# Patient Record
Sex: Female | Born: 1950 | Race: Black or African American | Hispanic: No | Marital: Single | Smoking: Never smoker
Health system: Southern US, Community
[De-identification: ages and names within clinical notes are randomized; demographics above are authoritative.]

## PROBLEM LIST (undated history)

## (undated) DIAGNOSIS — E119 Type 2 diabetes mellitus without complications: Secondary | ICD-10-CM

## (undated) DIAGNOSIS — I10 Essential (primary) hypertension: Secondary | ICD-10-CM

## (undated) DIAGNOSIS — I639 Cerebral infarction, unspecified: Secondary | ICD-10-CM

## (undated) DIAGNOSIS — J45909 Unspecified asthma, uncomplicated: Secondary | ICD-10-CM

## (undated) DIAGNOSIS — E785 Hyperlipidemia, unspecified: Secondary | ICD-10-CM

## (undated) HISTORY — PX: POLYPECTOMY: SHX149

## (undated) HISTORY — PX: LEG SURGERY: SHX1003

## (undated) HISTORY — DX: Hyperlipidemia, unspecified: E78.5

## (undated) HISTORY — PX: COLONOSCOPY: SHX174

## (undated) HISTORY — DX: Unspecified asthma, uncomplicated: J45.909

## (undated) HISTORY — DX: Type 2 diabetes mellitus without complications: E11.9

---

## 2011-11-13 DIAGNOSIS — I639 Cerebral infarction, unspecified: Secondary | ICD-10-CM

## 2011-11-13 HISTORY — DX: Cerebral infarction, unspecified: I63.9

## 2012-05-07 ENCOUNTER — Emergency Department (HOSPITAL_COMMUNITY): Payer: Self-pay

## 2012-05-07 ENCOUNTER — Encounter (HOSPITAL_COMMUNITY): Payer: Self-pay | Admitting: *Deleted

## 2012-05-07 DIAGNOSIS — Z8673 Personal history of transient ischemic attack (TIA), and cerebral infarction without residual deficits: Secondary | ICD-10-CM | POA: Insufficient documentation

## 2012-05-07 DIAGNOSIS — R1310 Dysphagia, unspecified: Secondary | ICD-10-CM | POA: Insufficient documentation

## 2012-05-07 DIAGNOSIS — I10 Essential (primary) hypertension: Secondary | ICD-10-CM | POA: Insufficient documentation

## 2012-05-07 DIAGNOSIS — R51 Headache: Secondary | ICD-10-CM | POA: Insufficient documentation

## 2012-05-07 DIAGNOSIS — R0602 Shortness of breath: Secondary | ICD-10-CM | POA: Insufficient documentation

## 2012-05-07 DIAGNOSIS — R059 Cough, unspecified: Secondary | ICD-10-CM | POA: Insufficient documentation

## 2012-05-07 DIAGNOSIS — R05 Cough: Secondary | ICD-10-CM | POA: Insufficient documentation

## 2012-05-07 NOTE — ED Notes (Signed)
Lung sounds diminished, no wheezing heard, no hx of asthma.

## 2012-05-07 NOTE — ED Notes (Signed)
Pt c/o SOB and son states pt had a "stroke on Sunday" and was discharged yesterday.  Pt c/o difficulty swallowing, coughing with drinking/eating which pt states she had in hospital as well.  From stroke, some slurring of speech and left sided facial droop, this is not new-occurring since Sunday.  Alert and oriented x 3.

## 2012-05-08 ENCOUNTER — Emergency Department (HOSPITAL_COMMUNITY)
Admission: EM | Admit: 2012-05-08 | Discharge: 2012-05-08 | Disposition: A | Discharge status: Home / Self Care | Payer: Self-pay | Attending: Emergency Medicine | Admitting: Emergency Medicine

## 2012-05-08 ENCOUNTER — Emergency Department (HOSPITAL_COMMUNITY): Payer: Self-pay

## 2012-05-08 DIAGNOSIS — Z8673 Personal history of transient ischemic attack (TIA), and cerebral infarction without residual deficits: Secondary | ICD-10-CM

## 2012-05-08 DIAGNOSIS — T17998A Other foreign object in respiratory tract, part unspecified causing other injury, initial encounter: Secondary | ICD-10-CM

## 2012-05-08 HISTORY — DX: Cerebral infarction, unspecified: I63.9

## 2012-05-08 HISTORY — DX: Essential (primary) hypertension: I10

## 2012-05-08 LAB — BASIC METABOLIC PANEL
BUN: 22 mg/dL (ref 6–23)
Calcium: 9.7 mg/dL (ref 8.4–10.5)
Creatinine, Ser: 0.86 mg/dL (ref 0.50–1.10)
GFR calc non Af Amer: 71 mL/min — ABNORMAL LOW (ref >90)
Glucose, Bld: 95 mg/dL (ref 70–99)

## 2012-05-08 LAB — CBC
HCT: 40.7 % (ref 36.0–46.0)
Hemoglobin: 14 g/dL (ref 12.0–15.0)
MCH: 28 pg (ref 26.0–34.0)
MCHC: 34.4 g/dL (ref 30.0–36.0)

## 2012-05-08 MED ORDER — ALBUTEROL SULFATE HFA 108 (90 BASE) MCG/ACT IN AERS
2.0000 | INHALATION_SPRAY | RESPIRATORY_TRACT | Status: DC | PRN
  Administered 2012-05-08: 2 via RESPIRATORY_TRACT
  Filled 2012-05-08: qty 6.7

## 2012-05-08 NOTE — Discharge Instructions (Signed)
Aspiration Precautions Start thickened liquid diet as discussed. Followup and primary care and neurology office as discussed. Continue medications as prescribed. Return here or be evaluated sooner for any fevers trouble breathing or any worsening condition.   Aspiration is the inhaling of a liquid or object into the lungs. Things that can be inhaled into the lungs include:  Food.   Any type of liquid, such as drinks or saliva.   Stomach contents, such as vomit or stomach acid.  When these things go into the lungs, damage can occur. Serious complications can then result, such as:  A lung infection (pneumonia).   A collection of pus in the lungs (lung abscess).  CAUSES  A decreased level of awareness (consciousness) due to:   Traumatic brain injury or head injury.   Stroke.   Neurological disease.   Seizures.   Decreased or absent gag reflex (inability to cough).   Medical conditions that affect swallowing.   Conditions that affect the food pipe (esophagus) such as a narrowing of the esophagus (esophageal stricture).   Gastroesophageal reflux (GERD). This is also known as acid reflux.   Any type of surgery where you are put under general anesthesia or have sedation.   Drinking large amounts of alcohol.   Taking medication that causes drowsiness, confusion, or weakness.   Aging.   Dental problems.   Having a feeding tube.  SYMPTOMS When aspiration occurs, different signs and symptoms can occur, such as:  Coughing (if a person has a cough or gag reflex) after swallowing food or liquids.   Difficulty breathing. This can include things like:   Breathing rapidly.   Breathing very slowly.   Hearing "gurgling" lung sounds when a person breaths.   Coughing up phlegm (sputum) that is:   Yellow, tan, or green in color.   Has pieces of food in it.   Bad smelling.   A change in voice (hoarseness).   A change in skin color. The skin may turn a "bluish" type color  because of a lack of oxygen (cyanosis).   Fever.  DIAGNOSIS  A chest X-ray may be performed. This takes a picture of your lungs. It can show changes in the lungs if aspiration has occurred.   A bronchoscopy may be performed. This is a surgical procedure in which a thin, flexible tube with a camera at the end is inserted into the nose or mouth. The tube is advanced to the lungs so your caregiver can view the lungs and obtain a culture, tissue sample, or remove an aspirated object.   A swallowing evaluation study may be performed to evaluate:   A person's risk of aspiration.   How difficult it is for a person to swallow.   What types of foods are safe for a person to eat.  PREVENTION If you are a caregiver to someone who may aspirate, follow the directions below. If you are caring for someone who can eat and drink through their mouth:  Have them sit in an upright position when eating food or drinking fluids, such as:   Sitting up in a chair.   If sitting in a chair is not possible, position the person in bed so they are upright.   Remind the person to eat slowly and chew well.   Do not distract the person. This is especially important for people with thinking or memory (cognitive) problems.   Check the person's mouth for leftover food after eating.   Keep the person sitting  upright for 30 to 45 minutes after eating.   Do not serve food or drink for at least 2 hours before bedtime.  If you are caring for someone with a feeding tube and he or she cannot eat or drink through their mouth:  Keep the person in an upright position as much as possible.   Do not  lay the person flat if they are getting continuous feedings. Turn the feeding pump off if you need to lay the person flat for any reason.   Check feeding tube residuals as directed by your caregiver. If a large amount of tube feedings are pulled back (aspirated) from the feeding tube, call your caregiver right away.  General  guidelines to prevent aspiration include:  Feed small amounts of food. Do not force feed.   Use as little water as possible when brushing the person's teeth or cleaning his or her mouth.   Provide oral care before and after meals.   Never put food or fluids in the mouth of a person who is not fully alert.   Crush pills and put them in soft food such as pudding or ice cream. Some pills should not be crushed. Check with your caregiver before crushing any medication.  SEEK IMMEDIATE MEDICAL CARE IF:   The person has trouble breathing or starts to breathe rapidly.   The person is breathing very slowly or stops breathing.   The person coughs a lot after eating or drinking.   The person has a chronic cough.   The person coughs up thick, yellow, or tan sputum.   The person has a fever or persistent symptoms for more than 72 hours.   The person has a fever and their symptoms suddenly get worse.  Document Released: 12/01/2010 Document Revised: 10/18/2011 Document Reviewed: 12/01/2010 [facility] Patient Information 2012 [place], [place].Aspiration Precautions Aspiration is the inhaling of a liquid or object into the lungs. Things that can be inhaled into the lungs include:  Food.   Any type of liquid, such as drinks or saliva.   Stomach contents, such as vomit or stomach acid.  When these things go into the lungs, damage can occur. Serious complications can then result, such as:  A lung infection (pneumonia).   A collection of pus in the lungs (lung abscess).  CAUSES  A decreased level of awareness (consciousness) due to:   Traumatic brain injury or head injury.   Stroke.   Neurological disease.   Seizures.   Decreased or absent gag reflex (inability to cough).   Medical conditions that affect swallowing.   Conditions that affect the food pipe (esophagus) such as a narrowing of the esophagus (esophageal stricture).   Gastroesophageal reflux (GERD). This is also known as  acid reflux.   Any type of surgery where you are put under general anesthesia or have sedation.   Drinking large amounts of alcohol.   Taking medication that causes drowsiness, confusion, or weakness.   Aging.   Dental problems.   Having a feeding tube.  SYMPTOMS When aspiration occurs, different signs and symptoms can occur, such as:  Coughing (if a person has a cough or gag reflex) after swallowing food or liquids.   Difficulty breathing. This can include things like:   Breathing rapidly.   Breathing very slowly.   Hearing "gurgling" lung sounds when a person breaths.   Coughing up phlegm (sputum) that is:   Yellow, tan, or green in color.   Has pieces of food in it.  Bad smelling.   A change in voice (hoarseness).   A change in skin color. The skin may turn a "bluish" type color because of a lack of oxygen (cyanosis).   Fever.  DIAGNOSIS  A chest X-ray may be performed. This takes a picture of your lungs. It can show changes in the lungs if aspiration has occurred.   A bronchoscopy may be performed. This is a surgical procedure in which a thin, flexible tube with a camera at the end is inserted into the nose or mouth. The tube is advanced to the lungs so your caregiver can view the lungs and obtain a culture, tissue sample, or remove an aspirated object.   A swallowing evaluation study may be performed to evaluate:   A person's risk of aspiration.   How difficult it is for a person to swallow.   What types of foods are safe for a person to eat.  PREVENTION If you are a caregiver to someone who may aspirate, follow the directions below. If you are caring for someone who can eat and drink through their mouth:  Have them sit in an upright position when eating food or drinking fluids, such as:   Sitting up in a chair.   If sitting in a chair is not possible, position the person in bed so they are upright.   Remind the person to eat slowly and chew well.     Do not distract the person. This is especially important for people with thinking or memory (cognitive) problems.   Check the person's mouth for leftover food after eating.   Keep the person sitting upright for 30 to 45 minutes after eating.   Do not serve food or drink for at least 2 hours before bedtime.  If you are caring for someone with a feeding tube and he or she cannot eat or drink through their mouth:  Keep the person in an upright position as much as possible.   Do not  lay the person flat if they are getting continuous feedings. Turn the feeding pump off if you need to lay the person flat for any reason.   Check feeding tube residuals as directed by your caregiver. If a large amount of tube feedings are pulled back (aspirated) from the feeding tube, call your caregiver right away.  General guidelines to prevent aspiration include:  Feed small amounts of food. Do not force feed.   Use as little water as possible when brushing the person's teeth or cleaning his or her mouth.   Provide oral care before and after meals.   Never put food or fluids in the mouth of a person who is not fully alert.   Crush pills and put them in soft food such as pudding or ice cream. Some pills should not be crushed. Check with your caregiver before crushing any medication.  SEEK IMMEDIATE MEDICAL CARE IF:   The person has trouble breathing or starts to breathe rapidly.   The person is breathing very slowly or stops breathing.   The person coughs a lot after eating or drinking.   The person has a chronic cough.   The person coughs up thick, yellow, or tan sputum.   The person has a fever or persistent symptoms for more than 72 hours.   The person has a fever and their symptoms suddenly get worse.  Document Released: 12/01/2010 Document Revised: 10/18/2011 Document Reviewed: 12/01/2010 [facility] Patient Information 2012 [place], [place].
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
## 2012-05-08 NOTE — ED Notes (Signed)
D/c instructions reviewed w/ pt and family - pt and family deny any further questions or concerns at present. Pt ambulating independently w/ steady gait on d/c in no acute distress, A&Ox4.  

## 2012-05-11 ENCOUNTER — Emergency Department (HOSPITAL_BASED_OUTPATIENT_CLINIC_OR_DEPARTMENT_OTHER)
Admission: EM | Admit: 2012-05-11 | Discharge: 2012-05-11 | Disposition: A | Discharge status: Home / Self Care | Payer: Self-pay | Attending: Emergency Medicine | Admitting: Emergency Medicine

## 2012-05-11 ENCOUNTER — Encounter (HOSPITAL_BASED_OUTPATIENT_CLINIC_OR_DEPARTMENT_OTHER): Payer: Self-pay | Admitting: *Deleted

## 2012-05-11 ENCOUNTER — Emergency Department (HOSPITAL_BASED_OUTPATIENT_CLINIC_OR_DEPARTMENT_OTHER): Payer: Self-pay

## 2012-05-11 DIAGNOSIS — R062 Wheezing: Secondary | ICD-10-CM | POA: Insufficient documentation

## 2012-05-11 DIAGNOSIS — I10 Essential (primary) hypertension: Secondary | ICD-10-CM | POA: Insufficient documentation

## 2012-05-11 DIAGNOSIS — Z8673 Personal history of transient ischemic attack (TIA), and cerebral infarction without residual deficits: Secondary | ICD-10-CM | POA: Insufficient documentation

## 2012-05-11 LAB — PRO B NATRIURETIC PEPTIDE: Pro B Natriuretic peptide (BNP): 100.5 pg/mL (ref 0–125)

## 2012-05-11 LAB — COMPREHENSIVE METABOLIC PANEL
Albumin: 4.2 g/dL (ref 3.5–5.2)
Alkaline Phosphatase: 79 U/L (ref 39–117)
BUN: 11 mg/dL (ref 6–23)
Calcium: 9.2 mg/dL (ref 8.4–10.5)
Potassium: 4 mEq/L (ref 3.5–5.1)
Sodium: 138 mEq/L (ref 135–145)
Total Protein: 8 g/dL (ref 6.0–8.3)

## 2012-05-11 LAB — CBC WITH DIFFERENTIAL/PLATELET
Basophils Relative: 0 % (ref 0–1)
Eosinophils Absolute: 1.9 10*3/uL — ABNORMAL HIGH (ref 0.0–0.7)
MCH: 27.4 pg (ref 26.0–34.0)
MCHC: 34 g/dL (ref 30.0–36.0)
Neutrophils Relative %: 51 % (ref 43–77)
Platelets: 217 10*3/uL (ref 150–400)

## 2012-05-11 MED ORDER — IPRATROPIUM BROMIDE 0.02 % IN SOLN
0.5000 mg | Freq: Once | RESPIRATORY_TRACT | Status: AC
  Administered 2012-05-11: 0.5 mg via RESPIRATORY_TRACT

## 2012-05-11 MED ORDER — ACETAMINOPHEN 325 MG PO TABS
650.0000 mg | ORAL_TABLET | Freq: Once | ORAL | Status: AC
  Administered 2012-05-11: 650 mg via ORAL

## 2012-05-11 MED ORDER — ALBUTEROL SULFATE (5 MG/ML) 0.5% IN NEBU
5.0000 mg | INHALATION_SOLUTION | Freq: Once | RESPIRATORY_TRACT | Status: AC
  Administered 2012-05-11: 5 mg via RESPIRATORY_TRACT

## 2012-05-11 MED ORDER — ALBUTEROL SULFATE HFA 108 (90 BASE) MCG/ACT IN AERS
INHALATION_SPRAY | RESPIRATORY_TRACT | Status: AC
  Administered 2012-05-11: 18:00:00
  Filled 2012-05-11: qty 6.7

## 2012-05-11 MED ORDER — IPRATROPIUM BROMIDE 0.02 % IN SOLN
RESPIRATORY_TRACT | Status: AC
  Filled 2012-05-11: qty 2.5

## 2012-05-11 MED ORDER — ALBUTEROL SULFATE (5 MG/ML) 0.5% IN NEBU
INHALATION_SOLUTION | RESPIRATORY_TRACT | Status: AC
  Administered 2012-05-11: 5 mg via RESPIRATORY_TRACT
  Filled 2012-05-11: qty 1

## 2012-05-11 MED ORDER — METHYLPREDNISOLONE SODIUM SUCC 125 MG IJ SOLR
125.0000 mg | Freq: Once | INTRAMUSCULAR | Status: AC
  Administered 2012-05-11: 125 mg via INTRAVENOUS
  Filled 2012-05-11: qty 2

## 2012-05-11 MED ORDER — ACETAMINOPHEN 325 MG PO TABS
ORAL_TABLET | ORAL | Status: AC
  Administered 2012-05-11: 650 mg via ORAL
  Filled 2012-05-11: qty 2

## 2012-05-11 MED ORDER — PREDNISONE 10 MG PO TABS: 20.0000 mg | ORAL_TABLET | Freq: Every day | ORAL | Status: DC

## 2012-05-11 NOTE — ED Notes (Signed)
Pt states she was dx'd with a CVA last Sunday. D/C'd Tuesday and having trouble breathing since. Presents in moderate resp distress. Audible wheezing. Taken to ED10. [name], RRT in and Dr. Rosalia Hammers advised and in to evaluate.

## 2012-05-11 NOTE — Discharge Instructions (Signed)
Bronchospasm  A bronchospasm is when the tubes that carry air in and out of your lungs (bronchioles) become smaller. It is hard to breathe when this happens. A bronchospasm can be caused by:   Asthma.   Allergies.   Lung infection.  HOME CARE    Do not  smoke. Avoid places that have secondhand smoke.   Dust your house often. Have your air ducts cleaned once or twice a year.   Find out what allergies may cause your bronchospasms.   Use your inhaler properly if you have one. Know when to use it.   Eat healthy foods and drink plenty of water.   Only take medicine as told by your doctor.  GET HELP RIGHT AWAY IF:   You feel you cannot breathe or catch your breath.   You cannot stop coughing.   Your treatment is not helping you breathe better.  MAKE SURE YOU:    Understand these instructions.   Will watch your condition.   Will get help right away if you are not doing well or get worse.  Document Released: 08/26/2009 Document Revised: 10/18/2011 Document Reviewed: 08/26/2009  [employer] Patient Information 2012 [employer].
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
## 2012-05-14 ENCOUNTER — Encounter: Payer: Self-pay | Admitting: Family

## 2012-05-14 ENCOUNTER — Ambulatory Visit (INDEPENDENT_AMBULATORY_CARE_PROVIDER_SITE_OTHER): Payer: Self-pay | Admitting: Family

## 2012-05-14 VITALS — BP 156/100 | HR 86 | Temp 97.9°F | Resp 16 | Ht 62.5 in | Wt 205.1 lb

## 2012-05-14 DIAGNOSIS — I10 Essential (primary) hypertension: Secondary | ICD-10-CM

## 2012-05-14 DIAGNOSIS — Z8673 Personal history of transient ischemic attack (TIA), and cerebral infarction without residual deficits: Secondary | ICD-10-CM

## 2012-05-14 DIAGNOSIS — R05 Cough: Secondary | ICD-10-CM | POA: Insufficient documentation

## 2012-05-14 MED ORDER — OLMESARTAN MEDOXOMIL 20 MG PO TABS: 20.0000 mg | ORAL_TABLET | Freq: Every day | ORAL | Status: DC

## 2012-05-14 MED ORDER — FAMOTIDINE 20 MG PO TABS: 20.0000 mg | ORAL_TABLET | Freq: Two times a day (BID) | ORAL | Status: DC

## 2012-05-14 NOTE — Assessment & Plan Note (Signed)
Pt has persistent dysarthria and mild left sided facial weakness. She reports extensive work up (will request hospitalization records), which she tells me included a formal swallow evaluation.  She tells me that she passed the swallow test prior to discharge.  Since returning home, she reports getting "choked up on solids."  Will refer to speech therapy for re-evaluation.  In the meantime, I have advised her and the daughter to thicken liquids with thick-it to a nectar consistence and to eat soft foods.  Continue plavix.

## 2012-05-14 NOTE — Patient Instructions (Addendum)
You will be contacted about your referral to neurology and speech therapy. Follow up in 2-3 weeks.
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
## 2012-05-14 NOTE — Assessment & Plan Note (Signed)
Uncontrolled.  Will d/c ace due to cough.  Start Benicar 20mg - samples provided.  Will plan to check bmet next visit.  Obtain lab work from recent hospitalization. She is planning to apply for disability and medicare which hopefully can offset some of her medical costs as she is currently uninsured.

## 2012-05-14 NOTE — Assessment & Plan Note (Addendum)
Likely multifactorial.  I think she is having trouble clearing some of the upper secretions from her airway since the stroke.  Also, I suspect associated ACE cough.  Will add empiric pepcid to see if this helps with cough.  Continue prednisone and prn albuterol.

## 2012-05-16 ENCOUNTER — Other Ambulatory Visit (HOSPITAL_COMMUNITY): Payer: Self-pay | Admitting: Family

## 2012-05-16 ENCOUNTER — Telehealth: Payer: Self-pay | Admitting: Family

## 2012-05-16 DIAGNOSIS — R1310 Dysphagia, unspecified: Secondary | ICD-10-CM

## 2012-05-16 DIAGNOSIS — R05 Cough: Secondary | ICD-10-CM

## 2012-05-16 NOTE — Telephone Encounter (Signed)
Received medical records from [facility])  P: [phone] ext. [phone] F: [phone]

## 2012-05-22 ENCOUNTER — Ambulatory Visit (HOSPITAL_COMMUNITY)
Admission: RE | Admit: 2012-05-22 | Discharge: 2012-05-22 | Disposition: A | Discharge status: Home / Self Care | Payer: Self-pay | Source: Ambulatory Visit | Attending: Family | Admitting: Family

## 2012-05-22 ENCOUNTER — Telehealth: Payer: Self-pay | Admitting: Family

## 2012-05-22 DIAGNOSIS — I10 Essential (primary) hypertension: Secondary | ICD-10-CM | POA: Insufficient documentation

## 2012-05-22 DIAGNOSIS — R05 Cough: Secondary | ICD-10-CM

## 2012-05-22 DIAGNOSIS — R059 Cough, unspecified: Secondary | ICD-10-CM | POA: Insufficient documentation

## 2012-05-22 DIAGNOSIS — Z8673 Personal history of transient ischemic attack (TIA), and cerebral infarction without residual deficits: Secondary | ICD-10-CM

## 2012-05-22 DIAGNOSIS — I69991 Dysphagia following unspecified cerebrovascular disease: Secondary | ICD-10-CM | POA: Insufficient documentation

## 2012-05-22 DIAGNOSIS — R1311 Dysphagia, oral phase: Secondary | ICD-10-CM | POA: Insufficient documentation

## 2012-05-22 DIAGNOSIS — R1310 Dysphagia, unspecified: Secondary | ICD-10-CM
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
## 2012-05-22 NOTE — Telephone Encounter (Signed)
Pls call pt and let her know that her swallow test shows some mild swallowing difficulty.  The speech therapist has recommended the following, which they may have already reviewed with the pt:  Diet Recommendation Dysphagia 2 (Fine chop);Thin liquid  Liquid Administration via: Cup;No straw  Medication Administration: Whole meds with puree  Supervision: Patient able to self feed  Compensations: Slow rate;Small sips/bites  Postural Changes and/or Swallow Maneuvers: Seated upright 90 degrees

## 2012-05-26 NOTE — Telephone Encounter (Signed)
Left message to have pt return my call. 

## 2012-05-28 ENCOUNTER — Encounter: Payer: Self-pay | Admitting: Family

## 2012-05-28 ENCOUNTER — Ambulatory Visit (INDEPENDENT_AMBULATORY_CARE_PROVIDER_SITE_OTHER): Payer: Self-pay | Admitting: Family

## 2012-05-28 VITALS — BP 132/84 | HR 84 | Temp 98.1°F | Resp 16 | Ht 62.5 in | Wt 203.0 lb

## 2012-05-28 DIAGNOSIS — I10 Essential (primary) hypertension: Secondary | ICD-10-CM

## 2012-05-28 DIAGNOSIS — R1310 Dysphagia, unspecified: Secondary | ICD-10-CM

## 2012-05-28 DIAGNOSIS — Z8673 Personal history of transient ischemic attack (TIA), and cerebral infarction without residual deficits: Secondary | ICD-10-CM

## 2012-05-28 LAB — BASIC METABOLIC PANEL
BUN: 22 mg/dL (ref 6–23)
Potassium: 4.3 mEq/L (ref 3.5–5.3)
Sodium: 138 mEq/L (ref 135–145)

## 2012-05-28 NOTE — Patient Instructions (Addendum)
Please complete your blood work prior to leaving.  You will be contact about your referral to speech therapy and neurology. Please let us know if you have not heard back within 1 week about your referral. Please schedule a follow up appointment in 3 months.    Dysphagia diet-  Diet Recommendation Dysphagia 2 (Fine chop);Thin liquid  Liquid Administration via: Cup;No straw  Medication Administration: Whole meds with puree  Supervision: Patient able to self feed  Compensations: Slow rate;Small sips/bites  Postural Changes and/or Swallow Maneuvers: Seated upright 90 degrees
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
## 2012-05-28 NOTE — Assessment & Plan Note (Signed)
Clinically stable. Awaiting records.  I would like to see what her lipids look like to determine if she needs a statin. Continue plavix. Neuro consult pending.

## 2012-05-28 NOTE — Assessment & Plan Note (Signed)
Improved on Beniar.  #28 samples provided today.  Obtain BMET. Will initiate patient assistance.

## 2012-05-28 NOTE — Assessment & Plan Note (Signed)
She is tolerating dysphagia diet.  She is to start outpatient Speech Therapy.

## 2012-05-29 ENCOUNTER — Telehealth: Payer: Self-pay | Admitting: Family

## 2012-05-29 NOTE — Telephone Encounter (Signed)
Message copied by Sandford Craze on Thu May 29, 2012  3:12 PM ------      Message from: Darral Dash E      Created: Thu May 29, 2012  9:25 AM          Appt  :  [facility] . Dr Marjory Lies   July   23 @ 10            ----- Message -----         From: Sandford Craze, NP         Sent: 05/28/2012  11:26 AM           To: Keith Rake Aheron            Also, could you pls check on status of neurology referral.  thanks

## 2012-05-29 NOTE — Telephone Encounter (Signed)
Pt was notified at her f/u on 05/28/12.

## 2012-06-02 ENCOUNTER — Telehealth: Payer: Self-pay | Admitting: Family

## 2012-06-02 NOTE — Telephone Encounter (Signed)
Message copied by Sandford Craze on Mon Jun 02, 2012  1:01 PM ------      Message from: Eulah Pont      Created: Fri May 30, 2012  8:28 AM         Left message on home phone for call back      ----- Message -----         From: Sandford Craze, NP         Sent: 05/29/2012   3:10 PM           To: Keith Rake Aheron            Could you pls make sure that pt is aware of this apt? thanks      ----- Message -----         From: Eulah Pont         Sent: 05/29/2012   9:25 AM           To: Sandford Craze, NP               Appt  :  [facility] . Dr Marjory Lies   July   23 @ 10            ----- Message -----         From: Sandford Craze, NP         Sent: 05/28/2012  11:26 AM           To: Keith Rake Aheron            Also, could you pls check on status of neurology referral.  thanks

## 2012-06-18 ENCOUNTER — Telehealth: Payer: Self-pay | Admitting: *Deleted

## 2012-06-18 NOTE — Telephone Encounter (Signed)
Pt's daughter, Raquel dropped off FMLA paperwork to be completed on behalf of pt's recent stroke. Forms forwarded to Provider for completion/signature.

## 2012-06-19 ENCOUNTER — Ambulatory Visit: Payer: Self-pay | Attending: Family

## 2012-06-19 DIAGNOSIS — I69922 Dysarthria following unspecified cerebrovascular disease: Secondary | ICD-10-CM | POA: Insufficient documentation

## 2012-06-19 DIAGNOSIS — R1311 Dysphagia, oral phase: Secondary | ICD-10-CM | POA: Insufficient documentation

## 2012-06-19 DIAGNOSIS — I69991 Dysphagia following unspecified cerebrovascular disease: Secondary | ICD-10-CM | POA: Insufficient documentation

## 2012-06-19 DIAGNOSIS — IMO0001 Reserved for inherently not codable concepts without codable children: Secondary | ICD-10-CM | POA: Insufficient documentation

## 2012-06-20 ENCOUNTER — Telehealth: Payer: Self-pay | Admitting: *Deleted

## 2012-06-20 NOTE — Telephone Encounter (Signed)
Message copied by Kathi Simpers on Fri Jun 20, 2012  9:34 AM ------      Message from: O'SULLIVAN, MELISSA      Created: Wed May 28, 2012 11:23 AM       Nicki [place],            Could you pls initiate patient assist for benicar?            Thanks

## 2012-06-20 NOTE — Telephone Encounter (Signed)
Pt Assistance Form printed for Benicar and forwarded to Provider for signature. If patient qualifies, application is good for 1 yr. 90 day supply of medication will be shipped to our office. Refills will have to be requested by [place] via telephone @ [phone]. 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

## 2012-06-23 ENCOUNTER — Ambulatory Visit: Payer: Self-pay | Admitting: *Deleted

## 2012-06-23 NOTE — Telephone Encounter (Signed)
Completed application given to pt to complete her portion and attach financial documentation. Pt will mail forms.

## 2012-06-23 NOTE — Telephone Encounter (Signed)
FMLA forms completed and given to pt. Copy sent for scanning.

## 2012-06-23 NOTE — Telephone Encounter (Signed)
Signed.

## 2012-06-26 ENCOUNTER — Ambulatory Visit: Payer: Self-pay

## 2012-07-01 ENCOUNTER — Ambulatory Visit: Payer: Self-pay | Admitting: *Deleted

## 2012-07-03 ENCOUNTER — Ambulatory Visit: Payer: Self-pay

## 2012-07-07 ENCOUNTER — Other Ambulatory Visit: Payer: Self-pay | Admitting: *Deleted

## 2012-07-07 MED ORDER — OLMESARTAN MEDOXOMIL 20 MG PO TABS: 20.0000 mg | ORAL_TABLET | Freq: Every day | ORAL | Status: DC

## 2012-07-07 NOTE — Telephone Encounter (Signed)
Pt's daughter called stating pt is out of benicar. She reports that the pt assistance forms have been completed and mailed. We are awaiting approval/denial status. Advised her we could provide samples until decision has been reached by pt assist. Advised Raquel we are out of 20mg  strength but will leave 40mg  tabs at front desk and pt will need to cut tablets in half. She voices understanding. 2 boxes left at the front desk.

## 2012-07-09 ENCOUNTER — Ambulatory Visit: Payer: Self-pay

## 2012-07-11 ENCOUNTER — Telehealth: Payer: Self-pay | Admitting: *Deleted

## 2012-07-11 ENCOUNTER — Ambulatory Visit: Payer: Self-pay

## 2012-07-11 NOTE — Telephone Encounter (Signed)
Received 90 day supply of Benicar. Notified Raquel that med will be available for pick up at the front desk on Tuesday and instructed her to call me 2-3 weeks before pt runs out so we can request a refill.   Mervin Kung, CMA 06/20/2012 10:10 AM Signed  Pt Assistance Form printed for Benicar and forwarded to Provider for signature. If patient qualifies, application is good for 1 yr. 90 day supply of medication will be shipped to our office. Refills will have to be requested by [place] via telephone @ [phone]. 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

## 2012-07-16 ENCOUNTER — Ambulatory Visit: Payer: Self-pay | Attending: Family | Admitting: Speech Pathology

## 2012-07-16 DIAGNOSIS — R1311 Dysphagia, oral phase: Secondary | ICD-10-CM | POA: Insufficient documentation

## 2012-07-16 DIAGNOSIS — I69991 Dysphagia following unspecified cerebrovascular disease: Secondary | ICD-10-CM | POA: Insufficient documentation

## 2012-07-16 DIAGNOSIS — I69922 Dysarthria following unspecified cerebrovascular disease: Secondary | ICD-10-CM | POA: Insufficient documentation

## 2012-07-16 DIAGNOSIS — IMO0001 Reserved for inherently not codable concepts without codable children: Secondary | ICD-10-CM | POA: Insufficient documentation

## 2012-07-21 ENCOUNTER — Ambulatory Visit: Payer: Self-pay

## 2012-07-23 ENCOUNTER — Ambulatory Visit: Payer: Self-pay | Admitting: Speech Pathology

## 2012-07-28 ENCOUNTER — Ambulatory Visit: Payer: Self-pay

## 2012-07-30 ENCOUNTER — Encounter: Payer: Self-pay | Admitting: Speech Pathology

## 2012-08-05 ENCOUNTER — Ambulatory Visit: Payer: Self-pay

## 2012-08-19 ENCOUNTER — Ambulatory Visit: Payer: Self-pay | Attending: Family | Admitting: Speech Pathology

## 2012-08-19 DIAGNOSIS — I69922 Dysarthria following unspecified cerebrovascular disease: Secondary | ICD-10-CM | POA: Insufficient documentation

## 2012-08-19 DIAGNOSIS — I69991 Dysphagia following unspecified cerebrovascular disease: Secondary | ICD-10-CM | POA: Insufficient documentation

## 2012-08-19 DIAGNOSIS — IMO0001 Reserved for inherently not codable concepts without codable children: Secondary | ICD-10-CM | POA: Insufficient documentation

## 2012-08-19 DIAGNOSIS — R1311 Dysphagia, oral phase: Secondary | ICD-10-CM | POA: Insufficient documentation

## 2012-08-25 ENCOUNTER — Encounter: Payer: Self-pay | Admitting: Family

## 2012-08-25 ENCOUNTER — Telehealth: Payer: Self-pay | Admitting: *Deleted

## 2012-08-25 ENCOUNTER — Ambulatory Visit (INDEPENDENT_AMBULATORY_CARE_PROVIDER_SITE_OTHER): Payer: Self-pay | Admitting: Family

## 2012-08-25 VITALS — BP 130/98 | HR 76 | Temp 98.2°F | Resp 20 | Wt 200.0 lb

## 2012-08-25 DIAGNOSIS — R1310 Dysphagia, unspecified: Secondary | ICD-10-CM

## 2012-08-25 DIAGNOSIS — E785 Hyperlipidemia, unspecified: Secondary | ICD-10-CM

## 2012-08-25 DIAGNOSIS — Z8673 Personal history of transient ischemic attack (TIA), and cerebral infarction without residual deficits: Secondary | ICD-10-CM

## 2012-08-25 DIAGNOSIS — I10 Essential (primary) hypertension: Secondary | ICD-10-CM

## 2012-08-25 MED ORDER — METOPROLOL TARTRATE 25 MG PO TABS: 25.0000 mg | ORAL_TABLET | Freq: Two times a day (BID) | ORAL | Status: DC

## 2012-08-25 MED ORDER — SIMVASTATIN 40 MG PO TABS: 40.0000 mg | ORAL_TABLET | Freq: Every day | ORAL | Status: DC

## 2012-08-25 NOTE — Assessment & Plan Note (Signed)
Pt appears clinically stable.  Continue Plavix.  Per admission data from [facility] 6/13:  2-D echo 6/13 LVH, LVEF 64% non-fasting sugar was 103 total cholesterol 246, LDL 176 CT head- small vessel ischemic changes Carotids- no significant plaquing.

## 2012-08-25 NOTE — Assessment & Plan Note (Signed)
BP Readings from Last 3 Encounters:  08/25/12 130/98  05/28/12 132/84  05/14/12 156/100   DBP remains elevated.  Will add metoprolol.  Continue losartan. They will check BP at home call us with readings.  Plan follow up in 6 weeks.

## 2012-08-25 NOTE — Assessment & Plan Note (Signed)
Add simvastatin.  Pt is instructed to call if she develops muscle pain.

## 2012-08-25 NOTE — Telephone Encounter (Signed)
Pt's daughter, Raquel called wanting to verify the Rx given to pt at today's visit. Returned call. Raquel wanted to know if pt should continue taking Benicar.  Spoke with Melissa and she said yes along with the metoprolol.

## 2012-08-25 NOTE — Patient Instructions (Addendum)
Please follow up in 6 weeks- come fasting to this appointment. Check BP once daily for 1 week and contact us with your readings.
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
## 2012-08-25 NOTE — Assessment & Plan Note (Signed)
She is to continue/complete speech therapy.  After this is complete, I think it is reasonable for her to return to work.

## 2012-08-27 ENCOUNTER — Ambulatory Visit: Payer: Self-pay | Admitting: Speech Pathology

## 2012-09-01 ENCOUNTER — Ambulatory Visit: Payer: Self-pay | Admitting: Speech Pathology

## 2012-09-03 ENCOUNTER — Telehealth: Payer: Self-pay | Admitting: Family

## 2012-09-03 NOTE — Telephone Encounter (Signed)
Notified Raquel and she voices understanding.

## 2012-09-03 NOTE — Telephone Encounter (Signed)
Patients daughter Raquel called with BP and pulse readings  08-26-12 131/92  Pulse  70 08-27-12 141/83  Pulse  71 08-28-12 136/84  Pulse 63 08-29-12 116/99  Pulse 74 08-30-12 144/83  Pulse 60 08-31-12 144/83  Pulse   60 09-01-12 146/83  Pulse 63 09-02-12 136/86  Pulse 76

## 2012-09-03 NOTE — Telephone Encounter (Signed)
I doubt that cough is related to metoprolol.  I recommend that she continue for now.  If fever, productive cough, fatigue or if no improvement in 1 week, please call us back.

## 2012-09-03 NOTE — Telephone Encounter (Signed)
OK, pls continue current meds.  No changes.

## 2012-09-03 NOTE — Telephone Encounter (Signed)
Notified pt's daughter, Raquel. She states that pt is coughing more since starting the metoprolol.  Please advise.

## 2012-09-11 ENCOUNTER — Ambulatory Visit: Payer: Self-pay | Admitting: Speech Pathology

## 2012-09-15 ENCOUNTER — Ambulatory Visit: Payer: Self-pay | Attending: Family | Admitting: Speech Pathology

## 2012-09-15 DIAGNOSIS — R1311 Dysphagia, oral phase: Secondary | ICD-10-CM | POA: Insufficient documentation

## 2012-09-15 DIAGNOSIS — I69991 Dysphagia following unspecified cerebrovascular disease: Secondary | ICD-10-CM | POA: Insufficient documentation

## 2012-09-15 DIAGNOSIS — IMO0001 Reserved for inherently not codable concepts without codable children: Secondary | ICD-10-CM | POA: Insufficient documentation

## 2012-09-15 DIAGNOSIS — I69922 Dysarthria following unspecified cerebrovascular disease: Secondary | ICD-10-CM | POA: Insufficient documentation

## 2012-09-22 ENCOUNTER — Ambulatory Visit: Payer: Self-pay | Admitting: Speech Pathology

## 2012-09-30 ENCOUNTER — Ambulatory Visit: Payer: Self-pay | Admitting: Speech Pathology

## 2012-10-06 ENCOUNTER — Ambulatory Visit (INDEPENDENT_AMBULATORY_CARE_PROVIDER_SITE_OTHER): Payer: Self-pay | Admitting: Family

## 2012-10-06 ENCOUNTER — Encounter: Payer: Self-pay | Admitting: Family

## 2012-10-06 VITALS — BP 140/86 | HR 82 | Temp 98.6°F | Resp 16 | Ht 62.5 in | Wt 200.1 lb

## 2012-10-06 DIAGNOSIS — R1310 Dysphagia, unspecified: Secondary | ICD-10-CM

## 2012-10-06 DIAGNOSIS — E785 Hyperlipidemia, unspecified: Secondary | ICD-10-CM

## 2012-10-06 DIAGNOSIS — I10 Essential (primary) hypertension: Secondary | ICD-10-CM

## 2012-10-06 DIAGNOSIS — Z8673 Personal history of transient ischemic attack (TIA), and cerebral infarction without residual deficits: Secondary | ICD-10-CM

## 2012-10-06 LAB — HEPATIC FUNCTION PANEL
AST: 17 U/L (ref 0–37)
Bilirubin, Direct: 0.1 mg/dL (ref 0.0–0.3)
Total Bilirubin: 0.4 mg/dL (ref 0.3–1.2)

## 2012-10-06 LAB — LIPID PANEL
Cholesterol: 176 mg/dL (ref 0–200)
Total CHOL/HDL Ratio: 3.4 Ratio
VLDL: 20 mg/dL (ref 0–40)

## 2012-10-06 MED ORDER — METOPROLOL SUCCINATE ER 25 MG PO TB24: 25.0000 mg | ORAL_TABLET | Freq: Every day | ORAL | Status: DC

## 2012-10-06 NOTE — Assessment & Plan Note (Signed)
BP Readings from Last 3 Encounters:  10/06/12 140/86  08/25/12 130/98  05/28/12 132/84   BP control is reasonable today.  I have changed her toprol to an xl version since she is only taking once a day and I have asked her to contact me with her bp readings in 1 week.

## 2012-10-06 NOTE — Assessment & Plan Note (Signed)
On statin, check flp today.
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
## 2012-10-06 NOTE — Patient Instructions (Addendum)
Please complete your blood work prior to leaving. Check blood pressure once a day for a week and call us with BP readings.  Follow up in 3 months.

## 2012-10-06 NOTE — Assessment & Plan Note (Signed)
Pt has done well and at this point I think it is reasonable for her to return to work.  A work release note was provided today.

## 2012-10-06 NOTE — Assessment & Plan Note (Signed)
Completed speech therapy. No complaints at this time.  Monitor.

## 2012-10-07 ENCOUNTER — Encounter: Payer: Self-pay | Admitting: Family

## 2012-10-07 ENCOUNTER — Telehealth: Payer: Self-pay | Admitting: Family

## 2012-10-07 DIAGNOSIS — E785 Hyperlipidemia, unspecified: Secondary | ICD-10-CM

## 2012-10-07 MED ORDER — SIMVASTATIN 80 MG PO TABS: 80.0000 mg | ORAL_TABLET | Freq: Every day | ORAL | Status: DC

## 2012-10-07 NOTE — Telephone Encounter (Signed)
Pls call pt and let her know that her LDL (bad cholesterol) is slightly above goal. I would like her to increase simvastatin from 40 mg to 80 mg and plan to repeat FLP/LFT (hyperlipidemia) in 3 months.

## 2012-10-08 NOTE — Telephone Encounter (Signed)
Informed pt's daughter, understood & agreed; future lab orders placed/SLS

## 2012-11-03 ENCOUNTER — Other Ambulatory Visit: Payer: Self-pay | Admitting: *Deleted

## 2012-11-03 MED ORDER — OLMESARTAN MEDOXOMIL 20 MG PO TABS: 20.0000 mg | ORAL_TABLET | Freq: Every day | ORAL | Status: DC

## 2012-11-03 NOTE — Telephone Encounter (Signed)
Received message from Raquel requesting refill of pt's benicar for a 90 day supply. Spoke with Raquel to determine which pharmacy to send refill to and she states that pt is receiving samples from pt assistance and has 1 week supply left. Advised Raquel that we have 2 weeks worth of benicar 20mg  samples that I will place at the front desk for pick up. Requested refill through automated refill line.  Medication will be shipped in 7-10 business days.

## 2012-11-14 NOTE — Telephone Encounter (Signed)
Received 90 tablets from pt assistance. Medication placed at front desk for pick up. Left detailed message on pt's cell# and to call if any questions.

## 2012-12-02 ENCOUNTER — Telehealth: Payer: Self-pay | Admitting: *Deleted

## 2012-12-02 NOTE — Telephone Encounter (Signed)
Received request from Disability Determination Services for records from 08/2012 to present. Request forwarded to [employer].

## 2012-12-09 ENCOUNTER — Telehealth: Payer: Self-pay | Admitting: *Deleted

## 2012-12-09 NOTE — Telephone Encounter (Signed)
Agree 

## 2012-12-09 NOTE — Telephone Encounter (Signed)
Received call from pt's daughter, Raquel requesting refill of cough pills as pt has run out and her cough has worsened. She states that pt has had cough for a year and thinks one of her BP medications is making it worse. Advised her that pt needs to be evaluated in the office. She voices understanding and was transferred to front office for appt.

## 2012-12-10 ENCOUNTER — Ambulatory Visit: Payer: Self-pay | Admitting: Family

## 2012-12-11 ENCOUNTER — Ambulatory Visit (INDEPENDENT_AMBULATORY_CARE_PROVIDER_SITE_OTHER): Payer: Self-pay | Admitting: Family

## 2012-12-11 ENCOUNTER — Ambulatory Visit (HOSPITAL_BASED_OUTPATIENT_CLINIC_OR_DEPARTMENT_OTHER)
Admission: RE | Admit: 2012-12-11 | Discharge: 2012-12-11 | Disposition: A | Discharge status: Home / Self Care | Payer: Self-pay | Source: Ambulatory Visit | Attending: Family | Admitting: Family

## 2012-12-11 ENCOUNTER — Encounter: Payer: Self-pay | Admitting: Family

## 2012-12-11 VITALS — BP 152/90 | HR 80 | Temp 98.6°F | Resp 20 | Wt 200.4 lb

## 2012-12-11 DIAGNOSIS — R05 Cough: Secondary | ICD-10-CM

## 2012-12-11 DIAGNOSIS — J189 Pneumonia, unspecified organism: Secondary | ICD-10-CM | POA: Insufficient documentation

## 2012-12-11 DIAGNOSIS — R059 Cough, unspecified: Secondary | ICD-10-CM

## 2012-12-11 DIAGNOSIS — J45901 Unspecified asthma with (acute) exacerbation: Secondary | ICD-10-CM

## 2012-12-11 DIAGNOSIS — R062 Wheezing: Secondary | ICD-10-CM | POA: Insufficient documentation

## 2012-12-11 DIAGNOSIS — J45909 Unspecified asthma, uncomplicated: Secondary | ICD-10-CM | POA: Insufficient documentation

## 2012-12-11 MED ORDER — ALBUTEROL SULFATE HFA 108 (90 BASE) MCG/ACT IN AERS: 2.0000 | INHALATION_SPRAY | Freq: Four times a day (QID) | RESPIRATORY_TRACT | Status: DC | PRN

## 2012-12-11 MED ORDER — PREDNISONE 10 MG PO TABS: ORAL_TABLET | ORAL | Status: DC
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
## 2012-12-11 NOTE — Patient Instructions (Addendum)
Please complete your chest x ray on the first floor.  Follow up in 1-2 weeks, sooner if symptoms worsen or if symptoms do not improve.

## 2012-12-11 NOTE — Assessment & Plan Note (Signed)
Symbicort sample provided for pt today.  Will also obtain baseline CXR.  Prednisone taper.  Albuterol MDI.  Follow up in 1-2 weeks.  If symptoms do not improve, may need to d/c arb or add PPI.

## 2012-12-23 ENCOUNTER — Encounter: Payer: Self-pay | Admitting: Family

## 2012-12-23 ENCOUNTER — Ambulatory Visit (INDEPENDENT_AMBULATORY_CARE_PROVIDER_SITE_OTHER): Payer: Self-pay | Admitting: Family

## 2012-12-23 VITALS — BP 124/80 | HR 87 | Temp 98.4°F | Resp 16 | Ht 62.5 in | Wt 202.0 lb

## 2012-12-23 DIAGNOSIS — R61 Generalized hyperhidrosis: Secondary | ICD-10-CM

## 2012-12-23 DIAGNOSIS — J45901 Unspecified asthma with (acute) exacerbation: Secondary | ICD-10-CM

## 2012-12-23 DIAGNOSIS — IMO0001 Reserved for inherently not codable concepts without codable children: Secondary | ICD-10-CM

## 2012-12-23 DIAGNOSIS — T679XXA Effect of heat and light, unspecified, initial encounter: Secondary | ICD-10-CM

## 2012-12-23 DIAGNOSIS — E785 Hyperlipidemia, unspecified: Secondary | ICD-10-CM

## 2012-12-23 DIAGNOSIS — R6889 Other general symptoms and signs: Secondary | ICD-10-CM

## 2012-12-23 DIAGNOSIS — I10 Essential (primary) hypertension: Secondary | ICD-10-CM

## 2012-12-23 MED ORDER — METOPROLOL SUCCINATE ER 25 MG PO TB24: 25.0000 mg | ORAL_TABLET | Freq: Every day | ORAL | Status: DC

## 2012-12-23 MED ORDER — BUDESONIDE-FORMOTEROL FUMARATE 160-4.5 MCG/ACT IN AERO: 2.0000 | INHALATION_SPRAY | Freq: Two times a day (BID) | RESPIRATORY_TRACT | Status: DC
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
## 2012-12-23 NOTE — Patient Instructions (Addendum)
Please complete your blood work prior to leaving.  Follow up in 3 months sooner if problems/concerns.

## 2012-12-24 ENCOUNTER — Encounter: Payer: Self-pay | Admitting: Family

## 2012-12-24 LAB — BASIC METABOLIC PANEL
BUN: 18 mg/dL (ref 6–23)
Calcium: 9.8 mg/dL (ref 8.4–10.5)
Creat: 0.91 mg/dL (ref 0.50–1.10)
Glucose, Bld: 83 mg/dL (ref 70–99)
Sodium: 141 mEq/L (ref 135–145)

## 2012-12-25 ENCOUNTER — Telehealth: Payer: Self-pay | Admitting: Family

## 2012-12-25 DIAGNOSIS — R6889 Other general symptoms and signs: Secondary | ICD-10-CM | POA: Insufficient documentation

## 2012-12-25 NOTE — Assessment & Plan Note (Signed)
Check tsh 

## 2012-12-25 NOTE — Assessment & Plan Note (Signed)
BP Readings from Last 3 Encounters:  12/23/12 124/80  12/11/12 152/90  10/06/12 140/86   bp stable on current meds. Cont same.

## 2012-12-25 NOTE — Assessment & Plan Note (Signed)
ldl 104, simvastatin increased in the end of November. Due fo follow up flp.tolerating statin.

## 2012-12-25 NOTE — Assessment & Plan Note (Signed)
Resolved cont prn albuterol.

## 2012-12-25 NOTE — Telephone Encounter (Signed)
pls let pt know that i reviewed her labs and she is due for flp.

## 2012-12-26 ENCOUNTER — Ambulatory Visit: Payer: Self-pay | Admitting: Family

## 2012-12-29 NOTE — Telephone Encounter (Signed)
Spoke with pt's daughter. She states she will try to arrange from someone to bring pt to the lab this week for fasting bloodwork.

## 2013-01-05 ENCOUNTER — Ambulatory Visit: Payer: Self-pay | Admitting: Family

## 2013-01-27 ENCOUNTER — Telehealth: Payer: Self-pay | Admitting: *Deleted

## 2013-01-27 MED ORDER — OLMESARTAN MEDOXOMIL 20 MG PO TABS: 20.0000 mg | ORAL_TABLET | Freq: Every day | ORAL | Status: DC

## 2013-01-27 NOTE — Telephone Encounter (Signed)
Pt left message requesting 90 day supply of benicar. Verified with pt's daughter to send Rx to [facility]. Rx sent, #90 x 1 refill.

## 2013-02-11 ENCOUNTER — Telehealth: Payer: Self-pay | Admitting: *Deleted

## 2013-02-11 NOTE — Telephone Encounter (Signed)
Received call from pt's daughter stating pt needs refill of Benicar through the pt assistance program. Pt approved with [employer], will call in refill.

## 2013-02-12 NOTE — Telephone Encounter (Signed)
Refill called to automated refill line at [facility] (Phone [phone]).  Med will be shipped to our office in 7-10 business days. Awaiting medication.

## 2013-02-19 NOTE — Telephone Encounter (Signed)
90 day supply received from pt assistance program. Left detailed message on pt's cell# that samples are ready for pick up at the front desk.

## 2013-03-24 ENCOUNTER — Ambulatory Visit: Payer: Self-pay | Admitting: Family

## 2013-04-29 ENCOUNTER — Ambulatory Visit (INDEPENDENT_AMBULATORY_CARE_PROVIDER_SITE_OTHER): Payer: Self-pay | Admitting: Family

## 2013-04-29 ENCOUNTER — Encounter: Payer: Self-pay | Admitting: Family

## 2013-04-29 VITALS — BP 148/86 | HR 73 | Temp 98.4°F | Wt 205.0 lb

## 2013-04-29 DIAGNOSIS — I10 Essential (primary) hypertension: Secondary | ICD-10-CM

## 2013-04-29 DIAGNOSIS — Z8673 Personal history of transient ischemic attack (TIA), and cerebral infarction without residual deficits: Secondary | ICD-10-CM

## 2013-04-29 DIAGNOSIS — E785 Hyperlipidemia, unspecified: Secondary | ICD-10-CM

## 2013-04-29 DIAGNOSIS — J45909 Unspecified asthma, uncomplicated: Secondary | ICD-10-CM

## 2013-04-29 DIAGNOSIS — J4530 Mild persistent asthma, uncomplicated: Secondary | ICD-10-CM

## 2013-04-29 MED ORDER — METOPROLOL SUCCINATE ER 25 MG PO TB24: 25.0000 mg | ORAL_TABLET | Freq: Every day | ORAL | Status: DC

## 2013-04-29 MED ORDER — ALBUTEROL SULFATE HFA 108 (90 BASE) MCG/ACT IN AERS: 2.0000 | INHALATION_SPRAY | Freq: Four times a day (QID) | RESPIRATORY_TRACT | Status: DC | PRN

## 2013-04-29 MED ORDER — SIMVASTATIN 80 MG PO TABS: 80.0000 mg | ORAL_TABLET | Freq: Every day | ORAL | Status: DC

## 2013-04-29 MED ORDER — BUDESONIDE-FORMOTEROL FUMARATE 160-4.5 MCG/ACT IN AERO: 2.0000 | INHALATION_SPRAY | Freq: Two times a day (BID) | RESPIRATORY_TRACT | Status: DC

## 2013-04-29 MED ORDER — ASPIRIN EC 325 MG PO TBEC: 325.0000 mg | DELAYED_RELEASE_TABLET | Freq: Every day | ORAL | Status: DC

## 2013-04-29 MED ORDER — CLOPIDOGREL BISULFATE 75 MG PO TABS: 75.0000 mg | ORAL_TABLET | Freq: Every day | ORAL | Status: DC
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
## 2013-04-29 NOTE — Assessment & Plan Note (Signed)
-   Resume simvastatin 

## 2013-04-29 NOTE — Assessment & Plan Note (Signed)
She recently obtained insurance. Resume plavix.

## 2013-04-29 NOTE — Patient Instructions (Addendum)
Please follow up in 3 months.  

## 2013-04-29 NOTE — Assessment & Plan Note (Signed)
Uncontrolled.  Resume toprol xl, continue benicar.

## 2013-04-29 NOTE — Assessment & Plan Note (Signed)
Uncontrolled. Resume symbicort and albuterol.

## 2013-06-26 ENCOUNTER — Telehealth: Payer: Self-pay | Admitting: *Deleted

## 2013-06-26 NOTE — Telephone Encounter (Signed)
Received call from pt's daughter requesting refill of Benicar through pt assistanct. Refill requested from automated refill line at [facility] (Phone [phone]). Med will be shipped to our office in 7-10 business days. Awaiting medication.

## 2013-07-02 MED ORDER — OLMESARTAN MEDOXOMIL 20 MG PO TABS: 20.0000 mg | ORAL_TABLET | Freq: Every day | ORAL | Status: DC

## 2013-07-02 NOTE — Telephone Encounter (Signed)
Benicar samples received from [facility]. Samples placed at front desk and pt's daughter notified.

## 2013-08-03 ENCOUNTER — Ambulatory Visit (INDEPENDENT_AMBULATORY_CARE_PROVIDER_SITE_OTHER): Payer: Self-pay | Admitting: Family

## 2013-08-03 VITALS — BP 140/80 | HR 75 | Temp 97.5°F | Wt 212.0 lb

## 2013-08-03 DIAGNOSIS — E785 Hyperlipidemia, unspecified: Secondary | ICD-10-CM

## 2013-08-03 DIAGNOSIS — I10 Essential (primary) hypertension: Secondary | ICD-10-CM

## 2013-08-03 DIAGNOSIS — Z8673 Personal history of transient ischemic attack (TIA), and cerebral infarction without residual deficits: Secondary | ICD-10-CM

## 2013-08-03 MED ORDER — OLMESARTAN MEDOXOMIL 20 MG PO TABS: 20.0000 mg | ORAL_TABLET | Freq: Every day | ORAL | Status: DC

## 2013-08-03 NOTE — Assessment & Plan Note (Signed)
Tolerating statin, fasting today.  Obtain flp/lft.
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
## 2013-08-03 NOTE — Assessment & Plan Note (Signed)
BP Readings from Last 3 Encounters:  08/03/13 140/80  04/29/13 148/86  12/23/12 124/80   BP OK.  Continue benicar, toprol xl.

## 2013-08-03 NOTE — Assessment & Plan Note (Signed)
Stable.  Continue to monitor lipids and BP. Continue plavix.

## 2013-08-11 ENCOUNTER — Encounter: Payer: Self-pay | Admitting: Family

## 2013-10-23 ENCOUNTER — Ambulatory Visit (INDEPENDENT_AMBULATORY_CARE_PROVIDER_SITE_OTHER): Payer: BC Managed Care – PPO | Admitting: Family

## 2013-10-23 ENCOUNTER — Ambulatory Visit (INDEPENDENT_AMBULATORY_CARE_PROVIDER_SITE_OTHER): Payer: BC Managed Care – PPO | Admitting: Family Medicine

## 2013-10-23 ENCOUNTER — Encounter: Payer: Self-pay | Admitting: Family

## 2013-10-23 VITALS — BP 144/90 | HR 84 | Temp 97.6°F | Resp 18 | Ht 62.5 in | Wt 223.1 lb

## 2013-10-23 VITALS — BP 144/90 | HR 84 | Temp 97.6°F | Resp 18 | Ht 62.5 in | Wt 223.0 lb

## 2013-10-23 DIAGNOSIS — E785 Hyperlipidemia, unspecified: Secondary | ICD-10-CM

## 2013-10-23 DIAGNOSIS — Z23 Encounter for immunization: Secondary | ICD-10-CM

## 2013-10-23 DIAGNOSIS — I10 Essential (primary) hypertension: Secondary | ICD-10-CM

## 2013-10-23 DIAGNOSIS — R635 Abnormal weight gain: Secondary | ICD-10-CM

## 2013-10-23 DIAGNOSIS — Z8673 Personal history of transient ischemic attack (TIA), and cerebral infarction without residual deficits: Secondary | ICD-10-CM

## 2013-10-23 DIAGNOSIS — J45909 Unspecified asthma, uncomplicated: Secondary | ICD-10-CM

## 2013-10-23 NOTE — Patient Instructions (Signed)
Please return fasting for lab work on Monday. Follow up in 3 weeks.

## 2013-10-23 NOTE — Progress Notes (Signed)
Pre visit review using our clinic review tool, if applicable. No additional management support is needed unless otherwise documented below in the visit note. 
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
## 2013-10-26 ENCOUNTER — Encounter: Payer: Self-pay | Admitting: Family

## 2013-10-26 ENCOUNTER — Telehealth: Payer: Self-pay | Admitting: Family

## 2013-10-26 ENCOUNTER — Other Ambulatory Visit: Payer: Self-pay | Admitting: *Deleted

## 2013-10-26 ENCOUNTER — Ambulatory Visit: Payer: Self-pay | Admitting: Family

## 2013-10-26 LAB — HEPATIC FUNCTION PANEL
Alkaline Phosphatase: 68 U/L (ref 39–117)
Bilirubin, Direct: 0.1 mg/dL (ref 0.0–0.3)
Indirect Bilirubin: 0.4 mg/dL (ref 0.0–0.9)
Total Bilirubin: 0.5 mg/dL (ref 0.3–1.2)

## 2013-10-26 LAB — BASIC METABOLIC PANEL
CO2: 25 mEq/L (ref 19–32)
Calcium: 9.4 mg/dL (ref 8.4–10.5)
Sodium: 139 mEq/L (ref 135–145)

## 2013-10-26 LAB — LIPID PANEL
HDL: 53 mg/dL (ref >39)
LDL Cholesterol: 101 mg/dL — ABNORMAL HIGH (ref 0–99)
Total CHOL/HDL Ratio: 3.2 Ratio

## 2013-10-26 MED ORDER — CLOPIDOGREL BISULFATE 75 MG PO TABS: 75.0000 mg | ORAL_TABLET | Freq: Every day | ORAL | Status: DC

## 2013-10-26 NOTE — Progress Notes (Signed)
Not seen by this provider

## 2013-10-26 NOTE — Progress Notes (Signed)
Rx printed and was faxed to pharmacy. 

## 2013-10-26 NOTE — Telephone Encounter (Signed)
Patient is requesting a refill of plavix to be sent to [facility] pharmacy

## 2013-10-26 NOTE — Telephone Encounter (Signed)
Plavix refill sent to pharmacy. [id number]

## 2013-10-27 DIAGNOSIS — R635 Abnormal weight gain: Secondary | ICD-10-CM | POA: Insufficient documentation

## 2013-10-27 NOTE — Telephone Encounter (Signed)
See lab letter re: lab results.  Please also call pt and let her know that I would like for her to increase the toprol xl from 25mg  once daily to 50 mg (2 tabs) once daily.  Does she have a BP cuff at home?  If so please check BP and HR once daily after increasing and phone [place] in 1 week with readings. If unable to check BP at home then please book 1 month follow up visit.

## 2013-10-27 NOTE — Assessment & Plan Note (Signed)
Stable off of symbicort.  Advised pt ok to remain off for now.

## 2013-10-27 NOTE — Assessment & Plan Note (Signed)
Stable. Continue plavix, lipid/bp optimization.

## 2013-10-27 NOTE — Telephone Encounter (Signed)
Left message on cell# to return my call. 

## 2013-10-27 NOTE — Assessment & Plan Note (Signed)
Likely due to extra caloric intake from ensure. Advised pt to d/c ensure, continue exercise, decrease portion size, more fresh fruits/veggies.

## 2013-10-27 NOTE — Assessment & Plan Note (Signed)
Lab Results  Component Value Date   CHOL 170 10/26/2013   HDL 53 10/26/2013   LDLCALC 101* 10/26/2013   TRIG 80 10/26/2013   CHOLHDL 3.2 10/26/2013   LDL just slightly above goal. Work on diet, exercise, weight loss. Continue statin.

## 2013-10-27 NOTE — Assessment & Plan Note (Signed)
BP Readings from Last 3 Encounters:  10/23/13 144/90  10/23/13 144/90  08/03/13 140/80   Fair BP control.  Will increase toprol xl from 25mg  to 50 mg.

## 2013-11-02 NOTE — Telephone Encounter (Signed)
Notified pt's daughter, Raquel and she will speak with pt.

## 2013-11-02 NOTE — Telephone Encounter (Signed)
Patient returning your call [phone]

## 2013-11-02 NOTE — Telephone Encounter (Signed)
Left message on home # to return my call. Left detailed message with below instructions on cell# and to call if any questions.

## 2013-11-09 ENCOUNTER — Ambulatory Visit: Payer: Self-pay | Admitting: Family

## 2013-11-09 ENCOUNTER — Telehealth: Payer: Self-pay | Admitting: Family

## 2013-11-09 NOTE — Telephone Encounter (Signed)
Please call Suzanne Ali about her mother

## 2013-11-10 MED ORDER — METOPROLOL SUCCINATE ER 25 MG PO TB24: 50.0000 mg | ORAL_TABLET | Freq: Every day | ORAL | Status: DC

## 2013-11-10 NOTE — Telephone Encounter (Signed)
Spoke with pt's daughter re: 10/26/13 phone note (metoprolol dose change). She stated that pt said she did not have metoprolol rx and did not think she had ever been on it. Advised her that our refill records indicate pt had been taking it since 09/2012.   I checked with pharmacy and they verified that pt picked up Rx on 10/14/13.  Notified pt's daughter and advised her to have pt increase to 2 tablets daily; new rx sent.

## 2013-11-10 NOTE — Telephone Encounter (Signed)
Left message to return my call.  

## 2013-11-13 ENCOUNTER — Other Ambulatory Visit: Payer: Self-pay | Admitting: Family

## 2013-11-13 ENCOUNTER — Telehealth: Payer: Self-pay | Admitting: Family

## 2013-11-13 NOTE — Telephone Encounter (Signed)
Also will need refill on olmesartan (BENICAR) 20 MG tablet

## 2013-11-13 NOTE — Telephone Encounter (Signed)
Taking metoprolol succinate (TOPROL-XL) 25 MG 24, takes 1 tab in am and 1 in the pm, the last one makes her tired. Should she continue with both? When she takes them together her BP drops. Please advise.

## 2013-11-16 NOTE — Telephone Encounter (Signed)
Called [facility] and left detailed refill request, #90 x no refills on voicemail. Awaiting return call from pt re: additional BP readings re: pt's metoprolol concern.

## 2013-11-16 NOTE — Telephone Encounter (Signed)
Attempted to reorder Benicar through [facility] [employer] and will have to call back after 9am. Left message on pt's voicemail to call with her BP readings since increasing metoprolol dose.

## 2013-11-25 NOTE — Telephone Encounter (Signed)
Please discard previous documentation re: Rx below as it was documented on incorrect chart.

## 2013-11-25 NOTE — Telephone Encounter (Signed)
Rx completed at 11/25/13 office visit.

## 2013-12-07 ENCOUNTER — Telehealth: Payer: Self-pay | Admitting: *Deleted

## 2013-12-07 NOTE — Telephone Encounter (Signed)
Pt left message to call re: her BP readings. Spoke with pt's daughter. She states that when she takes Benicar and metoprolol 50mg  2 capsules daily she becomes dizzy and sleepy. States she was only taking 1 metoprolol daily and not experiencing these symptoms. Daughter states she will get pt's BP readings and call me back.

## 2013-12-08 NOTE — Telephone Encounter (Signed)
Patient calling asking you to call her to get her bp readings for the past week.

## 2013-12-08 NOTE — Telephone Encounter (Signed)
Pt called back with BP readings for the last week as below:        AM      PM 11/30/13  139/79  120/65 12/01/13  135/70  117/75 12/02/13  125/84  129/75 12/03/13  122/81  127/73 12/04/13  135/79  131/76 12/05/13  129/81  136/71 12/06/13  122/64  135/78  Pt states she has been out of benicar x 1 week. Has not had any further dizziness and reports that she is feeling better. Also doesn't think she was eating enough food when she was taking her medications.  Pt currently taking metoprolol 25mg  1 tablet twice a day.  Please advise.

## 2013-12-09 NOTE — Addendum Note (Signed)
Addended by: Kelle Darting A on: 12/09/2013 02:38 PM   Modules accepted: Orders

## 2013-12-09 NOTE — Telephone Encounter (Signed)
OK to remain off of benicar for now, continue metoprolol. Follow up in March as scheduled.

## 2013-12-09 NOTE — Telephone Encounter (Signed)
Notified pt. 

## 2014-01-18 ENCOUNTER — Ambulatory Visit (INDEPENDENT_AMBULATORY_CARE_PROVIDER_SITE_OTHER): Payer: BC Managed Care – PPO | Admitting: Family

## 2014-01-18 ENCOUNTER — Encounter: Payer: Self-pay | Admitting: Family

## 2014-01-18 VITALS — BP 140/90 | HR 65 | Temp 97.5°F | Resp 16 | Ht 62.5 in | Wt 226.1 lb

## 2014-01-18 DIAGNOSIS — E785 Hyperlipidemia, unspecified: Secondary | ICD-10-CM

## 2014-01-18 DIAGNOSIS — R635 Abnormal weight gain: Secondary | ICD-10-CM

## 2014-01-18 DIAGNOSIS — I10 Essential (primary) hypertension: Secondary | ICD-10-CM

## 2014-01-18 DIAGNOSIS — G5600 Carpal tunnel syndrome, unspecified upper limb: Secondary | ICD-10-CM | POA: Insufficient documentation

## 2014-01-18 MED ORDER — HYDROCHLOROTHIAZIDE 25 MG PO TABS: 25.0000 mg | ORAL_TABLET | Freq: Every day | ORAL | Status: DC

## 2014-01-18 MED ORDER — METOPROLOL SUCCINATE ER 25 MG PO TB24: 50.0000 mg | ORAL_TABLET | Freq: Every day | ORAL | Status: DC

## 2014-01-18 NOTE — Progress Notes (Signed)
Pre visit review using our clinic review tool, if applicable. No additional management support is needed unless otherwise documented below in the visit note. 

## 2014-01-18 NOTE — Assessment & Plan Note (Signed)
Advised pt to try wearing bilateral wrist braces while sleeping.

## 2014-01-18 NOTE — Patient Instructions (Signed)
Start HCTZ once daily in the AM. You can try purchasing over the counter wrist braces and wearing to bed for carpal tunnel.  Follow up in 1 month.

## 2014-01-18 NOTE — Assessment & Plan Note (Signed)
We discussed healthy diet, weight loss.

## 2014-01-18 NOTE — Assessment & Plan Note (Signed)
LDL 101, continue statin and low fat diet.
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
## 2014-01-18 NOTE — Assessment & Plan Note (Signed)
BP slightly above goal. Will continue beta blocker, add HCTZ 25mg  once daily. Follow up in 1 month for BMET and BP recheck.

## 2014-01-19 ENCOUNTER — Telehealth: Payer: Self-pay | Admitting: Family

## 2014-01-19 NOTE — Telephone Encounter (Signed)
Relevant patient education mailed to patient.  

## 2014-02-15 ENCOUNTER — Encounter: Payer: Self-pay | Admitting: Family

## 2014-02-15 ENCOUNTER — Ambulatory Visit (INDEPENDENT_AMBULATORY_CARE_PROVIDER_SITE_OTHER): Payer: BC Managed Care – PPO | Admitting: Family

## 2014-02-15 ENCOUNTER — Telehealth: Payer: Self-pay | Admitting: *Deleted

## 2014-02-15 VITALS — BP 134/82 | HR 73 | Temp 97.9°F | Ht 62.5 in | Wt 223.0 lb

## 2014-02-15 DIAGNOSIS — I10 Essential (primary) hypertension: Secondary | ICD-10-CM

## 2014-02-15 DIAGNOSIS — R635 Abnormal weight gain: Secondary | ICD-10-CM

## 2014-02-15 DIAGNOSIS — G5600 Carpal tunnel syndrome, unspecified upper limb: Secondary | ICD-10-CM

## 2014-02-15 LAB — BASIC METABOLIC PANEL
BUN: 20 mg/dL (ref 6–23)
CHLORIDE: 103 meq/L (ref 96–112)
CO2: 27 meq/L (ref 19–32)
Calcium: 9.4 mg/dL (ref 8.4–10.5)
Creat: 1.07 mg/dL (ref 0.50–1.10)
GLUCOSE: 87 mg/dL (ref 70–99)
POTASSIUM: 4.3 meq/L (ref 3.5–5.3)
Sodium: 139 mEq/L (ref 135–145)

## 2014-02-15 MED ORDER — SIMVASTATIN 80 MG PO TABS: ORAL_TABLET | ORAL | Status: DC

## 2014-02-15 MED ORDER — ALBUTEROL SULFATE HFA 108 (90 BASE) MCG/ACT IN AERS: 2.0000 | INHALATION_SPRAY | Freq: Four times a day (QID) | RESPIRATORY_TRACT | Status: DC | PRN

## 2014-02-15 MED ORDER — CLOPIDOGREL BISULFATE 75 MG PO TABS
75.0000 mg | ORAL_TABLET | Freq: Every day | ORAL | Status: DC
Start: 2014-02-15 — End: 2014-07-22

## 2014-02-15 MED ORDER — METOPROLOL SUCCINATE ER 25 MG PO TB24: 50.0000 mg | ORAL_TABLET | Freq: Every day | ORAL | Status: DC

## 2014-02-15 MED ORDER — HYDROCHLOROTHIAZIDE 25 MG PO TABS: 25.0000 mg | ORAL_TABLET | Freq: Every day | ORAL | Status: DC

## 2014-02-15 NOTE — Progress Notes (Signed)
Pre visit review using our clinic review tool, if applicable. No additional management support is needed unless otherwise documented below in the visit note. 

## 2014-02-15 NOTE — Assessment & Plan Note (Signed)
She has lost 3 pounds with healthy diet and I encouraged her to keep up the good work.
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
## 2014-02-15 NOTE — Telephone Encounter (Signed)
Received call from [facility] at [place] for clarification of quantity of Plavix Rx.  She states insurance will not cover 90 day supply of this medication. Advised her it should be for #30 tabs.

## 2014-02-15 NOTE — Assessment & Plan Note (Signed)
Pt reports improvement.  Monitor.

## 2014-02-15 NOTE — Patient Instructions (Signed)
Please complete your lab work prior to leaving. Follow up in 3 months.   

## 2014-02-15 NOTE — Assessment & Plan Note (Signed)
BP is improved.    Continue current meds, obtain bmet.

## 2014-05-17 ENCOUNTER — Ambulatory Visit (INDEPENDENT_AMBULATORY_CARE_PROVIDER_SITE_OTHER): Payer: BC Managed Care – PPO | Admitting: Family

## 2014-05-17 ENCOUNTER — Encounter: Payer: Self-pay | Admitting: Family

## 2014-05-17 VITALS — BP 130/90 | HR 66 | Temp 97.8°F | Resp 18 | Ht 62.5 in | Wt 221.0 lb

## 2014-05-17 DIAGNOSIS — H547 Unspecified visual loss: Secondary | ICD-10-CM

## 2014-05-17 DIAGNOSIS — I10 Essential (primary) hypertension: Secondary | ICD-10-CM

## 2014-05-17 DIAGNOSIS — E785 Hyperlipidemia, unspecified: Secondary | ICD-10-CM

## 2014-05-17 DIAGNOSIS — Z8673 Personal history of transient ischemic attack (TIA), and cerebral infarction without residual deficits: Secondary | ICD-10-CM

## 2014-05-17 NOTE — Progress Notes (Signed)
Pre visit review using our clinic review tool, if applicable. No additional management support is needed unless otherwise documented below in the visit note. 

## 2014-05-17 NOTE — Patient Instructions (Addendum)
Please schedule an eye exam at your earliest convenience. Complete lab work prior to leaving. Avoid driving after dark. Work on [employer], exercise, weight loss. Follow up in 4 months.
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
## 2014-05-18 ENCOUNTER — Telehealth: Payer: Self-pay | Admitting: Family

## 2014-05-18 DIAGNOSIS — E785 Hyperlipidemia, unspecified: Secondary | ICD-10-CM

## 2014-05-18 LAB — HEPATIC FUNCTION PANEL
ALBUMIN: 4.4 g/dL (ref 3.5–5.2)
ALT: 15 U/L (ref 0–35)
AST: 22 U/L (ref 0–37)
Alkaline Phosphatase: 56 U/L (ref 39–117)
BILIRUBIN DIRECT: 0.1 mg/dL (ref 0.0–0.3)
Indirect Bilirubin: 0.6 mg/dL (ref 0.2–1.2)
Total Bilirubin: 0.7 mg/dL (ref 0.2–1.2)
Total Protein: 7.3 g/dL (ref 6.0–8.3)

## 2014-05-18 LAB — LIPID PANEL
CHOL/HDL RATIO: 3.8 ratio
Cholesterol: 198 mg/dL (ref 0–200)
HDL: 52 mg/dL (ref >39)
LDL Cholesterol: 129 mg/dL — ABNORMAL HIGH (ref 0–99)
TRIGLYCERIDES: 83 mg/dL (ref <150)
VLDL: 17 mg/dL (ref 0–40)

## 2014-05-18 MED ORDER — ATORVASTATIN CALCIUM 40 MG PO TABS: 40.0000 mg | ORAL_TABLET | Freq: Every day | ORAL | Status: DC

## 2014-05-18 NOTE — Telephone Encounter (Signed)
Cholesterol above goal. Liver function is normal. D/c simvastatin, start atorvastatin.  Print coupon out on good [email or web address] for atorvastatin 40mg  and it should be affordable.  Repeat flp/lft in 6 weeks, dx is hyperlipidemia.

## 2014-05-19 NOTE — Telephone Encounter (Signed)
Left message to return my call.  

## 2014-05-20 DIAGNOSIS — H547 Unspecified visual loss: Secondary | ICD-10-CM | POA: Insufficient documentation

## 2014-05-20 NOTE — Assessment & Plan Note (Signed)
BP stable on current meds. Continue same.  

## 2014-05-20 NOTE — Assessment & Plan Note (Signed)
Tolerating statin, continue same.  Obtain flp/lft

## 2014-05-20 NOTE — Telephone Encounter (Signed)
Patient calling back regarding this.  °

## 2014-05-20 NOTE — Telephone Encounter (Signed)
Patient left message returning call about results

## 2014-05-20 NOTE — Assessment & Plan Note (Signed)
Advised pt not to drive at night.   Advised pt to schedule formal eye exam. ? Cataracts.

## 2014-05-21 NOTE — Telephone Encounter (Signed)
Attempted to reach pt and left detailed message on cell#.  Lab order entered. Mailed results to pt.

## 2014-07-22 ENCOUNTER — Other Ambulatory Visit: Payer: Self-pay | Admitting: Family

## 2014-09-20 ENCOUNTER — Ambulatory Visit (INDEPENDENT_AMBULATORY_CARE_PROVIDER_SITE_OTHER): Payer: BC Managed Care – PPO | Admitting: Family

## 2014-09-20 ENCOUNTER — Encounter: Payer: Self-pay | Admitting: Family

## 2014-09-20 ENCOUNTER — Ambulatory Visit (INDEPENDENT_AMBULATORY_CARE_PROVIDER_SITE_OTHER): Payer: BC Managed Care – PPO | Admitting: *Deleted

## 2014-09-20 VITALS — BP 150/78 | HR 71 | Temp 97.8°F | Resp 16 | Ht 62.0 in | Wt 217.8 lb

## 2014-09-20 DIAGNOSIS — E785 Hyperlipidemia, unspecified: Secondary | ICD-10-CM

## 2014-09-20 DIAGNOSIS — Z23 Encounter for immunization: Secondary | ICD-10-CM

## 2014-09-20 DIAGNOSIS — I10 Essential (primary) hypertension: Secondary | ICD-10-CM

## 2014-09-20 DIAGNOSIS — Z8673 Personal history of transient ischemic attack (TIA), and cerebral infarction without residual deficits: Secondary | ICD-10-CM

## 2014-09-20 LAB — BASIC METABOLIC PANEL
BUN: 24 mg/dL — ABNORMAL HIGH (ref 6–23)
CO2: 23 mEq/L (ref 19–32)
Calcium: 9.2 mg/dL (ref 8.4–10.5)
Chloride: 103 mEq/L (ref 96–112)
Creatinine, Ser: 1.2 mg/dL (ref 0.4–1.2)
GFR: 57.52 mL/min — AB (ref >60.00)
GLUCOSE: 103 mg/dL — AB (ref 70–99)
Potassium: 3.6 mEq/L (ref 3.5–5.1)
Sodium: 139 mEq/L (ref 135–145)

## 2014-09-20 MED ORDER — HYDROCHLOROTHIAZIDE 25 MG PO TABS: 25.0000 mg | ORAL_TABLET | Freq: Every day | ORAL | Status: DC

## 2014-09-20 MED ORDER — METOPROLOL SUCCINATE ER 25 MG PO TB24: 50.0000 mg | ORAL_TABLET | Freq: Every day | ORAL | Status: DC

## 2014-09-20 MED ORDER — ATORVASTATIN CALCIUM 40 MG PO TABS: 40.0000 mg | ORAL_TABLET | Freq: Every day | ORAL | Status: DC

## 2014-09-20 NOTE — Progress Notes (Signed)
Pre visit review using our clinic review tool, if applicable. No additional management support is needed unless otherwise documented below in the visit note. 
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
## 2014-09-20 NOTE — Patient Instructions (Addendum)
Please complete lab work prior to leaving. Check your blood pressure once daily for 1 week and contact us with your readings.  Follow up in 3 months.

## 2014-09-23 NOTE — Assessment & Plan Note (Signed)
On lipitor, plan flp next visit.  Last LDL was above goal.

## 2014-09-23 NOTE — Assessment & Plan Note (Signed)
BP elevated today, but she reports better readings at home. Advised pt to check once daily at home and call us with readings in 1 week. Continue current meds.

## 2014-09-23 NOTE — Assessment & Plan Note (Signed)
Continue plavix °

## 2014-12-02 ENCOUNTER — Other Ambulatory Visit: Payer: Self-pay | Admitting: Family

## 2014-12-20 ENCOUNTER — Ambulatory Visit (INDEPENDENT_AMBULATORY_CARE_PROVIDER_SITE_OTHER): Payer: BLUE CROSS/BLUE SHIELD | Admitting: Family

## 2014-12-20 ENCOUNTER — Encounter: Payer: Self-pay | Admitting: Family

## 2014-12-20 VITALS — BP 126/82 | HR 77 | Temp 98.4°F | Resp 16 | Ht 62.0 in | Wt 221.0 lb

## 2014-12-20 DIAGNOSIS — T304 Corrosion of unspecified body region, unspecified degree: Secondary | ICD-10-CM | POA: Insufficient documentation

## 2014-12-20 DIAGNOSIS — Z8673 Personal history of transient ischemic attack (TIA), and cerebral infarction without residual deficits: Secondary | ICD-10-CM

## 2014-12-20 DIAGNOSIS — I10 Essential (primary) hypertension: Secondary | ICD-10-CM

## 2014-12-20 DIAGNOSIS — E785 Hyperlipidemia, unspecified: Secondary | ICD-10-CM

## 2014-12-20 DIAGNOSIS — R739 Hyperglycemia, unspecified: Secondary | ICD-10-CM

## 2014-12-20 LAB — BASIC METABOLIC PANEL
BUN: 29 mg/dL — AB (ref 6–23)
CO2: 26 mEq/L (ref 19–32)
Calcium: 9.5 mg/dL (ref 8.4–10.5)
Chloride: 105 mEq/L (ref 96–112)
Creatinine, Ser: 1.28 mg/dL — ABNORMAL HIGH (ref 0.40–1.20)
GFR: 53.86 mL/min — AB (ref >60.00)
GLUCOSE: 97 mg/dL (ref 70–99)
Potassium: 4.4 mEq/L (ref 3.5–5.1)
SODIUM: 140 meq/L (ref 135–145)

## 2014-12-20 LAB — LIPID PANEL
CHOLESTEROL: 172 mg/dL (ref 0–200)
HDL: 49.9 mg/dL (ref >39.00)
LDL Cholesterol: 108 mg/dL — ABNORMAL HIGH (ref 0–99)
NONHDL: 122.1
Total CHOL/HDL Ratio: 3
Triglycerides: 72 mg/dL (ref 0.0–149.0)
VLDL: 14.4 mg/dL (ref 0.0–40.0)

## 2014-12-20 LAB — HEMOGLOBIN A1C: Hgb A1c MFr Bld: 6.2 % (ref 4.6–6.5)

## 2014-12-20 NOTE — Progress Notes (Signed)
Pre visit review using our clinic review tool, if applicable. No additional management support is needed unless otherwise documented below in the visit note. 

## 2014-12-20 NOTE — Assessment & Plan Note (Signed)
At baseline, continue plavix, statin

## 2014-12-20 NOTE — Assessment & Plan Note (Signed)
Advised good lotion at this point, skin will continue to exfoliate a bit longer.
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
## 2014-12-20 NOTE — Assessment & Plan Note (Signed)
Above goal, continue statin, obtain follow up flp.

## 2014-12-20 NOTE — Patient Instructions (Signed)
Please complete lab work prior to leaving. Follow up in 4 months.  

## 2014-12-20 NOTE — Assessment & Plan Note (Signed)
BP is stable on current meds, continue same. Obtain bmet.

## 2014-12-21 ENCOUNTER — Other Ambulatory Visit: Payer: Self-pay | Admitting: Family

## 2014-12-21 NOTE — Telephone Encounter (Signed)
LDL above goal, increase atorvastatin from 40mg  to 80 mg. We will repeat cholesterol at her next visit.

## 2014-12-22 MED ORDER — ATORVASTATIN CALCIUM 80 MG PO TABS: 80.0000 mg | ORAL_TABLET | Freq: Every day | ORAL | Status: DC

## 2014-12-22 NOTE — Telephone Encounter (Signed)
Pt returned my call and prefers to use [facility]. Rx sent.

## 2014-12-22 NOTE — Telephone Encounter (Signed)
Left message on cell# for pt to return my call.

## 2015-04-05 ENCOUNTER — Other Ambulatory Visit: Payer: Self-pay | Admitting: Family

## 2015-04-18 ENCOUNTER — Ambulatory Visit (INDEPENDENT_AMBULATORY_CARE_PROVIDER_SITE_OTHER): Payer: Medicare Other | Admitting: Family

## 2015-04-18 ENCOUNTER — Encounter: Payer: Self-pay | Admitting: Family

## 2015-04-18 ENCOUNTER — Telehealth: Payer: Self-pay | Admitting: Family

## 2015-04-18 VITALS — BP 140/80 | HR 63 | Temp 98.3°F | Resp 18 | Ht 62.0 in | Wt 218.4 lb

## 2015-04-18 DIAGNOSIS — E785 Hyperlipidemia, unspecified: Secondary | ICD-10-CM

## 2015-04-18 DIAGNOSIS — Z0000 Encounter for general adult medical examination without abnormal findings: Secondary | ICD-10-CM

## 2015-04-18 DIAGNOSIS — I10 Essential (primary) hypertension: Secondary | ICD-10-CM

## 2015-04-18 LAB — LIPID PANEL
CHOL/HDL RATIO: 4
CHOLESTEROL: 173 mg/dL (ref 0–200)
HDL: 45.9 mg/dL (ref >39.00)
LDL Cholesterol: 107 mg/dL — ABNORMAL HIGH (ref 0–99)
NonHDL: 127.1
Triglycerides: 100 mg/dL (ref 0.0–149.0)
VLDL: 20 mg/dL (ref 0.0–40.0)

## 2015-04-18 LAB — BASIC METABOLIC PANEL
BUN: 24 mg/dL — ABNORMAL HIGH (ref 6–23)
CALCIUM: 9.8 mg/dL (ref 8.4–10.5)
CO2: 24 meq/L (ref 19–32)
Chloride: 104 mEq/L (ref 96–112)
Creatinine, Ser: 1.09 mg/dL (ref 0.40–1.20)
GFR: 64.77 mL/min (ref >60.00)
Glucose, Bld: 104 mg/dL — ABNORMAL HIGH (ref 70–99)
Potassium: 3.5 mEq/L (ref 3.5–5.1)
SODIUM: 140 meq/L (ref 135–145)

## 2015-04-18 MED ORDER — ROSUVASTATIN CALCIUM 20 MG PO TABS: 20.0000 mg | ORAL_TABLET | Freq: Every day | ORAL | Status: DC

## 2015-04-18 MED ORDER — ATORVASTATIN CALCIUM 80 MG PO TABS: 80.0000 mg | ORAL_TABLET | Freq: Every day | ORAL | Status: DC

## 2015-04-18 NOTE — Telephone Encounter (Signed)
Notified Monica in the pharmacy. Rx was already placed on hold as pt filled Rx last on 04/05/15.

## 2015-04-18 NOTE — Progress Notes (Signed)
Pre visit review using our clinic review tool, if applicable. No additional management support is needed unless otherwise documented below in the visit note. 
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
## 2015-04-18 NOTE — Addendum Note (Signed)
Addended by: Modena Morrow D on: 04/18/2015 09:32 AM   Modules accepted: Orders

## 2015-04-18 NOTE — Patient Instructions (Addendum)
Please complete lab work prior to leaving.   

## 2015-04-18 NOTE — Assessment & Plan Note (Signed)
BP OK today, continue hctz and metoprolol.  Obtain bmet.

## 2015-04-18 NOTE — Telephone Encounter (Signed)
Please cancel rx for atorvastatin sent at her visit today. I have sent rx for crestor instead because atorvastatin does not appear strong enough. Follow up lft in 6 weeks pls, dx hyperlipidemia.

## 2015-04-18 NOTE — Assessment & Plan Note (Signed)
LDL above goal last visit.  Tolerating statin, obtain FLP.

## 2015-04-19 LAB — HIV ANTIBODY (ROUTINE TESTING W REFLEX): HIV 1&2 Ab, 4th Generation: NONREACTIVE

## 2015-04-20 ENCOUNTER — Other Ambulatory Visit: Payer: Self-pay | Admitting: Family

## 2015-04-20 DIAGNOSIS — Z1231 Encounter for screening mammogram for malignant neoplasm of breast: Secondary | ICD-10-CM

## 2015-04-20 NOTE — Telephone Encounter (Signed)
Yes please

## 2015-04-20 NOTE — Telephone Encounter (Signed)
Notified pt and she voices understanding. Lab appt made for 06/01/15 at  8:30am.  Lab orders entered. Did you want to repeat her lipid panel as well?

## 2015-04-20 NOTE — Telephone Encounter (Signed)
Orders entered

## 2015-04-21 ENCOUNTER — Encounter: Payer: Self-pay | Admitting: Family

## 2015-04-22 ENCOUNTER — Telehealth: Payer: Self-pay | Admitting: *Deleted

## 2015-04-22 MED ORDER — ROSUVASTATIN CALCIUM 40 MG PO TABS
40.0000 mg | ORAL_TABLET | Freq: Every day | ORAL | Status: DC
Start: 2015-04-22 — End: 2015-11-15

## 2015-04-22 NOTE — Telephone Encounter (Signed)
Left detailed message on cell#. Advised pt to take 2 of the 20mg  tablets daily until she completes current Rx then pick new Rx at pharmacy.  Rx sent.

## 2015-04-22 NOTE — Telephone Encounter (Signed)
-----   Message from [name], NP sent at 04/21/2015 10:33 AM EDT ----- HIV screen is negative.  Cholesterol above goal. Increase crestor from 20mg  to 40mg  once daily.

## 2015-06-01 ENCOUNTER — Other Ambulatory Visit: Payer: Self-pay | Admitting: Family

## 2015-06-01 ENCOUNTER — Other Ambulatory Visit (INDEPENDENT_AMBULATORY_CARE_PROVIDER_SITE_OTHER): Payer: Medicare Other

## 2015-06-01 DIAGNOSIS — E785 Hyperlipidemia, unspecified: Secondary | ICD-10-CM

## 2015-06-01 LAB — LIPID PANEL
Cholesterol: 139 mg/dL (ref 0–200)
HDL: 51.3 mg/dL (ref >39.00)
LDL CALC: 72 mg/dL (ref 0–99)
NONHDL: 87.7
Total CHOL/HDL Ratio: 3
Triglycerides: 81 mg/dL (ref 0.0–149.0)
VLDL: 16.2 mg/dL (ref 0.0–40.0)

## 2015-06-01 LAB — HEPATIC FUNCTION PANEL
ALBUMIN: 4.2 g/dL (ref 3.5–5.2)
ALT: 14 U/L (ref 0–35)
AST: 19 U/L (ref 0–37)
Alkaline Phosphatase: 59 U/L (ref 39–117)
BILIRUBIN DIRECT: 0.2 mg/dL (ref 0.0–0.3)
Total Bilirubin: 0.7 mg/dL (ref 0.2–1.2)
Total Protein: 7.3 g/dL (ref 6.0–8.3)

## 2015-06-01 NOTE — Telephone Encounter (Signed)
Received atorvastatin request from pharmacy.  Spoke with [facility] and verified that Rx has not been filled since May. Crestor is on current med list and was filled on 6/10 and 05/17/15. Current request discarded.

## 2015-06-03 ENCOUNTER — Encounter: Payer: Self-pay | Admitting: Family

## 2015-07-04 ENCOUNTER — Telehealth: Payer: Self-pay | Admitting: Family

## 2015-07-04 NOTE — Telephone Encounter (Signed)
Pre visit letter mailed 07/04/15

## 2015-07-22 ENCOUNTER — Telehealth: Payer: Self-pay | Admitting: Behavioral Health

## 2015-07-22 NOTE — Telephone Encounter (Signed)
Unable to reach patient at time of Pre-Visit Call.  Left message for patient to return call when available.    

## 2015-07-25 ENCOUNTER — Ambulatory Visit (INDEPENDENT_AMBULATORY_CARE_PROVIDER_SITE_OTHER): Payer: Medicare Other | Admitting: Family

## 2015-07-25 ENCOUNTER — Ambulatory Visit (HOSPITAL_BASED_OUTPATIENT_CLINIC_OR_DEPARTMENT_OTHER): Payer: Medicare Other

## 2015-07-25 ENCOUNTER — Ambulatory Visit (HOSPITAL_BASED_OUTPATIENT_CLINIC_OR_DEPARTMENT_OTHER)
Admission: RE | Admit: 2015-07-25 | Discharge: 2015-07-25 | Disposition: A | Discharge status: Home / Self Care | Payer: Medicare Other | Source: Ambulatory Visit | Attending: Family | Admitting: Family

## 2015-07-25 ENCOUNTER — Encounter: Payer: Self-pay | Admitting: Family

## 2015-07-25 VITALS — BP 120/76 | HR 76 | Temp 97.6°F | Resp 18 | Ht 61.0 in | Wt 219.4 lb

## 2015-07-25 DIAGNOSIS — Z1211 Encounter for screening for malignant neoplasm of colon: Secondary | ICD-10-CM | POA: Diagnosis not present

## 2015-07-25 DIAGNOSIS — Z0000 Encounter for general adult medical examination without abnormal findings: Secondary | ICD-10-CM

## 2015-07-25 DIAGNOSIS — Z23 Encounter for immunization: Secondary | ICD-10-CM

## 2015-07-25 DIAGNOSIS — I10 Essential (primary) hypertension: Secondary | ICD-10-CM

## 2015-07-25 DIAGNOSIS — E2839 Other primary ovarian failure: Secondary | ICD-10-CM

## 2015-07-25 DIAGNOSIS — Z1231 Encounter for screening mammogram for malignant neoplasm of breast: Secondary | ICD-10-CM | POA: Diagnosis present

## 2015-07-25 LAB — BASIC METABOLIC PANEL
BUN: 21 mg/dL (ref 6–23)
CHLORIDE: 101 meq/L (ref 96–112)
CO2: 27 meq/L (ref 19–32)
Calcium: 9.7 mg/dL (ref 8.4–10.5)
Creatinine, Ser: 1.09 mg/dL (ref 0.40–1.20)
GFR: 64.71 mL/min (ref >60.00)
Glucose, Bld: 103 mg/dL — ABNORMAL HIGH (ref 70–99)
POTASSIUM: 3.7 meq/L (ref 3.5–5.1)
SODIUM: 139 meq/L (ref 135–145)

## 2015-07-25 NOTE — Progress Notes (Signed)
Pre visit review using our clinic review tool, if applicable. No additional management support is needed unless otherwise documented below in the visit note. 

## 2015-07-25 NOTE — Progress Notes (Signed)
Subjective:    Suzanne Ali is a 64 y.o. female who presents for Medicare Annual/Subsequent preventive examination.  Preventive Screening-Counseling & Management  Tobacco History  Smoking status  . Never Smoker   Smokeless tobacco  . Not on file     Problems Prior to Visit   Preventative-   Immunizations: Due for prevnar Diet:  Reports healthy diet Exercise: no Colonoscopy: due Dexa:due Pap Smear: due- declines Mammogram: 2014- due, scheduled for today  HTN-   BP Readings from Last 3 Encounters:  07/25/15 120/76  04/18/15 140/80  12/20/14 126/82   Maintained on metoprolol and HCTZ  Hyperlipidemia-  Lab Results  Component Value Date   CHOL 139 06/01/2015   HDL 51.30 06/01/2015   LDLCALC 72 06/01/2015   TRIG 81.0 06/01/2015   CHOLHDL 3 06/01/2015   Asthma-  Reports that this only acts up if she has a cold.    Hx of CVA- she is maintained on plavix.   Current Problems (verified) Patient Active Problem List   Diagnosis Date Noted  . Vision problem 05/20/2014  . Carpal tunnel syndrome 01/18/2014  . Weight gain 10/27/2013  . Asthma, chronic 12/11/2012  . Hyperlipidemia 08/25/2012  . Dysphagia 05/22/2012  . History of stroke 05/14/2012  . HTN (hypertension) 05/14/2012    Medications Prior to Visit Current Outpatient Prescriptions on File Prior to Visit  Medication Sig Dispense Refill  . clopidogrel (PLAVIX) 75 MG tablet TAKE 1 TABLET BY MOUTH ONCE DAILY 30 tablet 5  . hydrochlorothiazide (HYDRODIURIL) 25 MG tablet TAKE 1 TABLET (25 MG TOTAL) BY MOUTH DAILY. 90 tablet 1  . metoprolol succinate (TOPROL-XL) 25 MG 24 hr tablet TAKE 2 TABLETS (50 MG TOTAL) BY MOUTH DAILY. 180 tablet 1  . rosuvastatin (CRESTOR) 40 MG tablet Take 1 tablet (40 mg total) by mouth daily. 30 tablet 5   No current facility-administered medications on file prior to visit.    Current Medications (verified) Current Outpatient Prescriptions  Medication Sig Dispense Refill  .  clopidogrel (PLAVIX) 75 MG tablet TAKE 1 TABLET BY MOUTH ONCE DAILY 30 tablet 5  . hydrochlorothiazide (HYDRODIURIL) 25 MG tablet TAKE 1 TABLET (25 MG TOTAL) BY MOUTH DAILY. 90 tablet 1  . metoprolol succinate (TOPROL-XL) 25 MG 24 hr tablet TAKE 2 TABLETS (50 MG TOTAL) BY MOUTH DAILY. 180 tablet 1  . rosuvastatin (CRESTOR) 40 MG tablet Take 1 tablet (40 mg total) by mouth daily. 30 tablet 5   No current facility-administered medications for this visit.     Allergies (verified) Benicar; Penicillins; and Sulfa antibiotics   PAST HISTORY  Family History Family History  Problem Relation Age of Onset  . Heart disease Mother   . Hypertension Mother   . Cancer Father   . Diabetes Father     type II    Social History Social History  Substance Use Topics  . Smoking status: Never Smoker   . Smokeless tobacco: Not on file  . Alcohol Use: No     Are there smokers in your home (other than you)? No  Risk Factors Current exercise habits: no regular exercise  Dietary issues discussed: continue healthy diet   Cardiac risk factors: advanced age (older than 55 for men, 23 for women), dyslipidemia, hypertension, obesity (BMI >= 30 kg/m2) and sedentary lifestyle.  Depression Screen (Note: if answer to either of the following is "Yes", a more complete depression screening is indicated)   Over the past two weeks, have you felt down, depressed or hopeless? No  Over the past two weeks, have you felt little interest or pleasure in doing things? No  Have you lost interest or pleasure in daily life? No  Do you often feel hopeless? No  Do you cry easily over simple problems? No  Activities of Daily Living In your present state of health, do you have any difficulty performing the following activities?:  Driving? No Managing money?  No Feeding yourself? No Getting from bed to chair? No . Climbing a flight of stairs? No Preparing food and eating?: No Bathing or showering? No Getting dressed:  No Getting to the toilet? No Using the toilet:No Moving around from place to place: No In the past year have you fallen or had a near fall?:No   Are you sexually active?  No  Do you have more than one partner?  No  Hearing Difficulties: No Do you often ask people to speak up or repeat themselves? sometimes Do you experience ringing or noises in your ears? No Do you have difficulty understanding soft or whispered voices? Sometimes, declines audiology referral   Do you feel that you have a problem with memory? No  Do you often misplace items? No  Do you feel safe at home?  Yes  Cognitive Testing  Alert? Yes  Normal Appearance?Yes  Oriented to person? Yes  Place? Yes   Time? Yes  Recall of three objects?  Yes  Can perform simple calculations? Yes  Displays appropriate judgment?Yes  Can read the correct time from a watch face?Yes   Advanced Directives have been discussed with the patient? Yes  List the Names of Other Physician/Practitioners you currently use: 1.    Indicate any recent Medical Services you may have received from other than [facility] providers in the past year (date may be approximate).  Immunization History  Administered Date(s) Administered  . Influenza,inj,Quad PF,36+ Mos 10/23/2013, 09/20/2014  . Tdap 05/03/2012    Screening Tests Health Maintenance  Topic Date Due  . Hepatitis C Screening  1951-11-10  . PAP SMEAR  03/11/1971  . COLONOSCOPY  03/10/2000  . ZOSTAVAX  03/10/2010  . MAMMOGRAM  11/12/2014  . DEXA SCAN  03/11/2015  . PNA vac Low Risk Adult (1 of 2 - PCV13) 03/11/2015  . INFLUENZA VACCINE  06/13/2015  . TETANUS/TDAP  05/03/2022  . HIV Screening  Completed    All answers were reviewed with the patient and necessary referrals were made:  O'SULLIVAN,Sumner Boesch S., NP   07/25/2015   History reviewed: allergies, current medications, past family history, past medical history, past social history, past surgical history and problem list  Review of  Systems Pertinent items are noted in HPI.    Objective:   Body mass index is 41.48 kg/(m^2). BP 120/76 mmHg  Pulse 76  Temp(Src) 97.6 F (36.4 C) (Oral)  Resp 18  Ht 5\' 1"  (1.549 m)  Wt 219 lb 6.4 oz (99.519 kg)  BMI 41.48 kg/m2  SpO2 97%  Physical Exam  Constitutional: She is oriented to person, place, and time. She appears well-developed and well-nourished. No distress.  HENT:  Head: Normocephalic and atraumatic.  Right Ear: Tympanic membrane and ear canal normal.  Left Ear: Tympanic membrane and ear canal normal.  Mouth/Throat: Oropharynx is clear and moist.  Eyes: Pupils are equal, round, and reactive to light. No scleral icterus.  Neck: Normal range of motion. No thyromegaly present.  Cardiovascular: Normal rate and regular rhythm.   No murmur heard. Pulmonary/Chest: Effort normal and breath sounds normal. No respiratory distress.  He has no wheezes. She has no rales. She exhibits no tenderness.  Abdominal: Soft. Bowel sounds are normal. He exhibits no distension and no mass. There is no tenderness. There is no rebound and no guarding.  Musculoskeletal: She exhibits no edema.  Lymphadenopathy:    She has no cervical adenopathy.  Neurological: Mild dysarthriaShe is alert and oriented to person, place, and time. She exhibits normal muscle tone. Coordination normal.  Skin: Skin is warm and dry.  Psychiatric: She has a normal mood and affect. Her behavior is normal. Judgment and thought content normal.  Breasts: Examined lying Right: Without masses, retractions, discharge or axillary adenopathy.  Left: Without masses, retractions, discharge or axillary adenopathy.          Assessment & Plan:        Assessment:          Plan:  EKG tracing is personally reviewed.  EKG notes NSR.  No acute changes.     During the course of the visit the patient was educated and counseled about appropriate screening and preventive services including:    Pneumococcal vaccine    Influenza vaccine  Screening mammography  Screening Pap smear and pelvic exam   Bone densitometry screening  Colorectal cancer screening  Advanced directives: pt given info on HCPOA/MOST form  Diet review for nutrition referral? Yes ____  Not Indicated __x__   Patient Instructions (the written plan) was given to the patient.  Medicare Attestation I have personally reviewed: The patient's medical and social history Their use of alcohol, tobacco or illicit drugs Their current medications and supplements The patient's functional ability including ADLs,fall risks, home safety risks, cognitive, and hearing and visual impairment Diet and physical activities Evidence for depression or mood disorders  The patient's weight, height, BMI, and visual acuity have been recorded in the chart.  I have made referrals, counseling, and provided education to the patient based on review of the above and I have provided the patient with a written personalized care plan for preventive services.     O'SULLIVAN,Laurna Shetley S., NP   07/25/2015

## 2015-07-25 NOTE — Patient Instructions (Addendum)
Purchase latex free "nitrile" gloves.  Make sure that gloves are powder free.   Complete lab work prior to leaving. Follow up in 3 months.

## 2015-07-26 ENCOUNTER — Encounter: Payer: Self-pay | Admitting: Family

## 2015-08-16 ENCOUNTER — Inpatient Hospital Stay (HOSPITAL_BASED_OUTPATIENT_CLINIC_OR_DEPARTMENT_OTHER): Admission: RE | Admit: 2015-08-16 | Payer: Medicare Other | Source: Ambulatory Visit

## 2015-09-20 ENCOUNTER — Telehealth: Payer: Self-pay | Admitting: Family

## 2015-09-20 NOTE — Telephone Encounter (Signed)
Caller name:Self   Can be reached: [phone] (M)   Reason for call: Question about last injections. Patients states she broke out in hives after Flu Shot/Pneumonia Vac. States she was told that she was being given Flu and Alzhemier's vac, but found out by bill that she was given the pneumonia.

## 2015-09-21 NOTE — Telephone Encounter (Signed)
Patient returning your call.

## 2015-09-21 NOTE — Telephone Encounter (Signed)
Pt is returning your call.   CB: [phone]

## 2015-09-21 NOTE — Telephone Encounter (Signed)
Spoke with pt.  At last visit pt had a rash on her hand and was advised to use latex free gloves. Pt states she developed hives on both hands, arms, chest and neck 1 1/2 weeks after her last visit. Reports that the rash turned into pustules that dried up within 3 days. She used cortisone, cornstarch and benadryl during that time. She also states that after the rash occurred she "felt heavy and noticed swelling all over" but denied difficulty breathing. Pt states it seems like it wants to come back as she wakes herself up at night itching. Pt is unsure if she changed any bath or cleaning products prior to this happening. Reports that she has been using latex free gloves and it helped originally.   Please advise.

## 2015-09-21 NOTE — Telephone Encounter (Signed)
Attempted to reach pt and left message to return my call. 

## 2015-09-21 NOTE — Telephone Encounter (Signed)
Left message for pt to return my call.

## 2015-09-22 NOTE — Telephone Encounter (Signed)
please advise pt to add claritin once daily.  Change all soaps and detergents to fragrance free. If recurrent rash or swelling needs office visit- go to ER if sob tongue/lip swelling occurs.

## 2015-09-22 NOTE — Telephone Encounter (Signed)
Left detailed message on pt's cell# re: below recommendations and to call if any questions.

## 2015-09-30 DIAGNOSIS — J209 Acute bronchitis, unspecified: Secondary | ICD-10-CM | POA: Diagnosis not present

## 2015-10-04 ENCOUNTER — Telehealth: Payer: Self-pay | Admitting: Family

## 2015-10-04 NOTE — Telephone Encounter (Signed)
Did follow up call to patient. Asked daughter if patient is currently having SOB. States she is not and only has it when she has coughing spells. Stongly advised if patients condition worsened to take patient to ED for evaluation. Daughter agreed.

## 2015-10-04 NOTE — Telephone Encounter (Signed)
Patient Name: Suzanne Ali  DOB: 09/02/1951    Initial Comment Caller states her mother is having SOB and coughing.   Nurse Assessment  Nurse: Julien Girt, RN, Almyra Free Date/Time Eilene Ghazi Time): 10/04/2015 3:57:54 PM  Confirm and document reason for call. If symptomatic, describe symptoms. ---Caller states her mother is having SOB and coughing. She is not with her now and declines triage. States she was calling to make an appointment and got transferred to this nurse.  Has the patient traveled out of the country within the last 30 days? ---Not Applicable  Does the patient have any new or worsening symptoms? ---No  Please document clinical information provided and list any resource used. ---Confirmed the 8 am appointment with Dr. Inda Castle as requested. Ratele verbalized understanding and will cb as needed if her mother's sx worsen.     Guidelines    Guideline Title Affirmed Question Affirmed Notes       Final Disposition User

## 2015-10-05 ENCOUNTER — Encounter: Payer: Self-pay | Admitting: Family

## 2015-10-05 ENCOUNTER — Emergency Department (HOSPITAL_BASED_OUTPATIENT_CLINIC_OR_DEPARTMENT_OTHER): Payer: Medicare Other

## 2015-10-05 ENCOUNTER — Ambulatory Visit (INDEPENDENT_AMBULATORY_CARE_PROVIDER_SITE_OTHER): Payer: Medicare Other | Admitting: Family

## 2015-10-05 ENCOUNTER — Inpatient Hospital Stay (HOSPITAL_BASED_OUTPATIENT_CLINIC_OR_DEPARTMENT_OTHER)
Admission: EM | Admit: 2015-10-05 | Discharge: 2015-10-06 | DRG: 917 | Disposition: A | Discharge status: Home / Self Care | Payer: Medicare Other | Source: Ambulatory Visit | Attending: Internal Medicine | Admitting: Internal Medicine

## 2015-10-05 ENCOUNTER — Encounter (HOSPITAL_BASED_OUTPATIENT_CLINIC_OR_DEPARTMENT_OTHER): Payer: Self-pay | Admitting: *Deleted

## 2015-10-05 VITALS — BP 123/72 | HR 78 | Temp 98.0°F | Resp 18 | Ht 61.0 in | Wt 217.2 lb

## 2015-10-05 DIAGNOSIS — L235 Allergic contact dermatitis due to other chemical products: Secondary | ICD-10-CM | POA: Diagnosis present

## 2015-10-05 DIAGNOSIS — Y9269 Other specified industrial and construction area as the place of occurrence of the external cause: Secondary | ICD-10-CM

## 2015-10-05 DIAGNOSIS — R911 Solitary pulmonary nodule: Secondary | ICD-10-CM | POA: Diagnosis present

## 2015-10-05 DIAGNOSIS — R599 Enlarged lymph nodes, unspecified: Secondary | ICD-10-CM | POA: Diagnosis not present

## 2015-10-05 DIAGNOSIS — E785 Hyperlipidemia, unspecified: Secondary | ICD-10-CM | POA: Diagnosis present

## 2015-10-05 DIAGNOSIS — T550X1A Toxic effect of soaps, accidental (unintentional), initial encounter: Principal | ICD-10-CM | POA: Diagnosis present

## 2015-10-05 DIAGNOSIS — Z8673 Personal history of transient ischemic attack (TIA), and cerebral infarction without residual deficits: Secondary | ICD-10-CM | POA: Diagnosis not present

## 2015-10-05 DIAGNOSIS — J9600 Acute respiratory failure, unspecified whether with hypoxia or hypercapnia: Secondary | ICD-10-CM | POA: Diagnosis present

## 2015-10-05 DIAGNOSIS — L259 Unspecified contact dermatitis, unspecified cause: Secondary | ICD-10-CM | POA: Diagnosis not present

## 2015-10-05 DIAGNOSIS — I10 Essential (primary) hypertension: Secondary | ICD-10-CM | POA: Diagnosis present

## 2015-10-05 DIAGNOSIS — J45901 Unspecified asthma with (acute) exacerbation: Secondary | ICD-10-CM | POA: Diagnosis not present

## 2015-10-05 DIAGNOSIS — R05 Cough: Secondary | ICD-10-CM | POA: Diagnosis not present

## 2015-10-05 DIAGNOSIS — N179 Acute kidney failure, unspecified: Secondary | ICD-10-CM | POA: Diagnosis present

## 2015-10-05 DIAGNOSIS — R55 Syncope and collapse: Secondary | ICD-10-CM

## 2015-10-05 DIAGNOSIS — Z6837 Body mass index (BMI) 37.0-37.9, adult: Secondary | ICD-10-CM | POA: Diagnosis not present

## 2015-10-05 DIAGNOSIS — R590 Localized enlarged lymph nodes: Secondary | ICD-10-CM | POA: Diagnosis present

## 2015-10-05 DIAGNOSIS — E669 Obesity, unspecified: Secondary | ICD-10-CM | POA: Diagnosis present

## 2015-10-05 DIAGNOSIS — J9601 Acute respiratory failure with hypoxia: Secondary | ICD-10-CM | POA: Diagnosis not present

## 2015-10-05 DIAGNOSIS — R0602 Shortness of breath: Secondary | ICD-10-CM | POA: Diagnosis not present

## 2015-10-05 DIAGNOSIS — J683 Other acute and subacute respiratory conditions due to chemicals, gases, fumes and vapors: Secondary | ICD-10-CM | POA: Diagnosis present

## 2015-10-05 DIAGNOSIS — J45909 Unspecified asthma, uncomplicated: Secondary | ICD-10-CM | POA: Diagnosis present

## 2015-10-05 LAB — CBC
HCT: 37.1 % (ref 36.0–46.0)
Hemoglobin: 12 g/dL (ref 12.0–15.0)
MCH: 26.4 pg (ref 26.0–34.0)
MCHC: 32.3 g/dL (ref 30.0–36.0)
MCV: 81.5 fL (ref 78.0–100.0)
PLATELETS: 230 10*3/uL (ref 150–400)
RBC: 4.55 MIL/uL (ref 3.87–5.11)
RDW: 16.7 % — ABNORMAL HIGH (ref 11.5–15.5)
WBC: 12.9 10*3/uL — AB (ref 4.0–10.5)

## 2015-10-05 LAB — URINE MICROSCOPIC-ADD ON

## 2015-10-05 LAB — MRSA PCR SCREENING: MRSA by PCR: NEGATIVE

## 2015-10-05 LAB — CBC WITH DIFFERENTIAL/PLATELET
Basophils Absolute: 0 10*3/uL (ref 0.0–0.1)
Basophils Relative: 0 %
EOS ABS: 14.6 10*3/uL — AB (ref 0.0–0.7)
EOS PCT: 60 %
HCT: 37.3 % (ref 36.0–46.0)
Hemoglobin: 12.3 g/dL (ref 12.0–15.0)
Lymphocytes Relative: 14 %
Lymphs Abs: 3.4 10*3/uL (ref 0.7–4.0)
MCH: 26.1 pg (ref 26.0–34.0)
MCHC: 33 g/dL (ref 30.0–36.0)
MCV: 79.2 fL (ref 78.0–100.0)
MONO ABS: 0.7 10*3/uL (ref 0.1–1.0)
Monocytes Relative: 3 %
NEUTROS PCT: 23 %
Neutro Abs: 5.6 10*3/uL (ref 1.7–7.7)
PLATELETS: 240 10*3/uL (ref 150–400)
RBC: 4.71 MIL/uL (ref 3.87–5.11)
RDW: 17 % — AB (ref 11.5–15.5)
WBC: 24.3 10*3/uL — AB (ref 4.0–10.5)

## 2015-10-05 LAB — COMPREHENSIVE METABOLIC PANEL
ALBUMIN: 3.5 g/dL (ref 3.5–5.0)
ALT: 27 U/L (ref 14–54)
ANION GAP: 9 (ref 5–15)
AST: 29 U/L (ref 15–41)
Alkaline Phosphatase: 62 U/L (ref 38–126)
BUN: 24 mg/dL — ABNORMAL HIGH (ref 6–20)
CHLORIDE: 102 mmol/L (ref 101–111)
CO2: 26 mmol/L (ref 22–32)
CREATININE: 1.34 mg/dL — AB (ref 0.44–1.00)
Calcium: 9.1 mg/dL (ref 8.9–10.3)
GFR, EST AFRICAN AMERICAN: 47 mL/min — AB (ref >60)
GFR, EST NON AFRICAN AMERICAN: 41 mL/min — AB (ref >60)
Glucose, Bld: 101 mg/dL — ABNORMAL HIGH (ref 65–99)
Potassium: 3.5 mmol/L (ref 3.5–5.1)
Sodium: 137 mmol/L (ref 135–145)
Total Bilirubin: 0.5 mg/dL (ref 0.3–1.2)
Total Protein: 8 g/dL (ref 6.5–8.1)

## 2015-10-05 LAB — URINALYSIS, ROUTINE W REFLEX MICROSCOPIC
BILIRUBIN URINE: NEGATIVE
GLUCOSE, UA: NEGATIVE mg/dL
Hgb urine dipstick: NEGATIVE
KETONES UR: NEGATIVE mg/dL
Nitrite: NEGATIVE
PH: 6 (ref 5.0–8.0)
Protein, ur: NEGATIVE mg/dL
Specific Gravity, Urine: 1.022 (ref 1.005–1.030)

## 2015-10-05 LAB — I-STAT VENOUS BLOOD GAS, ED
ACID-BASE EXCESS: 3 mmol/L — AB (ref 0.0–2.0)
BICARBONATE: 27.4 meq/L — AB (ref 20.0–24.0)
O2 Saturation: 90 %
TCO2: 29 mmol/L (ref 0–100)
pCO2, Ven: 39 mmHg — ABNORMAL LOW (ref 45.0–50.0)
pH, Ven: 7.454 — ABNORMAL HIGH (ref 7.250–7.300)
pO2, Ven: 55 mmHg — ABNORMAL HIGH (ref 30.0–45.0)

## 2015-10-05 LAB — PROTIME-INR
INR: 1.13 (ref 0.00–1.49)
PROTHROMBIN TIME: 14.7 s (ref 11.6–15.2)

## 2015-10-05 LAB — TROPONIN I

## 2015-10-05 LAB — I-STAT CG4 LACTIC ACID, ED: Lactic Acid, Venous: 1.02 mmol/L (ref 0.5–2.0)

## 2015-10-05 MED ORDER — IPRATROPIUM BROMIDE 0.02 % IN SOLN: 0.5000 mg | Freq: Four times a day (QID) | RESPIRATORY_TRACT | Status: DC

## 2015-10-05 MED ORDER — ALBUTEROL SULFATE (2.5 MG/3ML) 0.083% IN NEBU: 2.5000 mg | INHALATION_SOLUTION | Freq: Four times a day (QID) | RESPIRATORY_TRACT | Status: DC

## 2015-10-05 MED ORDER — METHYLPREDNISOLONE SODIUM SUCC 125 MG IJ SOLR
80.0000 mg | Freq: Four times a day (QID) | INTRAMUSCULAR | Status: DC
  Administered 2015-10-05: 80 mg via INTRAVENOUS
  Filled 2015-10-05: qty 2

## 2015-10-05 MED ORDER — IPRATROPIUM-ALBUTEROL 0.5-2.5 (3) MG/3ML IN SOLN
3.0000 mL | Freq: Once | RESPIRATORY_TRACT | Status: AC
  Administered 2015-10-05: 3 mL via RESPIRATORY_TRACT

## 2015-10-05 MED ORDER — BENZONATATE 100 MG PO CAPS
100.0000 mg | ORAL_CAPSULE | Freq: Two times a day (BID) | ORAL | Status: DC | PRN
  Administered 2015-10-06 (×2): 100 mg via ORAL
  Filled 2015-10-05 (×2): qty 1

## 2015-10-05 MED ORDER — METHYLPREDNISOLONE SODIUM SUCC 125 MG IJ SOLR: 125.0000 mg | Freq: Once | INTRAMUSCULAR | Status: DC

## 2015-10-05 MED ORDER — ALBUTEROL SULFATE (2.5 MG/3ML) 0.083% IN NEBU
2.5000 mg | INHALATION_SOLUTION | Freq: Once | RESPIRATORY_TRACT | Status: AC
  Administered 2015-10-05: 2.5 mg via RESPIRATORY_TRACT

## 2015-10-05 MED ORDER — ONDANSETRON HCL 4 MG PO TABS: 4.0000 mg | ORAL_TABLET | Freq: Four times a day (QID) | ORAL | Status: DC | PRN

## 2015-10-05 MED ORDER — ACETAMINOPHEN 325 MG PO TABS: 650.0000 mg | ORAL_TABLET | Freq: Four times a day (QID) | ORAL | Status: DC | PRN

## 2015-10-05 MED ORDER — HYDROCOD POLST-CPM POLST ER 10-8 MG/5ML PO SUER
5.0000 mL | Freq: Once | ORAL | Status: AC
  Administered 2015-10-05: 5 mL via ORAL
  Filled 2015-10-05: qty 5

## 2015-10-05 MED ORDER — IPRATROPIUM-ALBUTEROL 0.5-2.5 (3) MG/3ML IN SOLN
3.0000 mL | Freq: Four times a day (QID) | RESPIRATORY_TRACT | Status: DC
  Administered 2015-10-05 – 2015-10-06 (×3): 3 mL via RESPIRATORY_TRACT
  Filled 2015-10-05 (×3): qty 3

## 2015-10-05 MED ORDER — GUAIFENESIN ER 600 MG PO TB12
1200.0000 mg | ORAL_TABLET | Freq: Two times a day (BID) | ORAL | Status: DC
  Administered 2015-10-05 – 2015-10-06 (×2): 1200 mg via ORAL
  Filled 2015-10-05 (×3): qty 2

## 2015-10-05 MED ORDER — IPRATROPIUM-ALBUTEROL 0.5-2.5 (3) MG/3ML IN SOLN
RESPIRATORY_TRACT | Status: AC
  Filled 2015-10-05: qty 3

## 2015-10-05 MED ORDER — ONDANSETRON HCL 4 MG/2ML IJ SOLN
4.0000 mg | Freq: Four times a day (QID) | INTRAMUSCULAR | Status: DC | PRN
Start: 2015-10-05 — End: 2015-10-06

## 2015-10-05 MED ORDER — ROSUVASTATIN CALCIUM 10 MG PO TABS
40.0000 mg | ORAL_TABLET | Freq: Every day | ORAL | Status: DC
  Administered 2015-10-05: 40 mg via ORAL
  Filled 2015-10-05: qty 4

## 2015-10-05 MED ORDER — ALBUTEROL SULFATE (2.5 MG/3ML) 0.083% IN NEBU
INHALATION_SOLUTION | RESPIRATORY_TRACT | Status: AC
  Filled 2015-10-05: qty 3

## 2015-10-05 MED ORDER — ALBUTEROL SULFATE (2.5 MG/3ML) 0.083% IN NEBU
2.5000 mg | INHALATION_SOLUTION | RESPIRATORY_TRACT | Status: DC | PRN
  Administered 2015-10-05: 2.5 mg via RESPIRATORY_TRACT
  Filled 2015-10-05: qty 3

## 2015-10-05 MED ORDER — IPRATROPIUM-ALBUTEROL 0.5-2.5 (3) MG/3ML IN SOLN
3.0000 mL | Freq: Once | RESPIRATORY_TRACT | Status: AC
  Administered 2015-10-05: 3 mL via RESPIRATORY_TRACT
  Filled 2015-10-05: qty 3

## 2015-10-05 MED ORDER — IPRATROPIUM-ALBUTEROL 0.5-2.5 (3) MG/3ML IN SOLN
RESPIRATORY_TRACT | Status: AC
  Administered 2015-10-05: 3 mL
  Filled 2015-10-05: qty 3

## 2015-10-05 MED ORDER — ACETAMINOPHEN 650 MG RE SUPP: 650.0000 mg | Freq: Four times a day (QID) | RECTAL | Status: DC | PRN

## 2015-10-05 MED ORDER — SELENIUM SULFIDE 2.5 % EX LOTN: TOPICAL_LOTION | CUTANEOUS | Status: DC

## 2015-10-05 MED ORDER — ALBUTEROL SULFATE (2.5 MG/3ML) 0.083% IN NEBU
INHALATION_SOLUTION | RESPIRATORY_TRACT | Status: AC
  Administered 2015-10-05: 2.5 mg
  Filled 2015-10-05: qty 3

## 2015-10-05 MED ORDER — IOHEXOL 350 MG/ML SOLN
100.0000 mL | Freq: Once | INTRAVENOUS | Status: AC | PRN
  Administered 2015-10-05: 100 mL via INTRAVENOUS

## 2015-10-05 MED ORDER — METHYLPREDNISOLONE SODIUM SUCC 125 MG IJ SOLR
125.0000 mg | Freq: Once | INTRAMUSCULAR | Status: AC
Start: 2015-10-05 — End: 2015-10-05
  Administered 2015-10-05: 125 mg via INTRAVENOUS
  Filled 2015-10-05: qty 2

## 2015-10-05 MED ORDER — ENOXAPARIN SODIUM 30 MG/0.3ML ~~LOC~~ SOLN: 30.0000 mg | SUBCUTANEOUS | Status: DC

## 2015-10-05 MED ORDER — ALUM & MAG HYDROXIDE-SIMETH 200-200-20 MG/5ML PO SUSP: 30.0000 mL | Freq: Four times a day (QID) | ORAL | Status: DC | PRN

## 2015-10-05 MED ORDER — METHYLPREDNISOLONE SODIUM SUCC 40 MG IJ SOLR
40.0000 mg | Freq: Two times a day (BID) | INTRAMUSCULAR | Status: DC
  Administered 2015-10-06: 40 mg via INTRAVENOUS
  Filled 2015-10-05: qty 1

## 2015-10-05 MED ORDER — ENOXAPARIN SODIUM 40 MG/0.4ML ~~LOC~~ SOLN
40.0000 mg | SUBCUTANEOUS | Status: DC
  Administered 2015-10-05: 40 mg via SUBCUTANEOUS
  Filled 2015-10-05: qty 0.4

## 2015-10-05 MED ORDER — TRIAMCINOLONE 0.1 % CREAM:EUCERIN CREAM 1:1
TOPICAL_CREAM | Freq: Three times a day (TID) | CUTANEOUS | Status: DC
  Administered 2015-10-05: 1 via TOPICAL
  Administered 2015-10-06: 10:00:00 via TOPICAL
  Filled 2015-10-05: qty 1

## 2015-10-05 MED ORDER — HYDROCODONE-ACETAMINOPHEN 5-325 MG PO TABS
1.0000 | ORAL_TABLET | ORAL | Status: DC | PRN
  Administered 2015-10-06 (×2): 2 via ORAL
  Filled 2015-10-05 (×2): qty 2

## 2015-10-05 MED ORDER — METHYLPREDNISOLONE SODIUM SUCC 125 MG IJ SOLR: 60.0000 mg | Freq: Three times a day (TID) | INTRAMUSCULAR | Status: DC

## 2015-10-05 NOTE — ED Notes (Signed)
Pt reports 2 weeks of non productive cough, "I lose my breath when I have a coughing spell..." pt daughter reports pt had syncopal episode approx 20 min pta. Pt is awake, alert, denies any pain, states "I just feel so wheezy!" audible wheezes noted, rt is at bedside for assessment and ekg is in progress.

## 2015-10-05 NOTE — Progress Notes (Signed)
Pre visit review using our clinic review tool, if applicable. No additional management support is needed unless otherwise documented below in the visit note. 
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
## 2015-10-05 NOTE — Patient Instructions (Signed)
Please proceed to the ER for further evaluation.  They are expecting you. Start selenium lotion around the base of your neck for possible fungal rash. Purchase long latex free gloves to use while washing dishes to avoid contact with the chemical which is bothering your skin. Follow up in 1-2 weeks.
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
## 2015-10-05 NOTE — ED Notes (Signed)
Patient transported to X-ray and ct 

## 2015-10-05 NOTE — ED Notes (Signed)
[facility] at bedside for transport. 

## 2015-10-05 NOTE — Progress Notes (Signed)
Progress note  Suzanne Ali is a 64 year old female who was referred from her primary care's office to [facility] for further workup of wheezing. Patient denied having a history of COPD or asthma. She complained of a two-week history of increasing shortness of breath associate with wheezing, receiving beta agonist, steroid injection and Z-Pak. Despite this intervention there has not been improvement to her symptoms. She also had a syncopal event earlier today. She has been accepted to the step down unit.

## 2015-10-05 NOTE — Consult Note (Signed)
Name: Suzanne Ali MRN: [id number] DOB: 06/09/1951    ADMISSION DATE:  10/05/2015 CONSULTATION DATE:  10/05/15  REFERRING MD :  Tyrell Antonio  CHIEF COMPLAINT:  SOB, cough  BRIEF PATIENT DESCRIPTION: 64 y.o. female  Brought to [facility] as direct admit from PCP office.  She has been having cough and SOB for the past few days.  CTA revealed bilateral hilar and mediastinal lymph nodes and pt has had rash to hands/arms.  PCCM called with concern for sarcoidosis.  SIGNIFICANT EVENTS  11/23 - admit  STUDIES:  CTA chest 11/23 >>> negative for PE but did reveal mediastinal and bilateral hilar lymph nodes.  There was also a 63mm LLL pulmonary nodule.   HISTORY OF PRESENT ILLNESS:  Suzanne Ali is a 64 y.o. female with a PMH of HTN and CVA.  She was at her PCP office 11/23 when she slumped over and fell over.  She immediately regained consciousness; however, PCP was concerned; therefore, sent her to hospital as direct admit.  She has had cough and SOB for the past 3 - 4 days.  She had no fevers/chills/sweats, chest pain, N/V/D, abd pain, myalgias, visual change, dry eyes/mouth.  She has also had a rash on her hands/arms as well as chest.  She does report exposure to known skin irritant in the form of floor washing detergent.  Apparently, somebody at her job mistakenly connected the floor detergent into the dish detergent outlet, and when she went to wash the dishes, she was exposed to an aggressive floor detergent.  She has a clear line of demarcation where her sleeve ends (favoring a contact dermatitis).  She does report that this has happened to her in the past, but it was not as aggressive.  CT of the chest was negative for PE but did reveal mediastinal and bilateral hilar lymph nodes.  There was also a 87mm LLL pulmonary nodule.  She has never been a smoker and no significant second hand smoke exposure.  PAST MEDICAL HISTORY :   has a past medical history of Hypertension and Stroke ([place]).  has past surgical  history that includes Leg Surgery. Prior to Admission medications   Medication Sig Start Date End Date Taking? Authorizing Provider  acetaminophen (TYLENOL) 325 MG tablet Take 650 mg by mouth every 6 (six) hours as needed for mild pain, moderate pain or headache.   Yes Historical Provider, MD  albuterol (PROVENTIL HFA;VENTOLIN HFA) 108 (90 BASE) MCG/ACT inhaler Inhale 2 puffs into the lungs every 6 (six) hours as needed for wheezing or shortness of breath.   Yes Historical Provider, MD  benzonatate (TESSALON) 200 MG capsule Take 200 mg by mouth 3 (three) times daily as needed for cough.   Yes Historical Provider, MD  clopidogrel (PLAVIX) 75 MG tablet TAKE 1 TABLET BY MOUTH ONCE DAILY Patient taking differently: TAKE 75 MG BY MOUTH ONCE DAILY 04/06/15  Yes [name], NP  hydrochlorothiazide (HYDRODIURIL) 25 MG tablet TAKE 1 TABLET (25 MG TOTAL) BY MOUTH DAILY. 04/06/15  Yes [name], NP  metoprolol succinate (TOPROL-XL) 25 MG 24 hr tablet TAKE 2 TABLETS (50 MG TOTAL) BY MOUTH DAILY. Patient taking differently: TAKE 25 MG BY MOUTH DAILY 04/06/15  Yes [name], NP  rosuvastatin (CRESTOR) 40 MG tablet Take 1 tablet (40 mg total) by mouth daily. Patient taking differently: Take 40 mg by mouth at bedtime.  04/22/15  Yes [name], NP  selenium sulfide (SELSUN) 2.5 % shampoo Apply lotion around base of neck. Leave on skin  for 10 minutes, then rinse thoroughly; apply every day for 7 days 10/05/15  Yes [name], NP  triamcinolone cream (KENALOG) 0.5 % Apply 1 application topically 2 (two) times daily.   Yes Historical Provider, MD   ALLERGIES:  BENICAR (hair loss, dry skin), PENICILLIN (hives), SULFA (hives).  FAMILY HISTORY:  family history includes Cancer in her father; Diabetes in her father; Heart disease in her mother; Hypertension in her mother. SOCIAL HISTORY:  reports that she has never smoked. She does not have any smokeless tobacco history on file.  She reports that she does not drink alcohol or use illicit drugs.  REVIEW OF SYSTEMS:   All negative; except for those that are bolded, which indicate positives.  Constitutional: weight loss, weight gain, night sweats, fevers, chills, fatigue, weakness.  HEENT: headaches, sore throat, sneezing, nasal congestion, post nasal drip, difficulty swallowing, tooth/dental problems, visual complaints, visual changes, ear aches. Neuro: difficulty with speech, weakness, numbness, ataxia. CV:  chest pain, orthopnea, PND, swelling in lower extremities, dizziness, palpitations, syncope.  Resp: dry cough, hemoptysis, dyspnea, wheezing. GI  heartburn, indigestion, abdominal pain, nausea, vomiting, diarrhea, constipation, change in bowel habits, loss of appetite, hematemesis, melena, hematochezia.  GU: dysuria, change in color of urine, urgency or frequency, flank pain, hematuria. MSK: joint pain or swelling, decreased range of motion. Psych: change in mood or affect, depression, anxiety, suicidal ideations, homicidal ideations. Skin: rash to bilateral arms / hands, itching, bruising.    SUBJECTIVE:  She denies chest pain, SOB, fevers/chills/sweats.  States her cough is still bothering her.  VITAL SIGNS: Temp:  [98 F (36.7 C)-98.7 F (37.1 C)] 98.7 F (37.1 C) (11/23 1641) Pulse Rate:  [78-102] 97 (11/23 1822) Resp:  [18-27] 18 (11/23 1822) BP: (123-142)/(66-88) 131/77 mmHg (11/23 1822) SpO2:  [96 %-100 %] 96 % (11/23 1822) Weight:  [97.8 kg (215 lb 9.8 oz)-98.521 kg (217 lb 3.2 oz)] 97.8 kg (215 lb 9.8 oz) (11/23 1342)  PHYSICAL EXAMINATION: General: Adult AA female, resting in bed, in NAD. Neuro: A&O x 3, non-focal.  HEENT: [place]/AT. PERRL, sclerae anicteric. Cardiovascular: RRR, no M/R/G.  Lungs: Respirations even and unlabored.  CTA bilaterally, No W/R/R. Abdomen: Obese, BS x 4, soft, NT/ND.  Musculoskeletal: No gross deformities, no edema.  Skin: Intact, warm.  Bilateral hands and arms with  urticarial type rash.  There is a clear line of demarcation at [facility] fossa bilaterally.     Recent Labs Lab 10/05/15 0900  NA 137  K 3.5  CL 102  CO2 26  BUN 24*  CREATININE 1.34*  GLUCOSE 101*    Recent Labs Lab 10/05/15 0900 10/05/15 1645  HGB 12.3 12.0  HCT 37.3 37.1  WBC 24.3* 12.9*  PLT 240 230   Dg Chest 2 View  10/05/2015  CLINICAL DATA:  Shortness of breath, cough, wheezing EXAM: CHEST  2 VIEW COMPARISON:  12/11/2012 FINDINGS: Heart and mediastinal contours are within normal limits. No focal opacities or effusions. No acute bony abnormality. IMPRESSION: No active cardiopulmonary disease. Electronically Signed   By: Rolm Baptise M.D.   On: 10/05/2015 10:45   Ct Angio Chest Pe W/cm &/or Wo Cm  10/05/2015  CLINICAL DATA:  Shortness of breath, cough, wheezing for 1-2 weeks. EXAM: CT ANGIOGRAPHY CHEST WITH CONTRAST TECHNIQUE: Multidetector CT imaging of the chest was performed using the standard protocol during bolus administration of intravenous contrast. Multiplanar CT image reconstructions and MIPs were obtained to evaluate the vascular anatomy. CONTRAST:  112mL OMNIPAQUE IOHEXOL 350 MG/ML SOLN  COMPARISON:  Chest x-ray earlier today. FINDINGS: No filling defects in the pulmonary arteries to suggest pulmonary emboli. Heart is normal size. Aorta is normal caliber. There are mildly prominent mediastinal and bilateral hilar lymph nodes, nonspecific. While these could be reactive, cannot completely exclude an inflammatory process such as sarcoidosis. 7 mm nodule peripherally in the left lower lobe on image 44. No confluent airspace opacities in the lungs. No pleural effusions. No acute bony abnormality or focal bone lesion. Imaging into the upper abdomen shows no acute findings. Review of the MIP images confirms the above findings. IMPRESSION: No evidence of pulmonary embolus. Mildly prominent mediastinal and bilateral hilar lymph nodes, nonspecific. These could be reactive. Cannot  exclude inflammatory process such as sarcoidosis. 7 mm left lower lobe pulmonary nodule. If the patient is at high risk for bronchogenic carcinoma, follow-up chest CT at 3-23months is recommended. If the patient is at low risk for bronchogenic carcinoma, follow-up chest CT at 6-12 months is recommended. This recommendation follows the consensus statement: Guidelines for Management of Small Pulmonary Nodules Detected on CT Scans: A Statement from the [place] as published in Radiology 2005; 237:395-400. Electronically Signed   By: Rolm Baptise M.D.   On: 10/05/2015 10:56    ASSESSMENT / PLAN:  Bronchitis - CTA did show bilateral hilar and mediastinal lymphadenopathy and pt has rash on hands/arms with clear line of demarcation; however, she does have known exposure to skin irritant; therefore, this is likely due to contact dermatitis.  Doubt sarcoidosis. 57mm LLL pulmonary nodule. Plan: Continue BD's, tessalon pearls. F/u ACE level. Please ensure pt has a repeat chest CT in 3 - 6 months.  If nodule remains or increases in size, will likely need biopsy.  Contact dermatitis. Plan: Continue IV solumedrol, dose adjusted. Add triamcinolone - eucerin cream. Pt educated on avoidance of known skin irritant.  Rest per primary team.   Montey Hora, [place] Pager: [phone]  or [phone] 10/05/2015, 8:38 PM  Attending Note:  64 year old female with PMH above who evidently was exposed to a floor cleaner that was accidentally connected to the dishwashing fluid she uses at work and started developing this rash a month ago. She evidently had a very similar exposure prior and resolved with steroid cream and no further exposure to that particular chemical. She evidently also had a similar cough when previously exposed to that chemical. Also resolved with steroids. The patient had a CT of the chest done that I reviewed myself and LLL nodule  was noted as well as mediastinal LAN. Suspicion for sarcoid was brought up and PCCM was called. Patient is a none smoker life long and has had no symptoms to suggest cancer at this time. I suspect these nodes are reactive in nature. Lungs clear on exam but decreased BS at the bases. Discussed with PCCM-PA and Dr. Lamonte Sakai.  Lung nodule: doubtful to be cancer. - Repeat CT in 3 months, if present or enlarging will need biopsy. - F/U in 3 months with PCP and if present please refer to pulmonary.  Mediastinal LAN: suspect reactive but sarcoid is a concern however remote. - ACE level. - ESR. - CRP. - F/U imaging, if present in 3 months then will need an EBUS. - If above markers are concerning will reconsider.  Cough: suspect reactive to the exposure.  - Bronchodilators. - Will need PFTs when better to sort out this carried diagnosis of  obstructive lung disease. - Steroids, decrease solumedrol to 40 mg IV q12 when change PO at the first sign of improvement.  Rash: Contact dermatitis from exposure, not sarcoid. - Steroids as above. - Add steroid cream to the affected areas. - Monitor clinically.  PCCM will follow with you.  Patient seen and examined, agree with above note. I dictated the care and orders written for this patient under my direction.  Rush Farmer, MD [phone].

## 2015-10-05 NOTE — ED Notes (Signed)
Report to [facility] staff at bedside, pt care transferred to [facility] r[facility]. Pt is awake and alert, denies any c/o.
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
## 2015-10-06 ENCOUNTER — Telehealth: Payer: Self-pay | Admitting: Internal Medicine

## 2015-10-06 DIAGNOSIS — J9601 Acute respiratory failure with hypoxia: Secondary | ICD-10-CM

## 2015-10-06 DIAGNOSIS — J683 Other acute and subacute respiratory conditions due to chemicals, gases, fumes and vapors: Secondary | ICD-10-CM

## 2015-10-06 DIAGNOSIS — R590 Localized enlarged lymph nodes: Secondary | ICD-10-CM

## 2015-10-06 DIAGNOSIS — Z8673 Personal history of transient ischemic attack (TIA), and cerebral infarction without residual deficits: Secondary | ICD-10-CM

## 2015-10-06 DIAGNOSIS — J45901 Unspecified asthma with (acute) exacerbation: Secondary | ICD-10-CM

## 2015-10-06 LAB — CBC
HEMATOCRIT: 36 % (ref 36.0–46.0)
Hemoglobin: 11.6 g/dL — ABNORMAL LOW (ref 12.0–15.0)
MCH: 26.1 pg (ref 26.0–34.0)
MCHC: 32.2 g/dL (ref 30.0–36.0)
MCV: 81.1 fL (ref 78.0–100.0)
Platelets: 227 10*3/uL (ref 150–400)
RBC: 4.44 MIL/uL (ref 3.87–5.11)
RDW: 16.8 % — AB (ref 11.5–15.5)
WBC: 18 10*3/uL — AB (ref 4.0–10.5)

## 2015-10-06 LAB — BASIC METABOLIC PANEL
Anion gap: 11 (ref 5–15)
BUN: 29 mg/dL — AB (ref 6–20)
CHLORIDE: 102 mmol/L (ref 101–111)
CO2: 23 mmol/L (ref 22–32)
Calcium: 9.3 mg/dL (ref 8.9–10.3)
Creatinine, Ser: 1.42 mg/dL — ABNORMAL HIGH (ref 0.44–1.00)
GFR calc Af Amer: 44 mL/min — ABNORMAL LOW (ref >60)
GFR calc non Af Amer: 38 mL/min — ABNORMAL LOW (ref >60)
GLUCOSE: 131 mg/dL — AB (ref 65–99)
POTASSIUM: 4.1 mmol/L (ref 3.5–5.1)
SODIUM: 136 mmol/L (ref 135–145)

## 2015-10-06 LAB — URINE CULTURE

## 2015-10-06 MED ORDER — BUDESONIDE-FORMOTEROL FUMARATE 160-4.5 MCG/ACT IN AERO
2.0000 | INHALATION_SPRAY | Freq: Two times a day (BID) | RESPIRATORY_TRACT | Status: DC
  Filled 2015-10-06: qty 6

## 2015-10-06 MED ORDER — BUDESONIDE-FORMOTEROL FUMARATE 160-4.5 MCG/ACT IN AERO: 2.0000 | INHALATION_SPRAY | Freq: Two times a day (BID) | RESPIRATORY_TRACT | Status: DC

## 2015-10-06 MED ORDER — ALBUTEROL SULFATE HFA 108 (90 BASE) MCG/ACT IN AERS: 2.0000 | INHALATION_SPRAY | Freq: Four times a day (QID) | RESPIRATORY_TRACT | Status: DC | PRN

## 2015-10-06 MED ORDER — IPRATROPIUM-ALBUTEROL 0.5-2.5 (3) MG/3ML IN SOLN: 3.0000 mL | Freq: Four times a day (QID) | RESPIRATORY_TRACT | Status: DC | PRN

## 2015-10-06 MED ORDER — PREDNISONE 10 MG PO TABS: 10.0000 mg | ORAL_TABLET | Freq: Every day | ORAL | Status: DC

## 2015-10-06 MED ORDER — METOPROLOL SUCCINATE ER 25 MG PO TB24: ORAL_TABLET | ORAL | Status: DC

## 2015-10-06 NOTE — Discharge Instructions (Addendum)
Follow with O'SULLIVAN,MELISSA S., NP in 5-7 days  Take prednisone taper: 40 mg (4 tabs) daily for 4 days then 3 tabs daily for 4 days then 2 tabs daily for 4 days then 1 tab daily for 2 days then stop.  Please get a complete blood count and chemistry panel checked by your Primary MD at your next visit, and again as instructed by your Primary MD. Please get your medications reviewed and adjusted by your Primary MD.  Please request your Primary MD to go over all Hospital Tests and Procedure/Radiological results at the follow up, please get all Hospital records sent to your Prim MD by signing hospital release before you go home.  If you had Pneumonia of Lung problems at the Hospital: Please get a 2 view Chest X ray done in 6-8 weeks after hospital discharge or sooner if instructed by your Primary MD.  If you have Congestive Heart Failure: Please call your Cardiologist or Primary MD anytime you have any of the following symptoms:  1) 3 pound weight gain in 24 hours or 5 pounds in 1 week  2) shortness of breath, with or without a dry hacking cough  3) swelling in the hands, feet or stomach  4) if you have to sleep on extra pillows at night in order to breathe  Follow cardiac low salt diet and 1.5 lit/day fluid restriction.  If you have diabetes Accuchecks 4 times/day, Once in AM empty stomach and then before each meal. Log in all results and show them to your primary doctor at your next visit. If any glucose reading is under 80 or above 300 call your primary MD immediately.  If you have Seizure/Convulsions/Epilepsy: Please do not drive, operate heavy machinery, participate in activities at heights or participate in high speed sports until you have seen by Primary MD or a Neurologist and advised to do so again.  If you had Gastrointestinal Bleeding: Please ask your Primary MD to check a complete blood count within one week of discharge or at your next visit. Your endoscopic/colonoscopic  biopsies that are pending at the time of discharge, will also need to followed by your Primary MD.  Get Medicines reviewed and adjusted. Please take all your medications with you for your next visit with your Primary MD  Please request your Primary MD to go over all hospital tests and procedure/radiological results at the follow up, please ask your Primary MD to get all Hospital records sent to his/her office.  If you experience worsening of your admission symptoms, develop shortness of breath, life threatening emergency, suicidal or homicidal thoughts you must seek medical attention immediately by calling 911 or calling your MD immediately  if symptoms less severe.  You must read complete instructions/literature along with all the possible adverse reactions/side effects for all the Medicines you take and that have been prescribed to you. Take any new Medicines after you have completely understood and accpet all the possible adverse reactions/side effects.   Do not drive or operate heavy machinery when taking Pain medications.   Do not take more than prescribed Pain, Sleep and Anxiety Medications  Special Instructions: If you have smoked or chewed Tobacco  in the last 2 yrs please stop smoking, stop any regular Alcohol  and or any Recreational drug use.  Wear Seat belts while driving.  Please note You were cared for by a hospitalist during your hospital stay. If you have any questions about your discharge medications or the care you received while  you were in the hospital after you are discharged, you can call the unit and asked to speak with the hospitalist on call if the hospitalist that took care of you is not available. Once you are discharged, your primary care physician will handle any further medical issues. Please note that NO REFILLS for any discharge medications will be authorized once you are discharged, as it is imperative that you return to your primary care physician (or establish a  relationship with a primary care physician if you do not have one) for your aftercare needs so that they can reassess your need for medications and monitor your lab values.  You can reach the hospitalist office at phone [phone] or fax [phone]   If you do not have a primary care physician, you can call [phone] for a physician referral.  Activity: As tolerated with Full fall precautions use walker/cane & assistance as needed   Disposition Home
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
## 2015-10-06 NOTE — Discharge Summary (Signed)
Physician Discharge Summary  Suzanne Ali [id number] DOB: 1951/08/21 DOA: 10/05/2015  PCP: [name], NP  Admit date: 10/05/2015 Discharge date: 10/06/2015  Time spent: > 30 minutes  Recommendations for Outpatient Follow-up:  1. Follow up with [name] as scheduled 2. Follow up with Dr. Chase Caller in 1 week   Discharge Diagnoses:  Active Problems:   Reactive airway disease   Acute respiratory failure (HCC)   History of CVA (cerebrovascular accident)   Contact dermatitis   Acute renal failure (HCC)   Reactive airways dysfunction syndrome with acute exacerbation   Mediastinal adenopathy  Discharge Condition: stable  Diet recommendation: heart healthy  Filed Weights   10/05/15 1342 10/06/15 0347  Weight: 97.8 kg (215 lb 9.8 oz) 96.8 kg (213 lb 6.5 oz)    History of present illness:  See H&P, Labs, Consult and Test reports for all details in brief, patient is a 64 y.o. female with past medical history significant for hypertension, obesity and stroke(2 years ago), admitted with a possible syncopal episodes in the setting of coughing and progressive shortness of breath as well as wheezing.  Hospital Course:  Patient was admitted to the hospital with acute respiratory failure with significant wheezing and shortness of breath. She has no known history of COPD or asthma. She had a CT scan in the ED without evidence of pulmonary embolus,however it did show mildly prominent mediastinal and bilateral hilar lymph nodes, raising suspicion of sarcoidosis. Pulmonary was consulted and have followed patient while hospitalized. She was started on IV steroids, breathing treatments, with subsequent improvement in her respiratory status. On the day of discharge, patient was feeling back to baseline, she no longer had any dyspnea, she was able to ambulate without issues, and asking to go home. I discussed her case with Dr. Chase Caller on the day of discharge, she is likely to  have reactive airway dysfunction syndrome, patient will be seen in pulmonary clinic for further testing and evaluation. She was placed on Symbicort, prednisone taper for 2 weeks as well as albuterol as needed at home. Her other medical issues have remained stable, and no further changes were made to her chronic medications.  Procedures:  None   Consultations:  Pulmonology   Discharge Exam: Filed Vitals:   10/06/15 0400 10/06/15 0819 10/06/15 0907 10/06/15 1213  BP:  148/88  164/80  Pulse:  81 79 79  Temp: 97.9 F (36.6 C) 98.5 F (36.9 C)  97.8 F (36.6 C)  TempSrc: Oral Oral  Oral  Resp:  16 15 15   Height:      Weight:      SpO2:  98% 97% 98%    General: NAD Cardiovascular: RRR Respiratory: moves air well, minimal wheezing  Discharge Instructions Activity:  As tolerated   Get Medicines reviewed and adjusted: Please take all your medications with you for your next visit with your Primary MD  Please request your Primary MD to go over all hospital tests and procedure/radiological results at the follow up, please ask your Primary MD to get all Hospital records sent to his/her office.  If you experience worsening of your admission symptoms, develop shortness of breath, life threatening emergency, suicidal or homicidal thoughts you must seek medical attention immediately by calling 911 or calling your MD immediately if symptoms less severe.  You must read complete instructions/literature along with all the possible adverse reactions/side effects for all the Medicines you take and that have been prescribed to you. Take any new Medicines after you have  completely understood and accpet all the possible adverse reactions/side effects.   Do not drive when taking Pain medications.   Do not take more than prescribed Pain, Sleep and Anxiety Medications  Special Instructions: If you have smoked or chewed Tobacco in the last 2 yrs please stop smoking, stop any regular Alcohol and  or any Recreational drug use.  Wear Seat belts while driving.  Please note  You were cared for by a hospitalist during your hospital stay. Once you are discharged, your primary care physician will handle any further medical issues. Please note that NO REFILLS for any discharge medications will be authorized once you are discharged, as it is imperative that you return to your primary care physician (or establish a relationship with a primary care physician if you do not have one) for your aftercare needs so that they can reassess your need for medications and monitor your lab values.    Medication List    TAKE these medications        acetaminophen 325 MG tablet  Commonly known as:  TYLENOL  Take 650 mg by mouth every 6 (six) hours as needed for mild pain, moderate pain or headache.     albuterol 108 (90 BASE) MCG/ACT inhaler  Commonly known as:  PROVENTIL HFA;VENTOLIN HFA  Inhale 2 puffs into the lungs every 6 (six) hours as needed for wheezing or shortness of breath.     benzonatate 200 MG capsule  Commonly known as:  TESSALON  Take 200 mg by mouth 3 (three) times daily as needed for cough.     budesonide-formoterol 160-4.5 MCG/ACT inhaler  Commonly known as:  SYMBICORT  Inhale 2 puffs into the lungs 2 (two) times daily.     clopidogrel 75 MG tablet  Commonly known as:  PLAVIX  TAKE 1 TABLET BY MOUTH ONCE DAILY     hydrochlorothiazide 25 MG tablet  Commonly known as:  HYDRODIURIL  TAKE 1 TABLET (25 MG TOTAL) BY MOUTH DAILY.     metoprolol succinate 25 MG 24 hr tablet  Commonly known as:  TOPROL-XL  TAKE 25 MG BY MOUTH DAILY     predniSONE 10 MG tablet  Commonly known as:  DELTASONE  Take 1 tablet (10 mg total) by mouth daily with breakfast. 40 mg (4 tabs) daily for 4 days then 3 tabs daily for 4 days then 2 tabs daily for 4 days then 1 tab daily for 2 days then stop.     rosuvastatin 40 MG tablet  Commonly known as:  CRESTOR  Take 1 tablet (40 mg total) by mouth daily.      selenium sulfide 2.5 % shampoo  Commonly known as:  SELSUN  Apply lotion around base of neck. Leave on skin for 10 minutes, then rinse thoroughly; apply every day for 7 days     triamcinolone cream 0.5 %  Commonly known as:  KENALOG  Apply 1 application topically 2 (two) times daily.           Follow-up Information    Follow up with [name], NP. Schedule an appointment as soon as possible for a visit in 2 weeks.   Specialty:  Internal Medicine   Contact information:   [place] [address] [phone]       Follow up with [facility], MD. Schedule an appointment as soon as possible for a visit in 2 weeks.   Specialty:  Pulmonary Disease   Contact information:   [id number] [place] [address] [phone]       The results of significant diagnostics from this hospitalization (including imaging, microbiology, ancillary and laboratory) are listed below for reference.    Significant Diagnostic Studies: Dg Chest 2 View  10/05/2015  CLINICAL DATA:  Shortness of breath, cough, wheezing EXAM: CHEST  2 VIEW COMPARISON:  12/11/2012 FINDINGS: Heart and mediastinal contours are within normal limits. No focal opacities or effusions. No acute bony abnormality. IMPRESSION: No active cardiopulmonary disease. Electronically Signed   By: Rolm Baptise M.D.   On: 10/05/2015 10:45   Ct Angio Chest Pe W/cm &/or Wo Cm  10/05/2015  CLINICAL DATA:  Shortness of breath, cough, wheezing for 1-2 weeks. EXAM: CT ANGIOGRAPHY CHEST WITH CONTRAST TECHNIQUE: Multidetector CT imaging of the chest was performed using the standard protocol during bolus administration of intravenous contrast. Multiplanar CT image reconstructions and MIPs were obtained to evaluate the vascular anatomy. CONTRAST:  139mL OMNIPAQUE IOHEXOL 350 MG/ML SOLN COMPARISON:  Chest x-ray earlier today. FINDINGS: No filling defects in the pulmonary arteries to suggest pulmonary emboli.  Heart is normal size. Aorta is normal caliber. There are mildly prominent mediastinal and bilateral hilar lymph nodes, nonspecific. While these could be reactive, cannot completely exclude an inflammatory process such as sarcoidosis. 7 mm nodule peripherally in the left lower lobe on image 44. No confluent airspace opacities in the lungs. No pleural effusions. No acute bony abnormality or focal bone lesion. Imaging into the upper abdomen shows no acute findings. Review of the MIP images confirms the above findings. IMPRESSION: No evidence of pulmonary embolus. Mildly prominent mediastinal and bilateral hilar lymph nodes, nonspecific. These could be reactive. Cannot exclude inflammatory process such as sarcoidosis. 7 mm left lower lobe pulmonary nodule. If the patient is at high risk for bronchogenic carcinoma, follow-up chest CT at 3-57months is recommended. If the patient is at low risk for bronchogenic carcinoma, follow-up chest CT at 6-12 months is recommended. This recommendation follows the consensus statement: Guidelines for Management of Small Pulmonary Nodules Detected on CT Scans: A Statement from the [employer] as published in Radiology 2005; 237:395-400. Electronically Signed   By: Rolm Baptise M.D.   On: 10/05/2015 10:56    Microbiology: Recent Results (from the past 240 hour(s))  Urine culture     Status: None   Collection Time: 10/05/15 10:15 AM  Result Value Ref Range Status   Specimen Description URINE, CLEAN CATCH  Final   Special Requests NONE  Final   Culture   Final    MULTIPLE SPECIES PRESENT, SUGGEST RECOLLECTION Performed at [facility]    Report Status 10/06/2015 FINAL  Final  MRSA PCR Screening     Status: None   Collection Time: 10/05/15  1:41 PM  Result Value Ref Range Status   MRSA by PCR NEGATIVE NEGATIVE Final    Comment:        The GeneXpert MRSA Assay (FDA approved for NASAL specimens only), is one component of a comprehensive MRSA  colonization surveillance program. It is not intended to diagnose MRSA infection nor to guide or monitor treatment for MRSA infections.      Labs: Basic Metabolic Panel:  Recent Labs Lab 10/05/15 0900 10/06/15 0341  NA 137 136  K 3.5 4.1  CL 102 102  CO2 26 23  GLUCOSE 101* 131*  BUN 24* 29*  CREATININE 1.34* 1.42*  CALCIUM 9.1 9.3   Liver Function Tests:  Recent Labs Lab 10/05/15 0900  AST 29  ALT 27  ALKPHOS 62  BILITOT 0.5  PROT 8.0  ALBUMIN 3.5   CBC:  Recent Labs Lab 10/05/15 0900 10/05/15 1645 10/06/15 0341  WBC 24.3* 12.9* 18.0*  NEUTROABS 5.6  --   --   HGB 12.3 12.0 11.6*  HCT 37.3 37.1 36.0  MCV 79.2 81.5 81.1  PLT 240 230 227   Cardiac Enzymes:  Recent Labs Lab 10/05/15 0900  TROPONINI <0.03     Signed:  Marzetta Board  [facility] 10/06/2015, 2:09 PM

## 2015-10-06 NOTE — Progress Notes (Signed)
Pt d/c'd home with son. IV and tele d/c'd. No c/o pain. D/c instructions and prescriptions were reviewed and given. All questions answered. Pt verbalized understanding. No belongings left in the room.

## 2015-10-06 NOTE — Telephone Encounter (Signed)
Triage  She has RADS/Asthma attack. [facility] 10/06/2015  Give fu with me or TP in 1 week . Needs spiro at fu +/- FeNO  Dr. Brand Males, M.D., [facility].C.P Pulmonary and Critical Care Medicine Staff Physician [facility] Pager: [phone], If no answer or between  15:00h - 7:00h: call [phone]  10/06/2015 12:22 PM

## 2015-10-07 ENCOUNTER — Telehealth: Payer: Self-pay | Admitting: Family

## 2015-10-07 LAB — PATHOLOGIST SMEAR REVIEW: PATH REVIEW: INCREASED

## 2015-10-07 NOTE — Telephone Encounter (Signed)
Called spoke with pt. appt scheduled to see MR Monday. Nothing further needed

## 2015-10-07 NOTE — Telephone Encounter (Signed)
Please arrange transitional care follow up.

## 2015-10-08 LAB — ANGIOTENSIN CONVERTING ENZYME: ANGIOTENSIN-CONVERTING ENZYME: 43 U/L (ref 14–82)

## 2015-10-10 ENCOUNTER — Telehealth: Payer: Self-pay | Admitting: Family

## 2015-10-10 ENCOUNTER — Ambulatory Visit: Payer: Medicare Other | Admitting: Internal Medicine

## 2015-10-10 NOTE — Telephone Encounter (Signed)
Also, please advise daughter on time/date/location of pulmonary appointment.

## 2015-10-10 NOTE — Telephone Encounter (Signed)
Suzanne Ali Ph# [id number]  Pt daughter said we called and scheduled appt for pt to come here today at 12:00pm. It appears appt was scheduled by [facility]/Triage nurse on 10/07/15 for [facility]. Pt came in and [place] informed her that her appt was today 10/10/15 12:00pm with Dr. Chase Caller. Marj called [facility] office and notified them pt was here. Per chart pt was rescheduled for 12/15 at Pulmonary. Pt daughter is very upset that she was not seen today by Lenna Sciara (as pt came in for appt at 12:00pm) and states we sent pt downstairs to [facility] ER 11/23 bc she passed out in our office. She is requesting call back urgently.

## 2015-10-10 NOTE — Telephone Encounter (Signed)
Spoke with daughter, concerns addressed. Will have rn arrange hospital follow up apt with me.  She reports that her mother is feeling better.

## 2015-10-10 NOTE — Telephone Encounter (Signed)
Hospital follow up appointments for here and Pulmonary have been scheduled and patient/daughter notified.

## 2015-10-10 NOTE — Telephone Encounter (Signed)
Left message for callback from daughter or patient regarding appointments for patie.nt

## 2015-10-18 ENCOUNTER — Encounter: Payer: Self-pay | Admitting: Family

## 2015-10-18 ENCOUNTER — Ambulatory Visit (INDEPENDENT_AMBULATORY_CARE_PROVIDER_SITE_OTHER): Payer: Medicare Other | Admitting: Family

## 2015-10-18 ENCOUNTER — Telehealth: Payer: Self-pay | Admitting: *Deleted

## 2015-10-18 VITALS — BP 135/81 | HR 74 | Temp 98.3°F | Resp 16 | Ht 61.0 in | Wt 216.0 lb

## 2015-10-18 DIAGNOSIS — D649 Anemia, unspecified: Secondary | ICD-10-CM

## 2015-10-18 DIAGNOSIS — N289 Disorder of kidney and ureter, unspecified: Secondary | ICD-10-CM

## 2015-10-18 DIAGNOSIS — N179 Acute kidney failure, unspecified: Secondary | ICD-10-CM

## 2015-10-18 DIAGNOSIS — R55 Syncope and collapse: Secondary | ICD-10-CM

## 2015-10-18 DIAGNOSIS — J9601 Acute respiratory failure with hypoxia: Secondary | ICD-10-CM

## 2015-10-18 DIAGNOSIS — I10 Essential (primary) hypertension: Secondary | ICD-10-CM

## 2015-10-18 DIAGNOSIS — H547 Unspecified visual loss: Secondary | ICD-10-CM

## 2015-10-18 HISTORY — DX: Syncope and collapse: R55

## 2015-10-18 LAB — BASIC METABOLIC PANEL
BUN: 21 mg/dL (ref 6–23)
CALCIUM: 9.7 mg/dL (ref 8.4–10.5)
CO2: 25 meq/L (ref 19–32)
Chloride: 103 mEq/L (ref 96–112)
Creatinine, Ser: 1.12 mg/dL (ref 0.40–1.20)
GFR: 62.67 mL/min (ref >60.00)
GLUCOSE: 104 mg/dL — AB (ref 70–99)
POTASSIUM: 4 meq/L (ref 3.5–5.1)
SODIUM: 139 meq/L (ref 135–145)

## 2015-10-18 LAB — CBC WITH DIFFERENTIAL/PLATELET
Basophils Absolute: 0.1 10*3/uL (ref 0.0–0.1)
Basophils Relative: 0.4 % (ref 0.0–3.0)
EOS PCT: 1.1 % (ref 0.0–5.0)
Eosinophils Absolute: 0.2 10*3/uL (ref 0.0–0.7)
HEMATOCRIT: 44.9 % (ref 36.0–46.0)
HEMOGLOBIN: 14.4 g/dL (ref 12.0–15.0)
LYMPHS PCT: 15.7 % (ref 12.0–46.0)
Lymphs Abs: 2.9 10*3/uL (ref 0.7–4.0)
MCHC: 32 g/dL (ref 30.0–36.0)
MCV: 82.1 fl (ref 78.0–100.0)
MONO ABS: 1.2 10*3/uL — AB (ref 0.1–1.0)
Monocytes Relative: 6.6 % (ref 3.0–12.0)
Neutro Abs: 14.1 10*3/uL — ABNORMAL HIGH (ref 1.4–7.7)
Neutrophils Relative %: 76.2 % (ref 43.0–77.0)
Platelets: 252 10*3/uL (ref 150.0–400.0)
RBC: 5.47 Mil/uL — AB (ref 3.87–5.11)
RDW: 18.1 % — ABNORMAL HIGH (ref 11.5–15.5)

## 2015-10-18 LAB — IRON: Iron: 65 ug/dL (ref 42–145)

## 2015-10-18 NOTE — Telephone Encounter (Signed)
Noted, due to steroids 

## 2015-10-18 NOTE — Assessment & Plan Note (Signed)
Will refer to opthalmology

## 2015-10-18 NOTE — Assessment & Plan Note (Signed)
Obtain follow up bmet.  

## 2015-10-18 NOTE — Assessment & Plan Note (Signed)
Resolved

## 2015-10-18 NOTE — Patient Instructions (Addendum)
You will be contacted about your referrals to eye doctor and cardiology.  Complete lab work prior to leaving. Complete stool kit and mail back at your earliest convenience.  
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
## 2015-10-18 NOTE — Progress Notes (Signed)
Pre visit review using our clinic review tool, if applicable. No additional management support is needed unless otherwise documented below in the visit note. 

## 2015-10-18 NOTE — Assessment & Plan Note (Signed)
Advised pt- no driving x 6 months following unexplained syncopal event. Will refer to cardiology for further cardiac work up.

## 2015-10-18 NOTE — Telephone Encounter (Signed)
elam lab reporting critical -- pts WBC @ 18.5
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
## 2015-10-20 ENCOUNTER — Encounter: Payer: Self-pay | Admitting: *Deleted

## 2015-10-20 ENCOUNTER — Emergency Department (HOSPITAL_BASED_OUTPATIENT_CLINIC_OR_DEPARTMENT_OTHER)
Admission: EM | Admit: 2015-10-20 | Discharge: 2015-10-20 | Disposition: A | Discharge status: Home / Self Care | Payer: No Typology Code available for payment source | Attending: Emergency Medicine | Admitting: Emergency Medicine

## 2015-10-20 ENCOUNTER — Encounter (HOSPITAL_BASED_OUTPATIENT_CLINIC_OR_DEPARTMENT_OTHER): Payer: Self-pay

## 2015-10-20 ENCOUNTER — Ambulatory Visit (INDEPENDENT_AMBULATORY_CARE_PROVIDER_SITE_OTHER): Payer: Medicare Other | Admitting: Cardiology

## 2015-10-20 ENCOUNTER — Encounter: Payer: Self-pay | Admitting: Cardiology

## 2015-10-20 VITALS — BP 138/82 | HR 84 | Ht 63.0 in | Wt 217.0 lb

## 2015-10-20 DIAGNOSIS — Z041 Encounter for examination and observation following transport accident: Secondary | ICD-10-CM | POA: Insufficient documentation

## 2015-10-20 DIAGNOSIS — Y92410 Unspecified street and highway as the place of occurrence of the external cause: Secondary | ICD-10-CM | POA: Insufficient documentation

## 2015-10-20 DIAGNOSIS — Z79899 Other long term (current) drug therapy: Secondary | ICD-10-CM | POA: Diagnosis not present

## 2015-10-20 DIAGNOSIS — Z8673 Personal history of transient ischemic attack (TIA), and cerebral infarction without residual deficits: Secondary | ICD-10-CM | POA: Insufficient documentation

## 2015-10-20 DIAGNOSIS — Y9389 Activity, other specified: Secondary | ICD-10-CM | POA: Diagnosis not present

## 2015-10-20 DIAGNOSIS — E785 Hyperlipidemia, unspecified: Secondary | ICD-10-CM

## 2015-10-20 DIAGNOSIS — Z7951 Long term (current) use of inhaled steroids: Secondary | ICD-10-CM | POA: Insufficient documentation

## 2015-10-20 DIAGNOSIS — I10 Essential (primary) hypertension: Secondary | ICD-10-CM | POA: Diagnosis not present

## 2015-10-20 DIAGNOSIS — Z880 Allergy status to penicillin: Secondary | ICD-10-CM | POA: Insufficient documentation

## 2015-10-20 DIAGNOSIS — R590 Localized enlarged lymph nodes: Secondary | ICD-10-CM

## 2015-10-20 DIAGNOSIS — R599 Enlarged lymph nodes, unspecified: Secondary | ICD-10-CM

## 2015-10-20 DIAGNOSIS — Z7902 Long term (current) use of antithrombotics/antiplatelets: Secondary | ICD-10-CM | POA: Insufficient documentation

## 2015-10-20 DIAGNOSIS — Y998 Other external cause status: Secondary | ICD-10-CM | POA: Diagnosis not present

## 2015-10-20 DIAGNOSIS — R55 Syncope and collapse: Secondary | ICD-10-CM | POA: Diagnosis not present

## 2015-10-20 NOTE — Assessment & Plan Note (Signed)
Etiology unclear.Possibly related to laughing. Electrocardiogram was normal. No recurrent symptoms. Plan echocardiogram to assess LV function. If recurrent episodes in the future we'll consider monitor. Patient instructed not to drive for 6 months.
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
## 2015-10-20 NOTE — Assessment & Plan Note (Signed)
Patient will follow up with her primary care physician and pulmonary for this and elevated white blood cell count.

## 2015-10-20 NOTE — Assessment & Plan Note (Signed)
Management per primary care. 
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
## 2015-10-20 NOTE — ED Notes (Signed)
PA at bedside.

## 2015-10-20 NOTE — Assessment & Plan Note (Signed)
Blood pressure controlled. Continue present medications. 

## 2015-10-20 NOTE — Patient Instructions (Signed)
Testing/Procedures:  Your physician has requested that you have an echocardiogram. Echocardiography is a painless test that uses sound waves to create images of your heart. It provides your doctor with information about the size and shape of your heart and how well your heart's chambers and valves are working. This procedure takes approximately one hour. There are no restrictions for this procedure.    Follow-Up:  Your physician wants you to follow-up in: 5 [place] will receive a reminder letter in the mail two months in advance. If you don't receive a letter, please call our office to schedule the follow-up appointment.   If you need a refill on your cardiac medications before your next appointment, please call your pharmacy.
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
## 2015-10-24 ENCOUNTER — Ambulatory Visit: Payer: Medicare Other | Admitting: Family

## 2015-10-27 ENCOUNTER — Other Ambulatory Visit (INDEPENDENT_AMBULATORY_CARE_PROVIDER_SITE_OTHER): Payer: Medicare Other

## 2015-10-27 ENCOUNTER — Encounter: Payer: Self-pay | Admitting: Adult Health

## 2015-10-27 ENCOUNTER — Ambulatory Visit (INDEPENDENT_AMBULATORY_CARE_PROVIDER_SITE_OTHER): Payer: Medicare Other | Admitting: Adult Health

## 2015-10-27 VITALS — BP 136/80 | HR 85 | Temp 98.6°F | Ht 63.0 in | Wt 218.0 lb

## 2015-10-27 DIAGNOSIS — R0602 Shortness of breath: Secondary | ICD-10-CM | POA: Diagnosis not present

## 2015-10-27 DIAGNOSIS — R599 Enlarged lymph nodes, unspecified: Secondary | ICD-10-CM

## 2015-10-27 DIAGNOSIS — R590 Localized enlarged lymph nodes: Secondary | ICD-10-CM

## 2015-10-27 DIAGNOSIS — R918 Other nonspecific abnormal finding of lung field: Secondary | ICD-10-CM | POA: Insufficient documentation

## 2015-10-27 DIAGNOSIS — R911 Solitary pulmonary nodule: Secondary | ICD-10-CM

## 2015-10-27 DIAGNOSIS — J683 Other acute and subacute respiratory conditions due to chemicals, gases, fumes and vapors: Secondary | ICD-10-CM

## 2015-10-27 DIAGNOSIS — J45901 Unspecified asthma with (acute) exacerbation: Secondary | ICD-10-CM

## 2015-10-27 LAB — CBC WITH DIFFERENTIAL/PLATELET
BASOS ABS: 0 10*3/uL (ref 0.0–0.1)
Basophils Relative: 0 % (ref 0.0–3.0)
Eosinophils Absolute: 1.2 10*3/uL — ABNORMAL HIGH (ref 0.0–0.7)
Eosinophils Relative: 10.4 % — ABNORMAL HIGH (ref 0.0–5.0)
HEMATOCRIT: 41.2 % (ref 36.0–46.0)
HEMOGLOBIN: 13.3 g/dL (ref 12.0–15.0)
LYMPHS PCT: 13.6 % (ref 12.0–46.0)
Lymphs Abs: 1.5 10*3/uL (ref 0.7–4.0)
MCHC: 32.4 g/dL (ref 30.0–36.0)
MCV: 81 fl (ref 78.0–100.0)
MONOS PCT: 10.4 % (ref 3.0–12.0)
Monocytes Absolute: 1.2 10*3/uL — ABNORMAL HIGH (ref 0.1–1.0)
Neutro Abs: 7.4 10*3/uL (ref 1.4–7.7)
Neutrophils Relative %: 65.6 % (ref 43.0–77.0)
Platelets: 183 10*3/uL (ref 150.0–400.0)
RBC: 5.08 Mil/uL (ref 3.87–5.11)
RDW: 17.8 % — ABNORMAL HIGH (ref 11.5–15.5)
WBC: 11.2 10*3/uL — ABNORMAL HIGH (ref 4.0–10.5)

## 2015-10-27 MED ORDER — BUDESONIDE-FORMOTEROL FUMARATE 160-4.5 MCG/ACT IN AERO: 2.0000 | INHALATION_SPRAY | Freq: Two times a day (BID) | RESPIRATORY_TRACT | Status: DC

## 2015-10-27 MED ORDER — PREDNISONE 10 MG PO TABS: ORAL_TABLET | ORAL | Status: DC

## 2015-10-27 NOTE — Assessment & Plan Note (Addendum)
Only mildly prominent hilar/mediastinal node ACE level was nml  Repeat CT chest in 6 months ~ May 2017  Repeat cbc today

## 2015-10-27 NOTE — Addendum Note (Signed)
Addended by: Mathis Dad on: 10/27/2015 03:00 PM   Modules accepted: Orders

## 2015-10-27 NOTE — Progress Notes (Signed)
Subjective:    Patient ID: Suzanne Ali, female    DOB: 1951-05-21, 64 y.o.   MRN: [id number]  HPI 64 year old female never smoker with  a past history of hypertension and stroke seen for pulmonary consult 10/05/2015 for acute respiratory failure with reactive airways disease with acute decompensation.  10/27/2015 [facility] follow up  Patient returns for a post hospital follow-up Patient was admitted November 23 through 10/06/2015 for acute exacerbation of reactive airways and acute respiratory failure. Prior to admission. Patient was being seen by her primary care physician for shortness of breath after being exposed to detergent she had a questionable syncopal episode. CT chest was done that was negative for PE. CT showed mediastinal and bilateral hilar adenopathy. A 7 mm left lower lobe pulmonary nodule. She was treated with prednisone and nebulized bronchodilators. He is written recommended to have a CT chest in 6 months.. Had a normal ace level. Since discharge says her breathing returned back to normal  Until 2 days ago.  Family has cold sx .  Complains that she caught a cold 2 days , cough and drainage.  Rash on arms and torso are better.  Labs done on 12/6 shows WBC tr down 24k >18k . Marland Kitchen  Patient denies any chest pain, orthopnea, PND, nausea, vomiting, diarrhea, hemoptysis, bloody stools, nausea, vomiting. She has finished pred taper.  Spirometry today shows severe airflow obstruction with FEV1 495, ratio 58 , FVC 66%.    Past Medical History  Diagnosis Date  . Hypertension   . Stroke ([place])   . Hyperlipidemia    Current Outpatient Prescriptions on File Prior to Visit  Medication Sig Dispense Refill  . acetaminophen (TYLENOL) 325 MG tablet Take 650 mg by mouth every 6 (six) hours as needed for mild pain, moderate pain or headache.    . albuterol (PROVENTIL HFA;VENTOLIN HFA) 108 (90 BASE) MCG/ACT inhaler Inhale 2 puffs into the lungs every 6 (six) hours as needed for  wheezing or shortness of breath. 1 Inhaler 5  . budesonide-formoterol (SYMBICORT) 160-4.5 MCG/ACT inhaler Inhale 2 puffs into the lungs 2 (two) times daily. 1 Inhaler 5  . clopidogrel (PLAVIX) 75 MG tablet TAKE 1 TABLET BY MOUTH ONCE DAILY 30 tablet 5  . hydrochlorothiazide (HYDRODIURIL) 25 MG tablet TAKE 1 TABLET (25 MG TOTAL) BY MOUTH DAILY. 90 tablet 1  . metoprolol succinate (TOPROL-XL) 25 MG 24 hr tablet TAKE 25 MG BY MOUTH DAILY 180 tablet 1  . rosuvastatin (CRESTOR) 40 MG tablet Take 1 tablet (40 mg total) by mouth daily. (Patient taking differently: Take 40 mg by mouth at bedtime. ) 30 tablet 5   No current facility-administered medications on file prior to visit.     Review of Systems Constitutional:   No  weight loss, night sweats,  Fevers, chills, fatigue, or  lassitude.  HEENT:   No headaches,  Difficulty swallowing,  Tooth/dental problems, or  Sore throat,                No sneezing, itching, ear ache, + nasal congestion, post nasal drip,   CV:  No chest pain,  Orthopnea, PND, swelling in lower extremities, anasarca, dizziness, palpitations, syncope.   GI  No heartburn, indigestion, abdominal pain, nausea, vomiting, diarrhea, change in bowel habits, loss of appetite, bloody stools.   Resp:   No chest wall deformity  Skin: no rash or lesions.  GU: no dysuria, change in color of urine, no urgency or frequency.  No flank pain, no hematuria  MS:  No joint pain or swelling.  No decreased range of motion.  No back pain.  Psych:  No change in mood or affect. No depression or anxiety.  No memory loss.         Objective:   Physical Exam GEN: A/Ox3; pleasant , NAD, obese   HEENT:  [place]/AT,  EACs-clear, TMs-wnl, NOSE-clear, THROAT-clear, no lesions, no postnasal drip or exudate noted.   NECK:  Supple w/ fair ROM; no JVD; normal carotid impulses w/o bruits; no thyromegaly or nodules palpated; no lymphadenopathy.  RESP  Few faint exp wheezing , +upper airway psuedowheezes  on forced exp. no accessory muscle use, no dullness to percussion  CARD:  RRR, no m/r/g  , no peripheral edema, pulses intact, no cyanosis or clubbing.  GI:   Soft & nt; nml bowel sounds; no organomegaly or masses detected.  Musco: Warm bil, no deformities or joint swelling noted.   Neuro: alert, no focal deficits noted.    Skin: Warm, hyperpigmentation patches along hands, forearms and torso     CT chest 09/2015  No evidence of pulmonary embolus.  Mildly prominent mediastinal and bilateral hilar lymph nodes, nonspecific. These could be reactive. Cannot exclude inflammatory process such as sarcoidosis.  7 mm left lower lobe pulmonary nodule.      Assessment & Plan:

## 2015-10-27 NOTE — Addendum Note (Signed)
Addended by: Melvenia Needles on: 10/27/2015 02:48 PM   Modules accepted: Orders

## 2015-10-27 NOTE — Patient Instructions (Addendum)
Continue on Symbicort twice daily, rinse well after use. Mucinex DM Twice daily  As needed  Cough/congestion  Prednisone taper over next week.  Labs today  CT chest to be set up for May 2017 .  Follow up Dr. Chase Caller in 2 months with PFT  and As needed   Please contact office for sooner follow up if symptoms do not improve or worsen or seek emergency care

## 2015-10-27 NOTE — Assessment & Plan Note (Signed)
Never smoker with 7 mm LLL nodule  Repeat CT chest in 6 mon

## 2015-10-27 NOTE — Assessment & Plan Note (Addendum)
Flare s/p chemical exposure  ? Slow to resolve vs asthma/RAD flare  Spirometry today shows severe obstruction  xopenex neb x 1    Plan  Continue on Symbicort twice daily, rinse well after use. Mucinex DM Twice daily  As needed  Cough/congestion  Prednisone taper over next week.  Labs today  CT chest to be set up for May 2017 .  Follow up Dr. Chase Caller in 2 months and As needed   Please contact office for sooner follow up if symptoms do not improve or worsen or seek emergency care

## 2015-10-28 ENCOUNTER — Telehealth: Payer: Self-pay | Admitting: Adult Health

## 2015-10-28 ENCOUNTER — Other Ambulatory Visit (INDEPENDENT_AMBULATORY_CARE_PROVIDER_SITE_OTHER): Payer: Medicare Other

## 2015-10-28 DIAGNOSIS — D649 Anemia, unspecified: Secondary | ICD-10-CM

## 2015-10-28 LAB — FECAL OCCULT BLOOD, IMMUNOCHEMICAL: FECAL OCCULT BLD: NEGATIVE

## 2015-10-28 NOTE — Telephone Encounter (Signed)
Results have been explained to patient, pt expressed understanding. Nothing further needed.  

## 2015-10-28 NOTE — Addendum Note (Signed)
Addended by: Mathis Dad on: 10/28/2015 09:11 AM   Modules accepted: Orders

## 2015-10-28 NOTE — Telephone Encounter (Signed)
lmtcb X1 for pt  

## 2015-10-28 NOTE — Telephone Encounter (Signed)
Patient Returned call  [phone]

## 2015-10-28 NOTE — Telephone Encounter (Signed)
Result Note     WBC is improved     Eosinophils are up this time , not elevated before .     Can check IgE on return     Cont w/ ov rec s    Please contact office for sooner follow up if symptoms do not improve or worsen or seek emergency care    ----  lmomtcb x1

## 2015-10-28 NOTE — Telephone Encounter (Signed)
Pt cb please cb at previous number listed °

## 2015-11-01 ENCOUNTER — Telehealth: Payer: Self-pay | Admitting: Family

## 2015-11-01 NOTE — Telephone Encounter (Signed)
Spoke with pt. I was unable to get clear answers as to the type of leave that she is requesting. Pt stated she did not want to return to work and did not feel she was able due to the fumes that she is around that makes her cough. Pt spoke with employer and she states they were requesting letter from PCP allowing her to return to work but pt states she doesn't want to go back.  Scheduled pt appt for 01/04/15 to discuss.

## 2015-11-01 NOTE — Telephone Encounter (Signed)
Pt called asking for excuse out of work from 10/05/15 and forward. She is saying she doesn't think she'll be able to go back. She said that she passed out in the office and has been out of work since. Pt # [phone].

## 2015-11-02 ENCOUNTER — Ambulatory Visit (HOSPITAL_COMMUNITY): Payer: Medicare Other | Attending: Cardiology

## 2015-11-02 ENCOUNTER — Other Ambulatory Visit: Payer: Self-pay

## 2015-11-02 DIAGNOSIS — I10 Essential (primary) hypertension: Secondary | ICD-10-CM | POA: Insufficient documentation

## 2015-11-02 DIAGNOSIS — R55 Syncope and collapse: Secondary | ICD-10-CM | POA: Diagnosis not present

## 2015-11-03 ENCOUNTER — Telehealth: Payer: Self-pay | Admitting: Family

## 2015-11-03 NOTE — Telephone Encounter (Signed)
Caller name: Meklit   Relationship to patient: Self  Can be reached: [phone]   Reason for call: Pt is okay with rescheduling appt due to PCP being out of the office but her concern is that she is still out of work until PCP signs off on disability paperwork. Pt would like to know how soon could she get in to speak with provider. Scheduling isn't showing an available OV appt until February. Please advise further for scheduling.

## 2015-11-04 ENCOUNTER — Ambulatory Visit (INDEPENDENT_AMBULATORY_CARE_PROVIDER_SITE_OTHER): Payer: Medicare Other | Admitting: Family

## 2015-11-04 ENCOUNTER — Encounter: Payer: Self-pay | Admitting: Family

## 2015-11-04 ENCOUNTER — Telehealth: Payer: Self-pay | Admitting: Family

## 2015-11-04 ENCOUNTER — Ambulatory Visit: Payer: Medicare Other | Admitting: Family

## 2015-11-04 VITALS — BP 142/71 | HR 68 | Temp 98.3°F | Resp 18 | Ht 63.0 in | Wt 216.2 lb

## 2015-11-04 DIAGNOSIS — Z8673 Personal history of transient ischemic attack (TIA), and cerebral infarction without residual deficits: Secondary | ICD-10-CM

## 2015-11-04 DIAGNOSIS — D72829 Elevated white blood cell count, unspecified: Secondary | ICD-10-CM

## 2015-11-04 LAB — CBC WITH DIFFERENTIAL/PLATELET
BASOS ABS: 0.1 10*3/uL (ref 0.0–0.1)
Basophils Relative: 0.3 % (ref 0.0–3.0)
EOS ABS: 0.3 10*3/uL (ref 0.0–0.7)
Eosinophils Relative: 1.8 % (ref 0.0–5.0)
HCT: 41.2 % (ref 36.0–46.0)
Hemoglobin: 13.4 g/dL (ref 12.0–15.0)
LYMPHS ABS: 6.2 10*3/uL — AB (ref 0.7–4.0)
LYMPHS PCT: 35.1 % (ref 12.0–46.0)
MCHC: 32.4 g/dL (ref 30.0–36.0)
MCV: 80.8 fl (ref 78.0–100.0)
MONOS PCT: 8.6 % (ref 3.0–12.0)
Monocytes Absolute: 1.5 10*3/uL — ABNORMAL HIGH (ref 0.1–1.0)
NEUTROS ABS: 9.7 10*3/uL — AB (ref 1.4–7.7)
NEUTROS PCT: 54.2 % (ref 43.0–77.0)
PLATELETS: 353 10*3/uL (ref 150.0–400.0)
RBC: 5.09 Mil/uL (ref 3.87–5.11)
RDW: 17.3 % — ABNORMAL HIGH (ref 11.5–15.5)
WBC: 17.8 10*3/uL — ABNORMAL HIGH (ref 4.0–10.5)
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
## 2015-11-04 NOTE — Telephone Encounter (Signed)
Please ask pt to return to the lab early next week for UA , CBC and CXR to evaluate elevated wbc.

## 2015-11-04 NOTE — Telephone Encounter (Signed)
Spoke with pt and will bring her in at 1:15 today to discuss with PCP.

## 2015-11-04 NOTE — Assessment & Plan Note (Signed)
Given pt's hx of CVA and recent syncopal event, I think that it is reasonable for her to remain out of work. She hopes to be able to return to work after she is 6 months free of syncopal events (end of May) and she is able to drive again. I did provide her with a note for her employer. 15 min spent with pt today. >50% of this time was spent counseling the patient on her medical issues.

## 2015-11-04 NOTE — Patient Instructions (Signed)
Please complete lab work prior to leaving.   

## 2015-11-15 ENCOUNTER — Other Ambulatory Visit: Payer: Self-pay | Admitting: Family

## 2015-11-15 MED FILL — METOPROLOL SUCC ER 25 MG TA: 25 | 30 days supply | Qty: 30 | Fill #0

## 2015-11-16 MED FILL — HYDROCHLOROTHIAZIDE 25 MG T: 25 | 90 days supply | Qty: 90 | Fill #0

## 2015-11-16 MED FILL — CLOPIDOGREL 75 MG TABLET: 75 | 30 days supply | Qty: 30 | Fill #0

## 2015-11-16 MED FILL — ROSUVASTATIN CALCIUM 40 MG: 40 | 30 days supply | Qty: 30 | Fill #0

## 2015-11-17 DIAGNOSIS — H40013 Open angle with borderline findings, low risk, bilateral: Secondary | ICD-10-CM | POA: Diagnosis not present

## 2015-11-17 DIAGNOSIS — H524 Presbyopia: Secondary | ICD-10-CM | POA: Diagnosis not present

## 2015-11-17 DIAGNOSIS — H2513 Age-related nuclear cataract, bilateral: Secondary | ICD-10-CM | POA: Diagnosis not present

## 2015-12-01 ENCOUNTER — Telehealth: Payer: Self-pay | Admitting: Family

## 2015-12-01 NOTE — Telephone Encounter (Signed)
Relation to [id number] Call back number:[phone] Pharmacy:  Reason for call:  Patient called stating she would like to speak with Gilmore Laroche regarding supporting notes so she can give to her lawyer regarding workers compensation.

## 2015-12-02 NOTE — Telephone Encounter (Signed)
Pt calling again about below. Adding Ivin Booty and Janett Billow since I'm not sure who would need to f/u

## 2015-12-02 NOTE — Telephone Encounter (Signed)
Notified pt of below.

## 2015-12-02 NOTE — Telephone Encounter (Signed)
All pt needs to do is come by and fill out a medical information release form with what she needs. JG//CMA

## 2015-12-02 NOTE — Telephone Encounter (Signed)
Spoke with pt. She is requesting that we send supporting records for disability approval to her lawyer [facility] [employer]) [phone].  Called and was transferred to [employer]. Left request to send [place] a medical records release specifying what dates of service they will need.

## 2015-12-08 NOTE — Telephone Encounter (Signed)
As per patient medical release form patient would like to know the status

## 2015-12-09 NOTE — Telephone Encounter (Signed)
Pt called back and states she signed a records release for 10/05/15 to present at our office on Monday.  I apologized to pt as I never received this request. I check with medical records and the do not have record of receiving form at this time. Apologized again and pt will come in Monday to sign another release and have form given to me.  Awaiting release.

## 2015-12-09 NOTE — Telephone Encounter (Signed)
Suzanne Ali-- pt has given verbal consent to release health information to her lawyer. I am unsure if she ever signed a release here in our office. When I spoke with pt, she was unclear about what dates of service we should send. I left a message at the lawyer's previously to fax [place] a request and signed release and do not see that I have received either. Can I go ahead and fax records to her lawyer and if so, what dates of service should I send?

## 2015-12-09 NOTE — Telephone Encounter (Signed)
I would recommend that pt contact her lawyer and have them send [place] the paperwork with the information about what they are requesting before we send any paperwork to them.

## 2015-12-09 NOTE — Telephone Encounter (Signed)
Left detailed message on pt's cell# re: below instructions.

## 2015-12-13 NOTE — Telephone Encounter (Signed)
Pt says that she received a call back from [employer] stating that the form  (Medical Release) was found and that

## 2015-12-13 NOTE — Telephone Encounter (Signed)
...  pt is requesting a call back to discuss and confirm form status so that she can have her records released.  Pt says that she would like to pick up her medical records herself from our office, if possible.   Please advise .     CB: [phone].

## 2015-12-13 NOTE — Telephone Encounter (Signed)
Printed office notes from 11/23 and 10/18/15 with PCP. If pt needs ER notes and cardiology notes she will have to contact medical records at [phone]. Notes placed in blue folder on my desk. Pt will also need to sign another records release for these notes when she picks them up. Left message for pt to return my call.

## 2015-12-14 NOTE — Telephone Encounter (Signed)
Notified pt of below and she voices understanding. Placed copy of below office visits from PCP at front desk along with blank medical release form to complete when she picks up records. Also gave pt the # for medical records [phone]).

## 2015-12-15 MED FILL — METOPROLOL SUCC ER 25 MG TA: 25 | 30 days supply | Qty: 30 | Fill #1

## 2015-12-15 MED FILL — CLOPIDOGREL 75 MG TABLET: 75 | 30 days supply | Qty: 30 | Fill #1

## 2015-12-15 MED FILL — ROSUVASTATIN CALCIUM 40 MG: 40 | 30 days supply | Qty: 30 | Fill #1

## 2016-01-12 ENCOUNTER — Ambulatory Visit (INDEPENDENT_AMBULATORY_CARE_PROVIDER_SITE_OTHER): Payer: PPO | Admitting: Internal Medicine

## 2016-01-12 ENCOUNTER — Encounter: Payer: Self-pay | Admitting: Internal Medicine

## 2016-01-12 ENCOUNTER — Encounter (INDEPENDENT_AMBULATORY_CARE_PROVIDER_SITE_OTHER): Payer: Self-pay

## 2016-01-12 VITALS — BP 136/78 | HR 86 | Ht 61.5 in | Wt 222.0 lb

## 2016-01-12 DIAGNOSIS — R590 Localized enlarged lymph nodes: Secondary | ICD-10-CM

## 2016-01-12 DIAGNOSIS — R911 Solitary pulmonary nodule: Secondary | ICD-10-CM | POA: Diagnosis not present

## 2016-01-12 DIAGNOSIS — J683 Other acute and subacute respiratory conditions due to chemicals, gases, fumes and vapors: Secondary | ICD-10-CM

## 2016-01-12 DIAGNOSIS — R0602 Shortness of breath: Secondary | ICD-10-CM

## 2016-01-12 DIAGNOSIS — J4540 Moderate persistent asthma, uncomplicated: Secondary | ICD-10-CM

## 2016-01-12 LAB — PULMONARY FUNCTION TEST
DL/VA % pred: 97 %
DL/VA: 4.36 ml/min/mmHg/L
DLCO COR % PRED: 66 %
DLCO COR: 13.95 ml/min/mmHg
DLCO UNC % PRED: 68 %
DLCO UNC: 14.25 ml/min/mmHg
FEF 25-75 POST: 0.73 L/s
FEF 25-75 PRE: 0.67 L/s
FEF2575-%Change-Post: 9 %
FEF2575-%PRED-PRE: 39 %
FEF2575-%Pred-Post: 43 %
FEV1-%Change-Post: 2 %
FEV1-%PRED-POST: 77 %
FEV1-%Pred-Pre: 75 %
FEV1-POST: 1.34 L
FEV1-Pre: 1.31 L
FEV1FVC-%Change-Post: 8 %
FEV1FVC-%PRED-PRE: 87 %
FEV6-%Change-Post: -5 %
FEV6-%PRED-POST: 84 %
FEV6-%Pred-Pre: 89 %
FEV6-POST: 1.8 L
FEV6-Pre: 1.9 L
FEV6FVC-%CHANGE-POST: 0 %
FEV6FVC-%PRED-POST: 104 %
FEV6FVC-%Pred-Pre: 104 %
FVC-%CHANGE-POST: -5 %
FVC-%Pred-Post: 81 %
FVC-%Pred-Pre: 85 %
FVC-Post: 1.8 L
FVC-Pre: 1.91 L
PRE FEV1/FVC RATIO: 68 %
Post FEV1/FVC ratio: 74 %
Post FEV6/FVC ratio: 100 %
Pre FEV6/FVC Ratio: 100 %
RV % pred: 93 %
RV: 1.86 L
TLC % PRED: 92 %
TLC: 4.35 L
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
## 2016-01-12 NOTE — Patient Instructions (Signed)
ICD-9-CM ICD-10-CM   1. Reactive airways dysfunction syndrome, moderate persistent, uncomplicated 123456 123456   2. Lung nodule 793.11 R91.1     #RADS - You're significantly improved - Monitor your cold - Continue Symbicort as before - Use albuterol as needed  #Lung nodule left lower lobe 7 mm in a nonsmoker November 2016 - Do repeat CT scan of the chest without contrast end of May 2017  #Follow-up - Return to see me after CT chest. We will do ACQ questions and exhaled nitric oxide test at follow-up

## 2016-01-12 NOTE — Progress Notes (Signed)
PFT done today. 

## 2016-01-12 NOTE — Addendum Note (Signed)
Addended by: Collier Salina on: 01/12/2016 02:08 PM   Modules accepted: Orders

## 2016-01-16 ENCOUNTER — Ambulatory Visit (INDEPENDENT_AMBULATORY_CARE_PROVIDER_SITE_OTHER): Payer: PPO | Admitting: Family

## 2016-01-16 ENCOUNTER — Encounter: Payer: Self-pay | Admitting: Family

## 2016-01-16 VITALS — BP 132/98 | HR 86 | Temp 98.1°F | Resp 16 | Ht 63.0 in | Wt 224.2 lb

## 2016-01-16 DIAGNOSIS — I10 Essential (primary) hypertension: Secondary | ICD-10-CM | POA: Diagnosis not present

## 2016-01-16 DIAGNOSIS — L309 Dermatitis, unspecified: Secondary | ICD-10-CM

## 2016-01-16 DIAGNOSIS — J069 Acute upper respiratory infection, unspecified: Secondary | ICD-10-CM

## 2016-01-16 DIAGNOSIS — J45909 Unspecified asthma, uncomplicated: Secondary | ICD-10-CM | POA: Diagnosis not present

## 2016-01-16 DIAGNOSIS — D72829 Elevated white blood cell count, unspecified: Secondary | ICD-10-CM

## 2016-01-16 DIAGNOSIS — Z8673 Personal history of transient ischemic attack (TIA), and cerebral infarction without residual deficits: Secondary | ICD-10-CM

## 2016-01-16 DIAGNOSIS — B9789 Other viral agents as the cause of diseases classified elsewhere: Secondary | ICD-10-CM

## 2016-01-16 LAB — BASIC METABOLIC PANEL
BUN: 26 mg/dL — AB (ref 6–23)
CALCIUM: 9.3 mg/dL (ref 8.4–10.5)
CHLORIDE: 102 meq/L (ref 96–112)
CO2: 27 mEq/L (ref 19–32)
CREATININE: 1.19 mg/dL (ref 0.40–1.20)
GFR: 58.39 mL/min — ABNORMAL LOW (ref >60.00)
Glucose, Bld: 102 mg/dL — ABNORMAL HIGH (ref 70–99)
Potassium: 4 mEq/L (ref 3.5–5.1)
Sodium: 138 mEq/L (ref 135–145)

## 2016-01-16 LAB — CBC WITH DIFFERENTIAL/PLATELET
BASOS ABS: 0.1 10*3/uL (ref 0.0–0.1)
BASOS PCT: 0.7 % (ref 0.0–3.0)
EOS PCT: 14.9 % — AB (ref 0.0–5.0)
Eosinophils Absolute: 1.6 10*3/uL — ABNORMAL HIGH (ref 0.0–0.7)
HEMATOCRIT: 40.1 % (ref 36.0–46.0)
Hemoglobin: 13.3 g/dL (ref 12.0–15.0)
LYMPHS PCT: 26.4 % (ref 12.0–46.0)
Lymphs Abs: 2.8 10*3/uL (ref 0.7–4.0)
MCHC: 33.2 g/dL (ref 30.0–36.0)
MCV: 81.1 fl (ref 78.0–100.0)
MONOS PCT: 6.7 % (ref 3.0–12.0)
Monocytes Absolute: 0.7 10*3/uL (ref 0.1–1.0)
NEUTROS ABS: 5.5 10*3/uL (ref 1.4–7.7)
Neutrophils Relative %: 51.3 % (ref 43.0–77.0)
PLATELETS: 247 10*3/uL (ref 150.0–400.0)
RBC: 4.95 Mil/uL (ref 3.87–5.11)
RDW: 16.5 % — ABNORMAL HIGH (ref 11.5–15.5)
WBC: 10.6 10*3/uL — AB (ref 4.0–10.5)

## 2016-01-16 MED ORDER — HYDROCHLOROTHIAZIDE 25 MG PO TABS: ORAL_TABLET | ORAL | Status: DC

## 2016-01-16 MED ORDER — METOPROLOL SUCCINATE ER 25 MG PO TB24: 25.0000 mg | ORAL_TABLET | Freq: Two times a day (BID) | ORAL | Status: DC

## 2016-01-16 MED ORDER — TRIAMCINOLONE ACETONIDE 0.025 % EX OINT: 1.0000 "application " | TOPICAL_OINTMENT | Freq: Two times a day (BID) | CUTANEOUS | Status: DC

## 2016-01-16 MED FILL — HYDROCHLOROTHIAZIDE 25 MG T: 25 | 90 days supply | Qty: 90 | Fill #0

## 2016-01-16 MED FILL — TRIAMCINOLONE 0.025% OINT: 0.025 | 15 days supply | Qty: 30 | Fill #0

## 2016-01-16 MED FILL — METOPROLOL SUCC ER 25 MG TA: 25 | 90 days supply | Qty: 180 | Fill #0

## 2016-01-16 NOTE — Assessment & Plan Note (Signed)
DBP up a bit. Pt states she felt better on the toprol xl 25mg  bid dosing (now on once daily). Will increase back up to bid.

## 2016-01-16 NOTE — Progress Notes (Signed)
Pre visit review using our clinic review tool, if applicable. No additional management support is needed unless otherwise documented below in the visit note. 

## 2016-01-16 NOTE — Assessment & Plan Note (Signed)
LDL at goal on crestor- continue same, continue plavix.
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
## 2016-01-16 NOTE — Patient Instructions (Signed)
Complete lab work prior to leaving.  You may use triamcinolone ointment twice daily on hands as needed. Increase your toprol xl from once daily to twice daily. Continue symbicort twice daily, continue albuterol every 6 hours. Let me know if you develop fever, if cough worsens, or if cough is not better in 1 week.

## 2016-01-16 NOTE — Assessment & Plan Note (Signed)
Stable, management per pulmonology.  

## 2016-01-17 ENCOUNTER — Other Ambulatory Visit: Payer: Self-pay | Admitting: Family

## 2016-01-26 MED FILL — ROSUVASTATIN CALCIUM 40 MG: 40 | 30 days supply | Qty: 30 | Fill #2

## 2016-01-26 MED FILL — CLOPIDOGREL 75 MG TABLET: 75 | 30 days supply | Qty: 30 | Fill #2

## 2016-03-19 MED FILL — ROSUVASTATIN CALCIUM 40 MG: 40 | 30 days supply | Qty: 30 | Fill #3

## 2016-03-19 MED FILL — CLOPIDOGREL 75 MG TABLET: 75 | 30 days supply | Qty: 30 | Fill #3

## 2016-04-02 ENCOUNTER — Ambulatory Visit (INDEPENDENT_AMBULATORY_CARE_PROVIDER_SITE_OTHER)
Admission: RE | Admit: 2016-04-02 | Discharge: 2016-04-02 | Disposition: A | Discharge status: Home / Self Care | Payer: PPO | Source: Ambulatory Visit | Attending: Internal Medicine | Admitting: Internal Medicine

## 2016-04-02 DIAGNOSIS — R911 Solitary pulmonary nodule: Secondary | ICD-10-CM

## 2016-04-04 ENCOUNTER — Telehealth: Payer: Self-pay | Admitting: Internal Medicine

## 2016-04-04 DIAGNOSIS — I2584 Coronary atherosclerosis due to calcified coronary lesion: Secondary | ICD-10-CM

## 2016-04-04 DIAGNOSIS — I2510 Atherosclerotic heart disease of native coronary artery without angina pectoris: Secondary | ICD-10-CM

## 2016-04-04 DIAGNOSIS — R911 Solitary pulmonary nodule: Secondary | ICD-10-CM

## 2016-04-04 NOTE — Telephone Encounter (Signed)
Called spoke with pt. She states that she had a CT done on 04/02/16 and is requesting the results. I explained to her that MR has not resulted or sent his recs. I explained to her that I would send a message to him and would return her call once we received his answer. She voiced understanding and had no further questions.   MR please advise on CT results.

## 2016-04-05 NOTE — Telephone Encounter (Signed)
Pt is aware of results. Orders have been placed for Cardiology and CT. Nothing further was needed.

## 2016-04-05 NOTE — Telephone Encounter (Signed)
IMPRESSION: 1. Scattered pulmonary nodules measure 5 mm or less in size and are stable from 10/05/2015. Additional followup could be obtained in 1 year to ensure continued stability, as clinically indicated. This recommendation follows the consensus statement: Guidelines for Management of Incidental Pulmonary Nodules Detected on CT Images:From the Fleischner Society 2017; published online before print (10.1148/radiol.SG:5268862). 2. Three-vessel coronary artery calcification. 3. Cholelithiasis.   Electronically Signed  By: Lorin Picket M.D.  On: 04/02/2016 08:43   Signed By: Lorin Picket, MD on 04/02/2016 8:43 AM     Results  1. Lung nodules - stable nov 2016 -> may 2017 (< 77mm). REC: repeat CT chest wo contrast in  1year  2. Has gall stones. She should dw [name], NP   3. Has Co Art calcification - refer cards esp if she has not had a stress test in  Years  Tha ks  Dr. Brand Males, M.D., [facility].C.P Pulmonary and Critical Care Medicine Staff Physician [place] [facility] Pager: [phone], If no answer or between  15:00h - 7:00h: call [phone]  04/05/2016 6:28 AM   g

## 2016-04-06 ENCOUNTER — Encounter: Payer: Self-pay | Admitting: Family

## 2016-04-06 ENCOUNTER — Ambulatory Visit (INDEPENDENT_AMBULATORY_CARE_PROVIDER_SITE_OTHER): Payer: PPO | Admitting: Family

## 2016-04-06 VITALS — BP 144/86 | HR 64 | Temp 98.0°F | Resp 18 | Ht 63.0 in | Wt 221.2 lb

## 2016-04-06 DIAGNOSIS — E2839 Other primary ovarian failure: Secondary | ICD-10-CM

## 2016-04-06 DIAGNOSIS — I10 Essential (primary) hypertension: Secondary | ICD-10-CM

## 2016-04-06 DIAGNOSIS — R739 Hyperglycemia, unspecified: Secondary | ICD-10-CM | POA: Diagnosis not present

## 2016-04-06 DIAGNOSIS — E785 Hyperlipidemia, unspecified: Secondary | ICD-10-CM

## 2016-04-06 DIAGNOSIS — Z1211 Encounter for screening for malignant neoplasm of colon: Secondary | ICD-10-CM | POA: Diagnosis not present

## 2016-04-06 DIAGNOSIS — Z8673 Personal history of transient ischemic attack (TIA), and cerebral infarction without residual deficits: Secondary | ICD-10-CM

## 2016-04-06 LAB — BASIC METABOLIC PANEL
BUN: 27 mg/dL — AB (ref 6–23)
CHLORIDE: 106 meq/L (ref 96–112)
CO2: 24 mEq/L (ref 19–32)
Calcium: 9.6 mg/dL (ref 8.4–10.5)
Creatinine, Ser: 1.2 mg/dL (ref 0.40–1.20)
GFR: 57.79 mL/min — AB (ref >60.00)
GLUCOSE: 106 mg/dL — AB (ref 70–99)
POTASSIUM: 3.9 meq/L (ref 3.5–5.1)
SODIUM: 139 meq/L (ref 135–145)

## 2016-04-06 LAB — HEMOGLOBIN A1C: HEMOGLOBIN A1C: 6.5 % (ref 4.6–6.5)

## 2016-04-06 MED ORDER — CLOPIDOGREL BISULFATE 75 MG PO TABS: 75.0000 mg | ORAL_TABLET | Freq: Every day | ORAL | Status: DC

## 2016-04-06 MED ORDER — ROSUVASTATIN CALCIUM 40 MG PO TABS: 40.0000 mg | ORAL_TABLET | Freq: Every day | ORAL | Status: DC

## 2016-04-06 NOTE — Assessment & Plan Note (Signed)
ldl at goal on statin. Continue same. Plan to obtain flp next visit.

## 2016-04-06 NOTE — Patient Instructions (Addendum)
Please complete lab work prior to leaving. Please contact psychiatry for an appointment to discuss your visual hallucinations:  Psychiatric Services:  [facility], [place] [address], [place], [address], [place], [place] [facility] [phone] [place], [place], [facility], [address], [place], [place]

## 2016-04-06 NOTE — Assessment & Plan Note (Signed)
She is doing well overall. She would like very much to return to work. 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 I told her that I think it is reasonable for her to return to work as long as she is feeling well.
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
## 2016-04-06 NOTE — Assessment & Plan Note (Addendum)
BP stable on current medications. Continue same.  

## 2016-04-06 NOTE — Progress Notes (Signed)
Pre visit review using our clinic review tool, if applicable. No additional management support is needed unless otherwise documented below in the visit note. 

## 2016-04-10 ENCOUNTER — Other Ambulatory Visit (HOSPITAL_BASED_OUTPATIENT_CLINIC_OR_DEPARTMENT_OTHER): Payer: PPO

## 2016-04-13 MED FILL — CLOPIDOGREL 75 MG TABLET: 75 | 30 days supply | Qty: 30 | Fill #4

## 2016-04-13 MED FILL — VENTOLIN HFA 90 MCG INHALER: 108 (90 BAS | 30 days supply | Qty: 18 | Fill #0

## 2016-04-13 MED FILL — ROSUVASTATIN CALCIUM 40 MG: 40 | 30 days supply | Qty: 30 | Fill #4

## 2016-04-16 ENCOUNTER — Encounter: Payer: Self-pay | Admitting: Internal Medicine

## 2016-04-16 ENCOUNTER — Ambulatory Visit (INDEPENDENT_AMBULATORY_CARE_PROVIDER_SITE_OTHER): Payer: PPO | Admitting: Internal Medicine

## 2016-04-16 VITALS — BP 132/82 | HR 62 | Ht 63.0 in | Wt 221.8 lb

## 2016-04-16 DIAGNOSIS — J683 Other acute and subacute respiratory conditions due to chemicals, gases, fumes and vapors: Secondary | ICD-10-CM

## 2016-04-16 DIAGNOSIS — R911 Solitary pulmonary nodule: Secondary | ICD-10-CM | POA: Diagnosis not present

## 2016-04-16 DIAGNOSIS — J4540 Moderate persistent asthma, uncomplicated: Secondary | ICD-10-CM

## 2016-04-16 DIAGNOSIS — I2584 Coronary atherosclerosis due to calcified coronary lesion: Secondary | ICD-10-CM

## 2016-04-16 DIAGNOSIS — I2510 Atherosclerotic heart disease of native coronary artery without angina pectoris: Secondary | ICD-10-CM | POA: Diagnosis not present

## 2016-04-16 DIAGNOSIS — R0602 Shortness of breath: Secondary | ICD-10-CM

## 2016-04-16 NOTE — Patient Instructions (Signed)
ICD-9-CM ICD-10-CM   1. Reactive airways dysfunction syndrome, moderate persistent, uncomplicated 123456 123456   2. SOB (shortness of breath) 786.05 R06.02   3. Coronary artery calcification 414.00 I25.10    414.4 I25.84   4. Lung nodule 793.11 R91.1    #RADS and shortness of breath - overall stable  - stop symbicort  - start BREO (100 dose) 1 puf daily scheduled  -= albuterol as needed - be careful not to over use this - losing weight important - PFT spiro pre and post bd (no dlco , no lung volume) test in 6 months  #lung nodule = ct chest in 1 year  - already ordered   #co artery calcification - referred to cards - appt already present  #Followup Sprirometry pre and post BD (no dlc or lung volume) test in 54months ROV 6 months or sooner if needed

## 2016-04-16 NOTE — Addendum Note (Signed)
Addended by: Wynn Banker H on: 04/16/2016 12:12 PM   Modules accepted: Orders
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
## 2016-04-16 NOTE — Progress Notes (Addendum)
Patient ID: Suzanne Ali, female   DOB: October 25, 1951, 65 y.o.   MRN: [id number] Patient seen in the office today and instructed on use of Breo.  Patient expressed understanding and demonstrated technique. [facility], CMA

## 2016-04-19 ENCOUNTER — Other Ambulatory Visit (HOSPITAL_BASED_OUTPATIENT_CLINIC_OR_DEPARTMENT_OTHER): Payer: PPO

## 2016-04-20 MED FILL — METOPROLOL SUCC ER 25 MG TA: 25 | 90 days supply | Qty: 180 | Fill #1

## 2016-04-20 MED FILL — HYDROCHLOROTHIAZIDE 25 MG T: 25 | 90 days supply | Qty: 90 | Fill #1

## 2016-04-24 ENCOUNTER — Ambulatory Visit (HOSPITAL_BASED_OUTPATIENT_CLINIC_OR_DEPARTMENT_OTHER)
Admission: RE | Admit: 2016-04-24 | Discharge: 2016-04-24 | Disposition: A | Discharge status: Home / Self Care | Payer: PPO | Source: Ambulatory Visit | Attending: Family | Admitting: Family

## 2016-04-24 DIAGNOSIS — E2839 Other primary ovarian failure: Secondary | ICD-10-CM | POA: Insufficient documentation

## 2016-04-24 DIAGNOSIS — Z780 Asymptomatic menopausal state: Secondary | ICD-10-CM | POA: Diagnosis not present

## 2016-04-24 DIAGNOSIS — Z13820 Encounter for screening for osteoporosis: Secondary | ICD-10-CM | POA: Diagnosis not present

## 2016-04-26 ENCOUNTER — Telehealth: Payer: Self-pay | Admitting: Internal Medicine

## 2016-04-26 MED ORDER — FLUTICASONE FUROATE-VILANTEROL 100-25 MCG/INH IN AEPB: 1.0000 | INHALATION_SPRAY | Freq: Every day | RESPIRATORY_TRACT | Status: DC

## 2016-04-26 MED FILL — BREO ELLIPTA 100-25 MCG INH: 100-25 | 30 days supply | Qty: 60 | Fill #0

## 2016-04-26 NOTE — Telephone Encounter (Signed)
Spoke with pt, requesting refill on breo sent to [facility] pharmacy.  This has been sent.  Nothing further needed.

## 2016-05-04 ENCOUNTER — Encounter: Payer: Self-pay | Admitting: Family
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
## 2016-05-23 ENCOUNTER — Ambulatory Visit (INDEPENDENT_AMBULATORY_CARE_PROVIDER_SITE_OTHER): Payer: PPO | Admitting: Cardiology

## 2016-05-23 ENCOUNTER — Other Ambulatory Visit: Payer: Self-pay | Admitting: Family

## 2016-05-23 ENCOUNTER — Encounter: Payer: Self-pay | Admitting: Cardiology

## 2016-05-23 VITALS — BP 124/78 | HR 81 | Ht 63.0 in | Wt 222.0 lb

## 2016-05-23 DIAGNOSIS — I10 Essential (primary) hypertension: Secondary | ICD-10-CM

## 2016-05-23 DIAGNOSIS — E785 Hyperlipidemia, unspecified: Secondary | ICD-10-CM

## 2016-05-23 DIAGNOSIS — R55 Syncope and collapse: Secondary | ICD-10-CM

## 2016-05-23 MED FILL — ROSUVASTATIN CALCIUM 40 MG: 40 | 30 days supply | Qty: 30 | Fill #5

## 2016-05-23 MED FILL — CLOPIDOGREL 75 MG TABLET: 75 | 30 days supply | Qty: 30 | Fill #5

## 2016-05-23 NOTE — Patient Instructions (Signed)
Your physician recommends that you schedule a follow-up appointment as needed with Dr. Stanford Breed.

## 2016-05-23 NOTE — Telephone Encounter (Signed)
Requesting Triamcinolone 0.025% ointment-Apply 1 application topically 2 times daily. Last refill:01/16/16;#30g,0 Last OV:04/06/16 Please advise.//AB/CMA

## 2016-05-24 MED FILL — BREO ELLIPTA 100-25 MCG INH: 100-25 | 30 days supply | Qty: 60 | Fill #1

## 2016-05-24 MED FILL — TRIAMCINOLONE 0.025% OINT: 0.025 | 15 days supply | Qty: 30 | Fill #0

## 2016-06-27 MED FILL — BREO ELLIPTA 100-25 MCG INH: 100-25 | 30 days supply | Qty: 60 | Fill #2

## 2016-06-27 MED FILL — CLOPIDOGREL 75 MG TABLET: 75 | 30 days supply | Qty: 30 | Fill #0

## 2016-06-27 MED FILL — ROSUVASTATIN CALCIUM 40 MG: 40 | 30 days supply | Qty: 30 | Fill #0

## 2016-07-02 ENCOUNTER — Other Ambulatory Visit: Payer: Self-pay | Admitting: Family

## 2016-07-02 MED FILL — TRIAMCINOLONE 0.025% OINT: 0.025 | 15 days supply | Qty: 30 | Fill #0

## 2016-07-20 ENCOUNTER — Other Ambulatory Visit: Payer: Self-pay | Admitting: Family

## 2016-07-20 MED FILL — METOPROLOL SUCC ER 25 MG TA: 25 | 90 days supply | Qty: 180 | Fill #0

## 2016-07-20 MED FILL — HYDROCHLOROTHIAZIDE 25 MG T: 25 | 90 days supply | Qty: 90 | Fill #0

## 2016-07-23 ENCOUNTER — Ambulatory Visit (INDEPENDENT_AMBULATORY_CARE_PROVIDER_SITE_OTHER): Payer: PPO | Admitting: Family

## 2016-07-23 ENCOUNTER — Encounter: Payer: Self-pay | Admitting: Family

## 2016-07-23 VITALS — BP 137/66 | HR 69 | Temp 98.1°F | Resp 18 | Ht 63.0 in | Wt 228.4 lb

## 2016-07-23 DIAGNOSIS — E118 Type 2 diabetes mellitus with unspecified complications: Secondary | ICD-10-CM | POA: Diagnosis not present

## 2016-07-23 DIAGNOSIS — E119 Type 2 diabetes mellitus without complications: Secondary | ICD-10-CM

## 2016-07-23 DIAGNOSIS — I10 Essential (primary) hypertension: Secondary | ICD-10-CM | POA: Diagnosis not present

## 2016-07-23 DIAGNOSIS — Z8673 Personal history of transient ischemic attack (TIA), and cerebral infarction without residual deficits: Secondary | ICD-10-CM

## 2016-07-23 DIAGNOSIS — E785 Hyperlipidemia, unspecified: Secondary | ICD-10-CM | POA: Diagnosis not present

## 2016-07-23 DIAGNOSIS — Z23 Encounter for immunization: Secondary | ICD-10-CM

## 2016-07-23 DIAGNOSIS — I639 Cerebral infarction, unspecified: Secondary | ICD-10-CM | POA: Diagnosis not present

## 2016-07-23 DIAGNOSIS — Z0000 Encounter for general adult medical examination without abnormal findings: Secondary | ICD-10-CM

## 2016-07-23 HISTORY — DX: Type 2 diabetes mellitus without complications: E11.9

## 2016-07-23 LAB — BASIC METABOLIC PANEL
BUN: 29 mg/dL — ABNORMAL HIGH (ref 6–23)
CALCIUM: 9.1 mg/dL (ref 8.4–10.5)
CO2: 24 mEq/L (ref 19–32)
Chloride: 105 mEq/L (ref 96–112)
Creatinine, Ser: 1.14 mg/dL (ref 0.40–1.20)
GFR: 61.26 mL/min (ref >60.00)
Glucose, Bld: 104 mg/dL — ABNORMAL HIGH (ref 70–99)
Potassium: 4 mEq/L (ref 3.5–5.1)
SODIUM: 138 meq/L (ref 135–145)

## 2016-07-23 LAB — LIPID PANEL
Cholesterol: 141 mg/dL (ref 0–200)
HDL: 44.5 mg/dL (ref >39.00)
LDL CALC: 83 mg/dL (ref 0–99)
NONHDL: 96.64
Total CHOL/HDL Ratio: 3
Triglycerides: 69 mg/dL (ref 0.0–149.0)
VLDL: 13.8 mg/dL (ref 0.0–40.0)

## 2016-07-23 NOTE — Assessment & Plan Note (Signed)
Obtain follow up A1C, urine microalbumin. Is + microalbuminuria, consider addition of lisinopril.

## 2016-07-23 NOTE — Progress Notes (Signed)
Pre visit review using our clinic review tool, if applicable. No additional management support is needed unless otherwise documented below in the visit note. 

## 2016-07-23 NOTE — Assessment & Plan Note (Signed)
She tells me that she was not on aspirin at the time of her initial stroke back in 2013. She has been maintained on plavix.  I would like for her to see neurology for consultation and to help sort out antiplatelet therapy needs.  Will arrange for a neurology visit near her home.
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
## 2016-07-23 NOTE — Patient Instructions (Addendum)
Please complete lab work prior to leaving. You will be contacted about your referral for colonoscopy. You will be contacted about your referral to neurology at [facility].

## 2016-07-23 NOTE — Assessment & Plan Note (Signed)
On statin. Obtain follow up lipid panel.

## 2016-07-23 NOTE — Assessment & Plan Note (Signed)
Stable on current medications. Continue same. 

## 2016-07-24 ENCOUNTER — Encounter: Payer: Self-pay | Admitting: Family

## 2016-07-25 MED FILL — BREO ELLIPTA 100-25 MCG INH: 100-25 | 30 days supply | Qty: 60 | Fill #3

## 2016-07-25 MED FILL — CLOPIDOGREL 75 MG TABLET: 75 | 30 days supply | Qty: 30 | Fill #1

## 2016-07-25 MED FILL — ROSUVASTATIN CALCIUM 40 MG: 40 | 30 days supply | Qty: 30 | Fill #1

## 2016-08-29 MED FILL — CLOPIDOGREL 75 MG TABLET: 75 | 30 days supply | Qty: 30 | Fill #2

## 2016-08-29 MED FILL — ROSUVASTATIN CALCIUM 40 MG: 40 | 30 days supply | Qty: 30 | Fill #2

## 2016-08-29 MED FILL — BREO ELLIPTA 100-25 MCG INH: 100-25 | 30 days supply | Qty: 60 | Fill #4

## 2016-10-01 ENCOUNTER — Other Ambulatory Visit: Payer: Self-pay | Admitting: Family

## 2016-10-01 MED FILL — ROSUVASTATIN CALCIUM 40 MG: 40 | 30 days supply | Qty: 30 | Fill #3

## 2016-10-01 MED FILL — BREO ELLIPTA 100-25 MCG INH: 100-25 | 30 days supply | Qty: 60 | Fill #5

## 2016-10-01 MED FILL — TRIAMCINOLONE 0.025% OINT: 0.025 | 15 days supply | Qty: 30 | Fill #0

## 2016-10-01 MED FILL — CLOPIDOGREL 75 MG TABLET: 75 | 30 days supply | Qty: 30 | Fill #3

## 2016-10-22 ENCOUNTER — Telehealth: Payer: Self-pay | Admitting: Family

## 2016-10-22 ENCOUNTER — Ambulatory Visit: Payer: PPO | Admitting: Family

## 2016-10-22 MED FILL — HYDROCHLOROTHIAZIDE 25 MG T: 25 | 90 days supply | Qty: 90 | Fill #1

## 2016-10-22 MED FILL — METOPROLOL SUCC ER 25 MG TA: 25 | 90 days supply | Qty: 180 | Fill #1

## 2016-10-22 NOTE — Telephone Encounter (Signed)
°  Relation to [id number] Call back [phone] Pharmacy:[facility]  Reason for call: pt states she is completely out of her bp meds, pt had appt today however had to reschedule because she arrived late, pt is scheduled for 10/24/16 but would like to know if she can get her meds sent downstairs until she can get in to see Melissa. Needing rx  hydrochlorothiazide (HYDRODIURIL) 25 MG tablet  And metoprolol succinate (TOPROL-XL) 25 MG 24 hr tablet

## 2016-10-22 NOTE — Telephone Encounter (Signed)
Per chart review, pt should still have 1 refill (90 days) remaining on each of those prescriptions. Spoke with Pam in the pharmacy and verified that refill is remaining on file and to fill from previous rx.

## 2016-10-24 ENCOUNTER — Encounter: Payer: Self-pay | Admitting: Family

## 2016-10-24 ENCOUNTER — Ambulatory Visit (INDEPENDENT_AMBULATORY_CARE_PROVIDER_SITE_OTHER): Payer: PPO | Admitting: Family

## 2016-10-24 VITALS — BP 122/66 | HR 64 | Temp 98.6°F | Resp 18 | Ht 63.0 in | Wt 229.6 lb

## 2016-10-24 DIAGNOSIS — E785 Hyperlipidemia, unspecified: Secondary | ICD-10-CM | POA: Diagnosis not present

## 2016-10-24 DIAGNOSIS — Z8673 Personal history of transient ischemic attack (TIA), and cerebral infarction without residual deficits: Secondary | ICD-10-CM

## 2016-10-24 DIAGNOSIS — E119 Type 2 diabetes mellitus without complications: Secondary | ICD-10-CM

## 2016-10-24 DIAGNOSIS — E1149 Type 2 diabetes mellitus with other diabetic neurological complication: Secondary | ICD-10-CM

## 2016-10-24 DIAGNOSIS — I10 Essential (primary) hypertension: Secondary | ICD-10-CM

## 2016-10-24 LAB — BASIC METABOLIC PANEL WITH GFR
BUN: 22 mg/dL (ref 6–23)
CO2: 26 meq/L (ref 19–32)
Calcium: 9.5 mg/dL (ref 8.4–10.5)
Chloride: 106 meq/L (ref 96–112)
Creatinine, Ser: 1.25 mg/dL — ABNORMAL HIGH (ref 0.40–1.20)
GFR: 55.04 mL/min — ABNORMAL LOW (ref >60.00)
Glucose, Bld: 96 mg/dL (ref 70–99)
Potassium: 4.2 meq/L (ref 3.5–5.1)
Sodium: 141 meq/L (ref 135–145)

## 2016-10-24 LAB — MICROALBUMIN / CREATININE URINE RATIO
CREATININE, U: 345.7 mg/dL
MICROALB UR: 4.5 mg/dL — AB (ref 0.0–1.9)
MICROALB/CREAT RATIO: 1.3 mg/g (ref 0.0–30.0)

## 2016-10-24 LAB — HEMOGLOBIN A1C: HEMOGLOBIN A1C: 6.4 % (ref 4.6–6.5)

## 2016-10-24 NOTE — Progress Notes (Signed)
Pre visit review using our clinic review tool, if applicable. No additional management support is needed unless otherwise documented below in the visit note. 

## 2016-10-24 NOTE — Assessment & Plan Note (Signed)
BP is stable on current medications, continue same, obtain follow up bmet.

## 2016-10-24 NOTE — Patient Instructions (Addendum)
Please call Dr Lucius Conn office at [phone] to schedule your follow up. Complete lab work prior to leaving.

## 2016-10-24 NOTE — Assessment & Plan Note (Signed)
Continue plavix, BP and lipid control.

## 2016-10-24 NOTE — Assessment & Plan Note (Signed)
Obtain follow up A1C, urine microalbumin, refer for DM eye exam.

## 2016-10-24 NOTE — Assessment & Plan Note (Signed)
LDL at goal.  Continue statin.   
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
## 2016-10-25 ENCOUNTER — Other Ambulatory Visit: Payer: Self-pay | Admitting: Family

## 2016-10-25 ENCOUNTER — Encounter: Payer: Self-pay | Admitting: Family

## 2016-10-25 DIAGNOSIS — M79602 Pain in left arm: Secondary | ICD-10-CM | POA: Diagnosis not present

## 2016-10-25 DIAGNOSIS — Z7902 Long term (current) use of antithrombotics/antiplatelets: Secondary | ICD-10-CM | POA: Diagnosis not present

## 2016-10-25 DIAGNOSIS — S0990XA Unspecified injury of head, initial encounter: Secondary | ICD-10-CM | POA: Diagnosis not present

## 2016-10-25 DIAGNOSIS — E119 Type 2 diabetes mellitus without complications: Secondary | ICD-10-CM | POA: Diagnosis not present

## 2016-10-25 DIAGNOSIS — I10 Essential (primary) hypertension: Secondary | ICD-10-CM | POA: Diagnosis not present

## 2016-10-25 DIAGNOSIS — S4992XA Unspecified injury of left shoulder and upper arm, initial encounter: Secondary | ICD-10-CM | POA: Diagnosis not present

## 2016-10-25 DIAGNOSIS — R079 Chest pain, unspecified: Secondary | ICD-10-CM | POA: Diagnosis not present

## 2016-10-25 DIAGNOSIS — M25512 Pain in left shoulder: Secondary | ICD-10-CM | POA: Diagnosis not present

## 2016-10-25 DIAGNOSIS — Z9989 Dependence on other enabling machines and devices: Secondary | ICD-10-CM | POA: Diagnosis not present

## 2016-10-25 DIAGNOSIS — M19012 Primary osteoarthritis, left shoulder: Secondary | ICD-10-CM | POA: Diagnosis not present

## 2016-10-25 MED ORDER — LISINOPRIL 2.5 MG PO TABS: 2.5000 mg | ORAL_TABLET | Freq: Every day | ORAL | 3 refills | Status: DC

## 2016-10-25 NOTE — Telephone Encounter (Signed)
Sugar is controlled.  Microscopic protein in urine. Add lisinopril 2.5mg .  Follow up in 1 month for nurse visit BP check and bmet.

## 2016-10-30 ENCOUNTER — Other Ambulatory Visit: Payer: Self-pay | Admitting: Internal Medicine

## 2016-11-07 MED FILL — LISINOPRIL 2.5 MG TABLET: 2.5 | 30 days supply | Qty: 30 | Fill #0

## 2016-11-13 ENCOUNTER — Other Ambulatory Visit: Payer: Self-pay | Admitting: Family

## 2016-11-13 MED FILL — BREO ELLIPTA 100-25 MCG INH: 100-25 | 30 days supply | Qty: 60 | Fill #0

## 2016-11-13 NOTE — Telephone Encounter (Signed)
Medication Detail   Disp Refills Start End   metoprolol succinate (TOPROL-XL) 25 MG 24 hr tablet 180 tablet 1 07/20/2016    Sig: TAKE 1 TABLET (25 MG TOTAL) BY MOUTH 2 (TWO) TIMES DAILY.   E-Prescribing Status: Receipt confirmed by pharmacy (07/20/2016 3:59 PM EDT)   [place], [place] [facility] [place]

## 2016-11-15 MED FILL — CLOPIDOGREL 75 MG TABLET: 75 | 30 days supply | Qty: 30 | Fill #4

## 2016-11-15 MED FILL — ROSUVASTATIN CALCIUM 40 MG: 40 | 30 days supply | Qty: 30 | Fill #4

## 2016-12-10 DIAGNOSIS — H52223 Regular astigmatism, bilateral: Secondary | ICD-10-CM | POA: Diagnosis not present

## 2016-12-10 DIAGNOSIS — H524 Presbyopia: Secondary | ICD-10-CM | POA: Diagnosis not present

## 2016-12-10 DIAGNOSIS — H5203 Hypermetropia, bilateral: Secondary | ICD-10-CM | POA: Diagnosis not present

## 2016-12-20 MED FILL — BREO ELLIPTA 100-25 MCG INH: 100-25 | 30 days supply | Qty: 60 | Fill #1

## 2016-12-20 MED FILL — CLOPIDOGREL 75 MG TABLET: 75 | 30 days supply | Qty: 30 | Fill #5

## 2016-12-20 MED FILL — ROSUVASTATIN CALCIUM 40 MG: 40 | 30 days supply | Qty: 30 | Fill #5

## 2016-12-20 MED FILL — LISINOPRIL 2.5 MG TABLET: 2.5 | 30 days supply | Qty: 30 | Fill #1

## 2017-01-14 ENCOUNTER — Ambulatory Visit: Payer: PPO | Admitting: Pulmonary Disease

## 2017-01-15 ENCOUNTER — Ambulatory Visit: Payer: PPO | Admitting: Adult Health

## 2017-01-23 ENCOUNTER — Encounter: Payer: Self-pay | Admitting: Family

## 2017-01-23 ENCOUNTER — Ambulatory Visit (INDEPENDENT_AMBULATORY_CARE_PROVIDER_SITE_OTHER): Payer: PPO | Admitting: Family

## 2017-01-23 ENCOUNTER — Ambulatory Visit (INDEPENDENT_AMBULATORY_CARE_PROVIDER_SITE_OTHER): Payer: PPO | Admitting: Adult Health

## 2017-01-23 ENCOUNTER — Encounter: Payer: Self-pay | Admitting: Adult Health

## 2017-01-23 VITALS — BP 130/80 | HR 95 | Ht 63.0 in | Wt 224.0 lb

## 2017-01-23 VITALS — BP 144/88 | HR 93 | Temp 98.1°F | Resp 16 | Ht 63.0 in | Wt 224.0 lb

## 2017-01-23 DIAGNOSIS — J4530 Mild persistent asthma, uncomplicated: Secondary | ICD-10-CM

## 2017-01-23 DIAGNOSIS — Z23 Encounter for immunization: Secondary | ICD-10-CM | POA: Diagnosis not present

## 2017-01-23 DIAGNOSIS — L2389 Allergic contact dermatitis due to other agents: Secondary | ICD-10-CM

## 2017-01-23 DIAGNOSIS — R911 Solitary pulmonary nodule: Secondary | ICD-10-CM

## 2017-01-23 MED ORDER — HYDROCHLOROTHIAZIDE 25 MG PO TABS: ORAL_TABLET | ORAL | 1 refills | Status: DC

## 2017-01-23 MED ORDER — LISINOPRIL 2.5 MG PO TABS: 2.5000 mg | ORAL_TABLET | Freq: Every day | ORAL | 1 refills | Status: DC

## 2017-01-23 MED ORDER — BETAMETHASONE DIPROPIONATE 0.05 % EX CREA: TOPICAL_CREAM | Freq: Two times a day (BID) | CUTANEOUS | 0 refills | Status: DC

## 2017-01-23 MED FILL — HYDROCHLOROTHIAZIDE 25 MG T: 25 | 90 days supply | Qty: 90 | Fill #0

## 2017-01-23 MED FILL — LISINOPRIL 2.5 MG TABLET: 2.5 | 90 days supply | Qty: 90 | Fill #0

## 2017-01-23 MED FILL — BETAMETHASONE DP 0.05% CRM: 0.05 | 30 days supply | Qty: 45 | Fill #0

## 2017-01-23 NOTE — Progress Notes (Signed)
Pre visit review using our clinic review tool, if applicable. No additional management support is needed unless otherwise documented below in the visit note. 
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
## 2017-01-23 NOTE — Addendum Note (Signed)
Addended by: Kelle Darting A on: 01/23/2017 09:05 AM   Modules accepted: Orders

## 2017-01-23 NOTE — Patient Instructions (Signed)
Continue on BREO daily . Rinse after use.  Follow up for CT chest as planned in June.  follow up Dr. Chase Caller in 4 months and As needed

## 2017-01-23 NOTE — Progress Notes (Signed)
@Patient  ID: Suzanne Ali, female    DOB: 08/10/1951, 66 y.o.   MRN: [id number]  Chief Complaint  Patient presents with  . Follow-up    Asthma     Referring provider: [name], NP  HPI: 66 yo female never smoker followed for RAD/Asthma   TEST  CT chest >Never smoker with 7 mm LLL nodule /09/2015 CT chest >03/2016 stable nodules 5 mm [name]   01/24/2017 Follow up ; Asthma  Pt presents for 6 months follow up . Says she is doing well on BREO . No flare of cough or wheezing . Says overall she is doing well . No increased SABA use or nocutrnal sx.   On ACE inhiibtor but denies cough .   Incidental lung nodules seen on CT with stability noted in seral CT chest . Has upcoming CT set up for June 2018.    Allergies  Allergen Reactions  . Benicar [Olmesartan]     hair loss, dry skin.  . Latex     Swelling/peeling skin  . Penicillins Hives  . Sulfa Antibiotics Hives    Immunization History  Administered Date(s) Administered  . Influenza, High Dose Seasonal PF 07/23/2016  . Influenza,inj,Quad PF,36+ Mos 10/23/2013, 09/20/2014, 07/25/2015  . Pneumococcal Conjugate-13 07/25/2015  . Pneumococcal Polysaccharide-23 01/23/2017  . Tdap 05/03/2012    Past Medical History:  Diagnosis Date  . Diabetes mellitus type 2, controlled, without complications ([place]) 1/88/4166  . Hyperlipidemia   . Hypertension   . Stroke [facility])     Tobacco History: History  Smoking Status  . Never Smoker  Smokeless Tobacco  . Never Used   Counseling given: Not Answered   Outpatient Encounter Prescriptions as of 01/23/2017  Medication Sig  . acetaminophen (TYLENOL) 325 MG tablet Take 650 mg by mouth every 6 (six) hours as needed for mild pain, moderate pain or headache.  . albuterol (PROAIR HFA) 108 (90 Base) MCG/ACT inhaler Inhale 2 puffs into the lungs every 6 (six) hours as needed for wheezing or shortness of breath.  . betamethasone dipropionate (DIPROLENE) 0.05 % cream Apply topically  2 (two) times daily.  Marland Kitchen BREO ELLIPTA 100-25 MCG/INH AEPB INHALE 1 PUFF BY MOUTH INTO THE LUNGS DAILY.  Marland Kitchen clopidogrel (PLAVIX) 75 MG tablet Take 1 tablet (75 mg total) by mouth daily.  . hydrochlorothiazide (HYDRODIURIL) 25 MG tablet TAKE 1 TABLET (25 MG TOTAL) BY MOUTH DAILY.  Marland Kitchen lisinopril (PRINIVIL,ZESTRIL) 2.5 MG tablet Take 1 tablet (2.5 mg total) by mouth daily.  . metoprolol succinate (TOPROL-XL) 25 MG 24 hr tablet TAKE 1 TABLET (25 MG TOTAL) BY MOUTH 2 (TWO) TIMES DAILY.  . rosuvastatin (CRESTOR) 40 MG tablet Take 1 tablet (40 mg total) by mouth daily.  Marland Kitchen triamcinolone (KENALOG) 0.025 % ointment APPLY 1 APPLICATION TOPICALLY 2 TIMES DAILY.   No facility-administered encounter medications on file as of 01/23/2017.      Review of Systems  Constitutional:   No  weight loss, night sweats,  Fevers, chills, fatigue, or  lassitude.  HEENT:   No headaches,  Difficulty swallowing,  Tooth/dental problems, or  Sore throat,                No sneezing, itching, ear ache,  +nasal congestion, post nasal drip,   CV:  No chest pain,  Orthopnea, PND, swelling in lower extremities, anasarca, dizziness, palpitations, syncope.   GI  No heartburn, indigestion, abdominal pain, nausea, vomiting, diarrhea, change in bowel habits, loss of appetite, bloody stools.   Resp:  No shortness of breath with exertion or at rest.  No excess mucus, no productive cough,  No non-productive cough,  No coughing up of blood.  No change in color of mucus.  No wheezing.  No chest wall deformity  Skin: no rash or lesions.  GU: no dysuria, change in color of urine, no urgency or frequency.  No flank pain, no hematuria   MS:  No joint pain or swelling.  No decreased range of motion.  No back pain.    Physical Exam  BP 130/80   Pulse 95   Ht 5\' 3"  (1.6 m)   Wt 224 lb (101.6 kg)   SpO2 99%   BMI 39.68 kg/m   GEN: A/Ox3; pleasant , NAD,obese    HEENT:  [place]/AT,  EACs-clear, TMs-wnl, NOSE-clear, THROAT-clear, no  lesions, no postnasal drip or exudate noted.   NECK:  Supple w/ fair ROM; no JVD; normal carotid impulses w/o bruits; no thyromegaly or nodules palpated; no lymphadenopathy.    RESP  Clear  P & A; w/o, wheezes/ rales/ or rhonchi. no accessory muscle use, no dullness to percussion  CARD:  RRR, no m/r/g, no peripheral edema, pulses intact, no cyanosis or clubbing.  GI:   Soft & nt; nml bowel sounds; no organomegaly or masses detected.   Musco: Warm bil, no deformities or joint swelling noted.   Neuro: alert, no focal deficits noted.    Skin: Warm, no lesions or rashes     Lab Results:   BNP No results found for: BNP  ProBNP Imaging: No results found.   Assessment & Plan:   Lung nodule Follow up CT chest in 04/2017 .   Reactive airways /Asthma  RAD/Asthma doing well with no active flare   Plan  Patient Instructions  Continue on BREO daily . Rinse after use.  Follow up for CT chest as planned in June.  follow up Dr. Chase Caller in 4 months and As needed          Rexene Edison, NP 01/24/2017

## 2017-01-23 NOTE — Patient Instructions (Addendum)
Apply cream twice daily to your hands for next 1-2 weeks.  Apply vaseline twice daily to hands. Call if increased redness/pain swelling or if not improved in 1 week.  Purchase latex free gloves to use at work.

## 2017-01-24 NOTE — Assessment & Plan Note (Signed)
RAD/Asthma doing well with no active flare   Plan  Patient Instructions  Continue on BREO daily . Rinse after use.  Follow up for CT chest as planned in June.  follow up Dr. Chase Caller in 4 months and As needed

## 2017-01-24 NOTE — Assessment & Plan Note (Signed)
Follow up CT chest in 04/2017 .

## 2017-01-30 ENCOUNTER — Telehealth: Payer: Self-pay | Admitting: Family

## 2017-01-30 NOTE — Telephone Encounter (Signed)
Left pt message asking to call Allison back directly at [phone] to schedule AWV. Thanks! °

## 2017-02-16 DIAGNOSIS — L309 Dermatitis, unspecified: Secondary | ICD-10-CM | POA: Diagnosis not present

## 2017-02-18 ENCOUNTER — Other Ambulatory Visit: Payer: Self-pay | Admitting: Family

## 2017-02-18 MED FILL — BREO ELLIPTA 100-25 MCG INH: 100-25 | 30 days supply | Qty: 60 | Fill #2

## 2017-02-19 MED FILL — ROSUVASTATIN CALCIUM 40 MG: 40 | 30 days supply | Qty: 30 | Fill #0

## 2017-02-19 MED FILL — CLOPIDOGREL 75 MG TABLET: 75 | 30 days supply | Qty: 30 | Fill #0

## 2017-02-19 NOTE — Telephone Encounter (Signed)
eScribe request from [facility] for refill on Plavix & Crestor Last filled - 04/06/16 Last AEX - 01/24/16 [Acute], 10/24/16 [F/U] Next AEX - 4-Mths, upcoming appt scheduled for 02/25/17 Refill sent per [facility] refill protocol/SLS

## 2017-02-25 ENCOUNTER — Ambulatory Visit (INDEPENDENT_AMBULATORY_CARE_PROVIDER_SITE_OTHER): Payer: PPO | Admitting: Family

## 2017-02-25 ENCOUNTER — Encounter: Payer: Self-pay | Admitting: Family

## 2017-02-25 ENCOUNTER — Encounter: Payer: Self-pay | Admitting: Physician Assistant

## 2017-02-25 VITALS — BP 121/75 | HR 82 | Temp 98.1°F | Resp 20 | Ht 63.0 in | Wt 221.2 lb

## 2017-02-25 DIAGNOSIS — E785 Hyperlipidemia, unspecified: Secondary | ICD-10-CM

## 2017-02-25 DIAGNOSIS — Z794 Long term (current) use of insulin: Secondary | ICD-10-CM

## 2017-02-25 DIAGNOSIS — L309 Dermatitis, unspecified: Secondary | ICD-10-CM

## 2017-02-25 DIAGNOSIS — E1129 Type 2 diabetes mellitus with other diabetic kidney complication: Secondary | ICD-10-CM

## 2017-02-25 DIAGNOSIS — Z8673 Personal history of transient ischemic attack (TIA), and cerebral infarction without residual deficits: Secondary | ICD-10-CM | POA: Diagnosis not present

## 2017-02-25 DIAGNOSIS — Z1211 Encounter for screening for malignant neoplasm of colon: Secondary | ICD-10-CM

## 2017-02-25 LAB — COMPREHENSIVE METABOLIC PANEL
ALT: 19 U/L (ref 0–35)
AST: 25 U/L (ref 0–37)
Albumin: 4 g/dL (ref 3.5–5.2)
Alkaline Phosphatase: 63 U/L (ref 39–117)
BILIRUBIN TOTAL: 0.5 mg/dL (ref 0.2–1.2)
BUN: 27 mg/dL — ABNORMAL HIGH (ref 6–23)
CO2: 23 meq/L (ref 19–32)
CREATININE: 1.35 mg/dL — AB (ref 0.40–1.20)
Calcium: 9.6 mg/dL (ref 8.4–10.5)
Chloride: 107 mEq/L (ref 96–112)
GFR: 50.31 mL/min — AB (ref >60.00)
GLUCOSE: 109 mg/dL — AB (ref 70–99)
Potassium: 4.1 mEq/L (ref 3.5–5.1)
Sodium: 138 mEq/L (ref 135–145)
Total Protein: 7.4 g/dL (ref 6.0–8.3)

## 2017-02-25 LAB — LIPID PANEL
CHOL/HDL RATIO: 3
Cholesterol: 143 mg/dL (ref 0–200)
HDL: 46.4 mg/dL (ref >39.00)
LDL CALC: 83 mg/dL (ref 0–99)
NonHDL: 96.43
TRIGLYCERIDES: 67 mg/dL (ref 0.0–149.0)
VLDL: 13.4 mg/dL (ref 0.0–40.0)

## 2017-02-25 LAB — HEMOGLOBIN A1C: Hgb A1c MFr Bld: 6.6 % — ABNORMAL HIGH (ref 4.6–6.5)
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
## 2017-02-25 NOTE — Patient Instructions (Addendum)
Please complete lab work prior to leaving. 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  Continue to work on health diet, exercise and weight loss. You will be contacted about your referral for colonoscopy and neurology.

## 2017-02-25 NOTE — Progress Notes (Signed)
Pre visit review using our clinic review tool, if applicable. No additional management support is needed unless otherwise documented below in the visit note. 

## 2017-03-01 ENCOUNTER — Encounter: Payer: Self-pay | Admitting: Neurology

## 2017-03-11 ENCOUNTER — Encounter: Payer: Self-pay | Admitting: Gastroenterology

## 2017-03-11 ENCOUNTER — Encounter: Payer: Self-pay | Admitting: Physician Assistant

## 2017-03-11 ENCOUNTER — Ambulatory Visit (INDEPENDENT_AMBULATORY_CARE_PROVIDER_SITE_OTHER): Payer: PPO | Admitting: Physician Assistant

## 2017-03-11 ENCOUNTER — Telehealth: Payer: Self-pay | Admitting: *Deleted

## 2017-03-11 VITALS — BP 124/76 | HR 74 | Ht 63.0 in | Wt 221.1 lb

## 2017-03-11 DIAGNOSIS — Z5181 Encounter for therapeutic drug level monitoring: Secondary | ICD-10-CM

## 2017-03-11 DIAGNOSIS — Z7901 Long term (current) use of anticoagulants: Secondary | ICD-10-CM

## 2017-03-11 DIAGNOSIS — Z1211 Encounter for screening for malignant neoplasm of colon: Secondary | ICD-10-CM

## 2017-03-11 MED ORDER — NA SULFATE-K SULFATE-MG SULF 17.5-3.13-1.6 GM/177ML PO SOLN: 1.0000 | Freq: Once | ORAL | 0 refills | Status: AC

## 2017-03-11 NOTE — Progress Notes (Signed)
Reviewed and agree with management plan.  Berdell Nevitt T. Alfreddie Consalvo, MD FACG 

## 2017-03-11 NOTE — Telephone Encounter (Signed)
Hold plavix x 7 days prior to procedure please.

## 2017-03-11 NOTE — Telephone Encounter (Signed)
  03/11/2017   RE: Suzanne Ali DOB: [birth year] MRN: [id number]   Dear  [name],    We have scheduled the above patient for an endoscopic procedure. Our records show that she is on anticoagulation therapy.   Please advise as to how long the patient may come off her therapy of Plavix prior to the procedure, which is scheduled for 03-22-2017.  Please route the completed form to [place] .  Sincerely,    Beatrix Fetters

## 2017-03-11 NOTE — Progress Notes (Signed)
Subjective:    Patient ID: Suzanne Ali, female    DOB: May 02, 1951, 66 y.o.   MRN: [id number]  HPI Suzanne Ali is a pleasant 66 year old African-American female, new to GI today referred by Suzanne Ali for screening colonoscopy. Patient has history of hypertension, adult-onset diabetes mellitus, chronic asthma and CVA in 2013. She says she has been on Plavix since then. She has no residual deficits, and says she wasn't sure she really had a stroke. She was living in [place] at that time. Patient has not had any prior GI evaluation, there is no family history of colon cancer that she is aware of. She denies any current problems with abdominal pain and changes in bowel habits melena or hematochezia.  Review of Systems Pertinent positive and negative review of systems were noted in the above HPI section.  All other review of systems was otherwise negative.  Outpatient Encounter Prescriptions as of 03/11/2017  Medication Sig  . acetaminophen (TYLENOL) 325 MG tablet Take 650 mg by mouth every 6 (six) hours as needed for mild pain, moderate pain or headache.  . albuterol (PROAIR HFA) 108 (90 Base) MCG/ACT inhaler Inhale 2 puffs into the lungs every 6 (six) hours as needed for wheezing or shortness of breath.  . betamethasone dipropionate (DIPROLENE) 0.05 % cream Apply topically 2 (two) times daily.  Marland Kitchen BREO ELLIPTA 100-25 MCG/INH AEPB INHALE 1 PUFF BY MOUTH INTO THE LUNGS DAILY.  Marland Kitchen clopidogrel (PLAVIX) 75 MG tablet TAKE 1 TABLET (75 MG TOTAL) BY MOUTH DAILY.  . hydrochlorothiazide (HYDRODIURIL) 25 MG tablet TAKE 1 TABLET (25 MG TOTAL) BY MOUTH DAILY.  Marland Kitchen lisinopril (PRINIVIL,ZESTRIL) 2.5 MG tablet Take 1 tablet (2.5 mg total) by mouth daily.  . rosuvastatin (CRESTOR) 40 MG tablet TAKE 1 TABLET (40 MG TOTAL) BY MOUTH DAILY.  Marland Kitchen triamcinolone (KENALOG) 0.025 % ointment APPLY 1 APPLICATION TOPICALLY 2 TIMES DAILY.  . Na Sulfate-K Sulfate-Mg Sulf 17.5-3.13-1.6 GM/180ML SOLN Take 1 kit by  mouth once.   No facility-administered encounter medications on file as of 03/11/2017.    Allergies  Allergen Reactions  . Benicar [Olmesartan]     hair loss, dry skin.  . Latex     Swelling/peeling skin  . Penicillins Hives  . Sulfa Antibiotics Hives   Patient Active Problem List   Diagnosis Date Noted  . Diabetes mellitus type 2, controlled, without complications ([place]) 05/39/7673  . Lung nodule 10/27/2015  . Syncope 10/18/2015  . 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 Mediastinal adenopathy 10/06/2015  . Reactive airway disease 10/05/2015  . History of CVA (cerebrovascular accident) 10/05/2015  . Contact dermatitis 10/05/2015  . Acute renal failure ([place]) 10/05/2015  . Vision problem 05/20/2014  . Carpal tunnel syndrome 01/18/2014  . Weight gain 10/27/2013  . Asthma, chronic 12/11/2012  . Hyperlipidemia 08/25/2012  . Dysphagia 05/22/2012   Social History   Social History  . Marital status: Single    Spouse name: N/A  . Number of children: 3  . Years of education: N/A   Occupational History  . Not on file.   Social History Main Topics  . Smoking status: Never Smoker  . Smokeless tobacco: Never Used  . Alcohol use No  . Drug use: No  . Sexual activity: Not on file   Other Topics Concern  . Not on file   Social History Narrative   [name], [place] (near [employer])   Lives alone- now staying with her daughter.   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            [name]'s family history includes CAD in her brother; Cancer in her father; Heart disease in her father and mother; Hypertension in her mother.      Objective:    Vitals:   03/11/17 0946  BP: 124/76  Pulse: 74    Physical Exam well-developed older African-American female in no acute distress, pleasant blood  pressure 124/76 pulse 74, height 5 foot 3, BMI 39.1. HEENT;nontraumatic normocephalic EOMI PERRLA sclera anicteric, CV; regular rate and rhythm with S1-S2 no murmur or gallop, Pulmonary; clear bilaterally, Abdomen ;large soft nontender nondistended bowel sounds are active there is no palpable mass or hepatosplenomegaly, Rectal; exam not done, EXt; no clubbing cyanosis or edema skin warm and dry, Neuropsych; mood and affect appropriate       Assessment & Plan:   #24 66 year old African-American female referred for colon cancer screening. Patient has not had any prior colon evaluation, currently asymptomatic, average risk #2 chronic antiplatelet therapy-on Plavix #3 history of CVA 2013 #4 hypertension #5 obesity  Plan; Patient will be scheduled for colonoscopy with Dr. Fuller Plan. Procedure discussed in detail with the patient including risks and benefits and she is agreeable to proceed. Patient will need to hold Plavix for 5 days prior to her procedure. We will communicate with her PCP Suzanne Alar NP to assure that this is reasonable for this patient. I discussed very small but real risk of thrombotic events while off antiplatelet therapy, and pt voiced understanding  Suzanne Ferguson PA-C 03/11/2017   Cc: Suzanne Alar, NP

## 2017-03-11 NOTE — Patient Instructions (Signed)
We have scheduled you for a colonoscopy for 2:30 Pm on May 11th,.  We will call you when we hear from [name] regarding the Plavix instructions.

## 2017-03-13 ENCOUNTER — Inpatient Hospital Stay: Admission: RE | Admit: 2017-03-13 | Payer: PPO | Source: Ambulatory Visit

## 2017-03-14 NOTE — Telephone Encounter (Signed)
LM for the patient on 03-14-2017 to stop her Plavix on 5-6 and resume it on 5-12, the day after the procedure.

## 2017-03-15 NOTE — Telephone Encounter (Signed)
Left another message on 03-15-2017 for the patient advising she should stop her Plavix starting on Sunday 03-17-2017.  -

## 2017-03-16 NOTE — Telephone Encounter (Signed)
Left pt message asking to call Allison back directly at [phone] to schedule AWV. Thanks! °

## 2017-03-21 ENCOUNTER — Encounter: Payer: Self-pay | Admitting: *Deleted

## 2017-03-21 ENCOUNTER — Other Ambulatory Visit: Payer: Self-pay | Admitting: Family

## 2017-03-21 ENCOUNTER — Encounter (HOSPITAL_BASED_OUTPATIENT_CLINIC_OR_DEPARTMENT_OTHER): Payer: Self-pay | Admitting: *Deleted

## 2017-03-21 ENCOUNTER — Telehealth: Payer: Self-pay | Admitting: Family

## 2017-03-21 ENCOUNTER — Telehealth: Payer: Self-pay | Admitting: *Deleted

## 2017-03-21 ENCOUNTER — Inpatient Hospital Stay: Admission: RE | Admit: 2017-03-21 | Payer: PPO | Source: Ambulatory Visit

## 2017-03-21 ENCOUNTER — Emergency Department (HOSPITAL_BASED_OUTPATIENT_CLINIC_OR_DEPARTMENT_OTHER)
Admission: EM | Admit: 2017-03-21 | Discharge: 2017-03-21 | Disposition: A | Discharge status: Home / Self Care | Payer: PPO | Attending: Emergency Medicine | Admitting: Emergency Medicine

## 2017-03-21 DIAGNOSIS — B370 Candidal stomatitis: Secondary | ICD-10-CM | POA: Insufficient documentation

## 2017-03-21 DIAGNOSIS — Z7902 Long term (current) use of antithrombotics/antiplatelets: Secondary | ICD-10-CM | POA: Diagnosis not present

## 2017-03-21 DIAGNOSIS — Z91040 Latex allergy status: Secondary | ICD-10-CM | POA: Diagnosis not present

## 2017-03-21 DIAGNOSIS — E1122 Type 2 diabetes mellitus with diabetic chronic kidney disease: Secondary | ICD-10-CM | POA: Insufficient documentation

## 2017-03-21 DIAGNOSIS — N186 End stage renal disease: Secondary | ICD-10-CM | POA: Insufficient documentation

## 2017-03-21 DIAGNOSIS — I120 Hypertensive chronic kidney disease with stage 5 chronic kidney disease or end stage renal disease: Secondary | ICD-10-CM | POA: Diagnosis not present

## 2017-03-21 DIAGNOSIS — T783XXA Angioneurotic edema, initial encounter: Secondary | ICD-10-CM | POA: Insufficient documentation

## 2017-03-21 DIAGNOSIS — J45909 Unspecified asthma, uncomplicated: Secondary | ICD-10-CM | POA: Diagnosis not present

## 2017-03-21 DIAGNOSIS — R229 Localized swelling, mass and lump, unspecified: Secondary | ICD-10-CM | POA: Diagnosis not present

## 2017-03-21 MED ORDER — DIPHENHYDRAMINE HCL 25 MG PO CAPS
25.0000 mg | ORAL_CAPSULE | Freq: Once | ORAL | Status: AC
  Administered 2017-03-21: 25 mg via ORAL
  Filled 2017-03-21: qty 1

## 2017-03-21 MED ORDER — PREDNISONE 50 MG PO TABS
60.0000 mg | ORAL_TABLET | Freq: Once | ORAL | Status: AC
  Administered 2017-03-21: 60 mg via ORAL
  Filled 2017-03-21: qty 1

## 2017-03-21 MED ORDER — PREDNISONE 20 MG PO TABS: 40.0000 mg | ORAL_TABLET | Freq: Every day | ORAL | 0 refills | Status: DC

## 2017-03-21 MED ORDER — FAMOTIDINE 20 MG PO TABS
20.0000 mg | ORAL_TABLET | Freq: Once | ORAL | Status: AC
  Administered 2017-03-21: 20 mg via ORAL
  Filled 2017-03-21: qty 1

## 2017-03-21 MED ORDER — FAMOTIDINE 20 MG PO TABS: 20.0000 mg | ORAL_TABLET | Freq: Two times a day (BID) | ORAL | 0 refills | Status: DC

## 2017-03-21 MED ORDER — NYSTATIN 100000 UNIT/ML MT SUSP: 500000.0000 [IU] | Freq: Four times a day (QID) | OROMUCOSAL | 0 refills | Status: DC

## 2017-03-21 MED FILL — NYSTATIN 100,000 UNITS/ML S: 100000 | 3 days supply | Qty: 60 | Fill #0

## 2017-03-21 MED FILL — BREO ELLIPTA 100-25 MCG INH: 100-25 | 30 days supply | Qty: 60 | Fill #3

## 2017-03-21 MED FILL — FAMOTIDINE 20 MG TABLET: 20 | 3 days supply | Qty: 6 | Fill #0

## 2017-03-21 MED FILL — SUPREP BOWEL PREP KIT: 17.5-3.13-1 | 1 days supply | Qty: 354 | Fill #0

## 2017-03-21 MED FILL — predniSONE 20 MG TABS: 20 | 3 days supply | Qty: 6 | Fill #0
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
## 2017-03-21 NOTE — Telephone Encounter (Signed)
-----   Message from Amado Coe, [place] sent at 03/21/2017  7:49 AM EDT -----   ----- Message ----- From: Pieter Partridge, DO Sent: 03/21/2017   7:44 AM To: Amado Coe, CMA  I am seeing this patient on Tuesday She was admitted to [facility] in June 2013 for stroke.  I will need these records in order to answer the question that the referring provider is asking me.

## 2017-03-21 NOTE — ED Notes (Signed)
ED Provider at bedside. 

## 2017-03-21 NOTE — Telephone Encounter (Signed)
Request faxed to [facility].  [phone]

## 2017-03-21 NOTE — ED Triage Notes (Signed)
Patient is alert and oriented x4.  She is complaining of oral swelling that started this morning.  She states that she has never had this issue before.  She is currently taking lisinopril.  Patient denies any airway or swallowing issues.  Currently she denies any pain.

## 2017-03-21 NOTE — Telephone Encounter (Signed)
Pt walked into the office with her daughter. Pt states swollen lips and tongue since this morning after eating a banana. Pt states has never had allergic reaction to bananas. Pt is visibly swollen in lips and slanted. Per daughter pt has hx stroke. Edwena Blow not here today, [place] advised staff to have pt go to ED immediately. Offered to walk them down but daughter says she will take her immediately on her own. cb

## 2017-03-21 NOTE — Telephone Encounter (Signed)
Please arrange ED follow up with me in the next 1 week.

## 2017-03-22 ENCOUNTER — Ambulatory Visit (AMBULATORY_SURGERY_CENTER): Payer: PPO | Admitting: Gastroenterology

## 2017-03-22 ENCOUNTER — Encounter: Payer: Self-pay | Admitting: Gastroenterology

## 2017-03-22 VITALS — BP 140/69 | HR 95 | Temp 98.4°F | Resp 20 | Ht 63.0 in | Wt 221.0 lb

## 2017-03-22 DIAGNOSIS — Z1211 Encounter for screening for malignant neoplasm of colon: Secondary | ICD-10-CM | POA: Diagnosis not present

## 2017-03-22 DIAGNOSIS — Z1212 Encounter for screening for malignant neoplasm of rectum: Secondary | ICD-10-CM | POA: Diagnosis not present

## 2017-03-22 DIAGNOSIS — Z8673 Personal history of transient ischemic attack (TIA), and cerebral infarction without residual deficits: Secondary | ICD-10-CM | POA: Diagnosis not present

## 2017-03-22 DIAGNOSIS — D120 Benign neoplasm of cecum: Secondary | ICD-10-CM | POA: Diagnosis not present

## 2017-03-22 DIAGNOSIS — D125 Benign neoplasm of sigmoid colon: Secondary | ICD-10-CM

## 2017-03-22 DIAGNOSIS — I10 Essential (primary) hypertension: Secondary | ICD-10-CM | POA: Diagnosis not present

## 2017-03-22 DIAGNOSIS — K633 Ulcer of intestine: Secondary | ICD-10-CM | POA: Diagnosis not present

## 2017-03-22 DIAGNOSIS — J45909 Unspecified asthma, uncomplicated: Secondary | ICD-10-CM | POA: Diagnosis not present

## 2017-03-22 DIAGNOSIS — K635 Polyp of colon: Secondary | ICD-10-CM

## 2017-03-22 MED ORDER — SODIUM CHLORIDE 0.9 % IV SOLN: 500.0000 mL | INTRAVENOUS | Status: DC

## 2017-03-22 MED FILL — ROSUVASTATIN CALCIUM 40 MG: 40 | 30 days supply | Qty: 30 | Fill #0

## 2017-03-22 MED FILL — CLOPIDOGREL 75 MG TABLET: 75 | 30 days supply | Qty: 30 | Fill #0

## 2017-03-22 NOTE — Op Note (Signed)
[facility] Patient Name: Suzanne Ali Procedure Date: 03/22/2017 2:07 PM MRN: [id number] Endoscopist: Ladene Artist , MD Age: 66 Referring MD:  Date of Birth: 08/01/1951 Gender: Female Account #: [id number] Procedure:                Colonoscopy Indications:              Screening for colorectal malignant neoplasm Medicines:                Monitored Anesthesia Care Procedure:                Pre-Anesthesia Assessment:                           - Prior to the procedure, a History and Physical                            was performed, and patient medications and                            allergies were reviewed. The patient's tolerance of                            previous anesthesia was also reviewed. The risks                            and benefits of the procedure and the sedation                            options and risks were discussed with the patient.                            All questions were answered, and informed consent                            was obtained. Prior Anticoagulants: The patient has                            taken Plavix (clopidogrel), last dose was 5 days                            prior to procedure. ASA Grade Assessment: III - A                            patient with severe systemic disease. After                            reviewing the risks and benefits, the patient was                            deemed in satisfactory condition to undergo the                            procedure.  After obtaining informed consent, the colonoscope                            was passed under direct vision. Throughout the                            procedure, the patient's blood pressure, pulse, and                            oxygen saturations were monitored continuously. The                            Model PCF-[id number]) scope was introduced                            through the anus and advanced to the the cecum,                             identified by appendiceal orifice and ileocecal                            valve. The ileocecal valve, appendiceal orifice,                            and rectum were photographed. The quality of the                            bowel preparation was good. The colonoscopy was                            performed without difficulty. The patient tolerated                            the procedure well. Scope In: 2:24:47 PM Scope Out: 2:42:32 PM Scope Withdrawal Time: 0 hours 11 minutes 6 seconds  Total Procedure Duration: 0 hours 17 minutes 45 seconds  Findings:                 The perianal and digital rectal examinations were                            normal.                           A 5 mm polyp was found in the sigmoid colon. The                            polyp was sessile. The polyp was removed with a                            cold snare. Resection and retrieval were complete.                           The exam was otherwise without abnormality on  direct and retroflexion views.                           A single localized non-bleeding erosion was found                            at the ileocecal valve. Biopsies were taken with a                            cold forceps for histology. Complications:            No immediate complications. Estimated blood loss:                            None. Estimated Blood Loss:     Estimated blood loss: none. Impression:               - One 5 mm polyp in the sigmoid colon, removed with                            a cold snare. Resected and retrieved.                           - The examination was otherwise normal on direct                            and retroflexion views.                           - A single erosion at the ileocecal valve. Biopsied. Recommendation:           - Repeat colonoscopy in 5 years for surveillance if                            polyp is precancerous.                            - Resume Plavix (clopidogrel) tomorrow at prior                            dose. Refer to managing physician for further                            adjustment of therapy.                           - Patient has a contact number available for                            emergencies. The signs and symptoms of potential                            delayed complications were discussed with the                            patient. Return to normal activities  tomorrow.                            Written discharge instructions were provided to the                            patient.                           - Resume previous diet.                           - Continue present medications.                           - Await pathology results. Ladene Artist, MD 03/22/2017 2:45:04 PM This report has been signed electronically.

## 2017-03-22 NOTE — Progress Notes (Signed)
Called to room to assist during endoscopic procedure.  Patient ID and intended procedure confirmed with present staff. Received instructions for my participation in the procedure from the performing physician.  

## 2017-03-22 NOTE — Telephone Encounter (Signed)
Refill sent per [facility] refill protocol/SLS   [name], NP    03/21/17 5:57 PM  Note    Please arrange ED follow up with me in the next 1 week

## 2017-03-22 NOTE — Telephone Encounter (Signed)
Pt has been scheduled.  °

## 2017-03-22 NOTE — Progress Notes (Signed)
Report to PACU, RN, vss, BBS= Clear.  

## 2017-03-22 NOTE — Patient Instructions (Addendum)
YOU HAD AN ENDOSCOPIC PROCEDURE TODAY AT [facility]:   Refer to the procedure report that was given to you for any specific questions about what was found during the examination.  If the procedure report does not answer your questions, please call your gastroenterologist to clarify.  If you requested that your care partner not be given the details of your procedure findings, then the procedure report has been included in a sealed envelope for you to review at your convenience later.  YOU SHOULD EXPECT: Some feelings of bloating in the abdomen. Passage of more gas than usual.  Walking can help get rid of the air that was put into your GI tract during the procedure and reduce the bloating. If you had a lower endoscopy (such as a colonoscopy or flexible sigmoidoscopy) you may notice spotting of blood in your stool or on the toilet paper. If you underwent a bowel prep for your procedure, you may not have a normal bowel movement for a few days.  Please Note:  You might notice some irritation and congestion in your nose or some drainage.  This is from the oxygen used during your procedure.  There is no need for concern and it should clear up in a day or so.  SYMPTOMS TO REPORT IMMEDIATELY:   Following lower endoscopy (colonoscopy or flexible sigmoidoscopy):  Excessive amounts of blood in the stool  Significant tenderness or worsening of abdominal pains  Swelling of the abdomen that is new, acute  Fever of 100F or higher   Following upper endoscopy (EGD)  Vomiting of blood or coffee ground material  New chest pain or pain under the shoulder blades  Painful or persistently difficult swallowing  New shortness of breath  Fever of 100F or higher  Black, tarry-looking stools  For urgent or emergent issues, a gastroenterologist can be reached at any hour by calling [phone].   DIET:  We do recommend a small meal at first, but then you may proceed to your regular diet.  Drink  plenty of fluids but you should avoid alcoholic beverages for 24 hours.  ACTIVITY:  You should plan to take it easy for the rest of today and you should NOT DRIVE or use heavy machinery until tomorrow (because of the sedation medicines used during the test).    FOLLOW UP: Our staff will call the number listed on your records the next business day following your procedure to check on you and address any questions or concerns that you may have regarding the information given to you following your procedure. If we do not reach you, we will leave a message.  However, if you are feeling well and you are not experiencing any problems, there is no need to return our call.  We will assume that you have returned to your regular daily activities without incident.  If any biopsies were taken you will be contacted by phone or by letter within the next 1-3 weeks.  Please call us at [phone] if you have not heard about the biopsies in 3 weeks.    SIGNATURES/CONFIDENTIALITY: You and/or your care partner have signed paperwork which will be entered into your electronic medical record.  These signatures attest to the fact that that the information above on your After Visit Summary has been reviewed and is understood.  Full responsibility of the confidentiality of this discharge information lies with you and/or your care-partner.  Polyp information given.  Resume Plavix tomorrow at previous dose.

## 2017-03-25 ENCOUNTER — Telehealth: Payer: Self-pay

## 2017-03-25 ENCOUNTER — Encounter: Payer: Self-pay | Admitting: Neurology

## 2017-03-25 ENCOUNTER — Ambulatory Visit (INDEPENDENT_AMBULATORY_CARE_PROVIDER_SITE_OTHER): Payer: PPO | Admitting: Neurology

## 2017-03-25 VITALS — BP 138/80 | HR 80 | Ht 63.0 in | Wt 221.0 lb

## 2017-03-25 DIAGNOSIS — Z794 Long term (current) use of insulin: Secondary | ICD-10-CM

## 2017-03-25 DIAGNOSIS — Z8673 Personal history of transient ischemic attack (TIA), and cerebral infarction without residual deficits: Secondary | ICD-10-CM | POA: Diagnosis not present

## 2017-03-25 DIAGNOSIS — E785 Hyperlipidemia, unspecified: Secondary | ICD-10-CM | POA: Diagnosis not present

## 2017-03-25 DIAGNOSIS — E119 Type 2 diabetes mellitus without complications: Secondary | ICD-10-CM

## 2017-03-25 DIAGNOSIS — I10 Essential (primary) hypertension: Secondary | ICD-10-CM

## 2017-03-25 NOTE — Telephone Encounter (Signed)
The phone number was incorrect, I looked up snap shot and called [phone] and left a message for pt.  Follow up call. maw

## 2017-03-25 NOTE — Patient Instructions (Signed)
1.  You may stop the Plavix.  Instead, take aspirin 81mg  daily. 2.  Continue the Crestor.  Continue diabetes control

## 2017-03-25 NOTE — Telephone Encounter (Signed)
Left a second message at [phone] for the pt to call us back if any questions or concerns. maw
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
## 2017-03-26 ENCOUNTER — Encounter: Payer: Self-pay | Admitting: Family

## 2017-03-26 ENCOUNTER — Ambulatory Visit (INDEPENDENT_AMBULATORY_CARE_PROVIDER_SITE_OTHER): Payer: PPO | Admitting: Family

## 2017-03-26 VITALS — BP 145/72 | HR 77 | Temp 98.1°F | Resp 18 | Ht 63.0 in | Wt 220.6 lb

## 2017-03-26 DIAGNOSIS — I10 Essential (primary) hypertension: Secondary | ICD-10-CM

## 2017-03-26 DIAGNOSIS — T783XXA Angioneurotic edema, initial encounter: Secondary | ICD-10-CM | POA: Diagnosis not present

## 2017-03-26 DIAGNOSIS — R05 Cough: Secondary | ICD-10-CM | POA: Diagnosis not present

## 2017-03-26 DIAGNOSIS — R059 Cough, unspecified: Secondary | ICD-10-CM

## 2017-03-26 NOTE — Telephone Encounter (Signed)
Has appointment scheduled today
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
## 2017-03-26 NOTE — Patient Instructions (Signed)
Continue claritin for allergies. Let me know if cough worsens, if you develop fever or if cough is not improved in 3 days.

## 2017-04-22 ENCOUNTER — Other Ambulatory Visit: Payer: Self-pay | Admitting: Family

## 2017-04-22 MED FILL — BREO ELLIPTA 100-25 MCG INH: 100-25 | 30 days supply | Qty: 60 | Fill #4

## 2017-04-22 MED FILL — HYDROCHLOROTHIAZIDE 25 MG T: 25 | 90 days supply | Qty: 90 | Fill #1

## 2017-04-23 MED FILL — ROSUVASTATIN CALCIUM 40 MG: 40 | 90 days supply | Qty: 90 | Fill #0

## 2017-05-10 DIAGNOSIS — R21 Rash and other nonspecific skin eruption: Secondary | ICD-10-CM | POA: Diagnosis not present

## 2017-05-10 DIAGNOSIS — L309 Dermatitis, unspecified: Secondary | ICD-10-CM | POA: Diagnosis not present

## 2017-05-27 ENCOUNTER — Encounter: Payer: Self-pay | Admitting: Family

## 2017-05-27 ENCOUNTER — Ambulatory Visit: Payer: PPO | Admitting: Family

## 2017-05-27 ENCOUNTER — Ambulatory Visit (INDEPENDENT_AMBULATORY_CARE_PROVIDER_SITE_OTHER): Payer: PPO | Admitting: Family

## 2017-05-27 VITALS — BP 140/91 | HR 73 | Temp 97.6°F | Resp 18 | Ht 63.0 in | Wt 217.0 lb

## 2017-05-27 DIAGNOSIS — E785 Hyperlipidemia, unspecified: Secondary | ICD-10-CM

## 2017-05-27 DIAGNOSIS — Z8673 Personal history of transient ischemic attack (TIA), and cerebral infarction without residual deficits: Secondary | ICD-10-CM

## 2017-05-27 DIAGNOSIS — E119 Type 2 diabetes mellitus without complications: Secondary | ICD-10-CM

## 2017-05-27 DIAGNOSIS — I10 Essential (primary) hypertension: Secondary | ICD-10-CM | POA: Diagnosis not present

## 2017-05-27 DIAGNOSIS — L259 Unspecified contact dermatitis, unspecified cause: Secondary | ICD-10-CM

## 2017-05-27 LAB — BASIC METABOLIC PANEL
BUN: 20 mg/dL (ref 6–23)
CALCIUM: 9.6 mg/dL (ref 8.4–10.5)
CO2: 26 meq/L (ref 19–32)
CREATININE: 0.98 mg/dL (ref 0.40–1.20)
Chloride: 101 mEq/L (ref 96–112)
GFR: 72.75 mL/min (ref >60.00)
GLUCOSE: 101 mg/dL — AB (ref 70–99)
Potassium: 3.6 mEq/L (ref 3.5–5.1)
SODIUM: 138 meq/L (ref 135–145)

## 2017-05-27 LAB — HEMOGLOBIN A1C: Hgb A1c MFr Bld: 6.3 % (ref 4.6–6.5)

## 2017-05-27 MED ORDER — ASPIRIN EC 81 MG PO TBEC: 81.0000 mg | DELAYED_RELEASE_TABLET | Freq: Every day | ORAL | Status: DC

## 2017-05-27 MED FILL — BREO ELLIPTA 100-25 MCG INH: 100-25 | 30 days supply | Qty: 60 | Fill #5
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
## 2017-05-27 NOTE — Assessment & Plan Note (Signed)
She stopped ASA. Add aspirin 81mg  once daily.

## 2017-05-27 NOTE — Assessment & Plan Note (Signed)
Stable on crestor, continue same.  

## 2017-05-27 NOTE — Assessment & Plan Note (Signed)
Clinically stable, obtain A1C.

## 2017-05-27 NOTE — Patient Instructions (Addendum)
Please complete lab work prior to leaving. Add aspirin 81mg  once daily.

## 2017-05-27 NOTE — Assessment & Plan Note (Signed)
Stable on hctz, continue same.

## 2017-06-04 ENCOUNTER — Encounter: Payer: Self-pay | Admitting: Gastroenterology

## 2017-06-04 ENCOUNTER — Ambulatory Visit (INDEPENDENT_AMBULATORY_CARE_PROVIDER_SITE_OTHER): Payer: PPO | Admitting: Internal Medicine

## 2017-06-04 ENCOUNTER — Ambulatory Visit (AMBULATORY_SURGERY_CENTER): Payer: Self-pay | Admitting: *Deleted

## 2017-06-04 ENCOUNTER — Encounter: Payer: Self-pay | Admitting: Internal Medicine

## 2017-06-04 VITALS — Ht 63.0 in | Wt 221.0 lb

## 2017-06-04 VITALS — BP 128/80 | HR 75 | Ht 63.0 in | Wt 220.0 lb

## 2017-06-04 DIAGNOSIS — K633 Ulcer of intestine: Secondary | ICD-10-CM

## 2017-06-04 DIAGNOSIS — R918 Other nonspecific abnormal finding of lung field: Secondary | ICD-10-CM | POA: Diagnosis not present

## 2017-06-04 DIAGNOSIS — J683 Other acute and subacute respiratory conditions due to chemicals, gases, fumes and vapors: Secondary | ICD-10-CM

## 2017-06-04 MED ORDER — NA SULFATE-K SULFATE-MG SULF 17.5-3.13-1.6 GM/177ML PO SOLN: 1.0000 [IU] | Freq: Once | ORAL | 0 refills | Status: AC

## 2017-06-04 MED FILL — SUPREP BOWEL PREP KIT: 17.5-3.13-1 | 1 days supply | Qty: 354 | Fill #0

## 2017-06-04 NOTE — Patient Instructions (Signed)
Reactive airways dysfunction syndrome, moderate persistent, uncomplicated (HCC)  - stable disease  - continue breo daily as before  - albtuerol as needed - flu shot in fall  - in 3 months do Pre-bd spiro and dlco only. No lung volume or bd response. No post-bd spiro    Multiple lung nodules on CT  - you need you annual CT chest  -do CT chest wo contrast in 3 months  Followup  -  3 months after PFT and CT   - return sooner if needed

## 2017-06-04 NOTE — Addendum Note (Signed)
Addended by: Len Blalock on: 06/04/2017 12:27 PM   Modules accepted: Orders
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
## 2017-06-04 NOTE — Progress Notes (Signed)
No egg or soy allergy known to patient  No issues with past sedation with any surgeries  or procedures, no intubation problems  No diet pills per patient No home 02 use per patient  No blood thinners per patient  Pt denies issues with constipation  No A fib or A flutter  EMMI video sent to pt's e mail   Pt. Has no email/declined

## 2017-06-14 ENCOUNTER — Ambulatory Visit (AMBULATORY_SURGERY_CENTER): Payer: PPO | Admitting: Gastroenterology

## 2017-06-14 ENCOUNTER — Encounter: Payer: Self-pay | Admitting: Gastroenterology

## 2017-06-14 VITALS — BP 169/83 | HR 59 | Temp 98.7°F | Resp 20 | Ht 63.0 in | Wt 221.0 lb

## 2017-06-14 DIAGNOSIS — R933 Abnormal findings on diagnostic imaging of other parts of digestive tract: Secondary | ICD-10-CM

## 2017-06-14 DIAGNOSIS — E119 Type 2 diabetes mellitus without complications: Secondary | ICD-10-CM | POA: Diagnosis not present

## 2017-06-14 DIAGNOSIS — K633 Ulcer of intestine: Secondary | ICD-10-CM

## 2017-06-14 DIAGNOSIS — J45909 Unspecified asthma, uncomplicated: Secondary | ICD-10-CM | POA: Diagnosis not present

## 2017-06-14 DIAGNOSIS — I10 Essential (primary) hypertension: Secondary | ICD-10-CM | POA: Diagnosis not present

## 2017-06-14 MED ORDER — SODIUM CHLORIDE 0.9 % IV SOLN: 500.0000 mL | INTRAVENOUS | Status: DC

## 2017-06-14 NOTE — Progress Notes (Signed)
  [facility] Post-op Note  Patient: Suzanne Ali  Procedure(s) Performed: colonoscopy  Patient Location: [facility] - Recovery Area  Anesthesia Type: Deep Sedation/Propofol  Level of Consciousness: awake, oriented and patient cooperative  Airway and Oxygen Therapy: Patient Spontanous Breathing  Post-op Pain: none  Post-op Assessment:  Post-op Vital signs reviewed, Patient's Cardiovascular Status Stable, Respiratory Function Stable, Patent Airway, No signs of Nausea or vomiting and Pain level controlled  Post-op Vital Signs: Reviewed and stable  Complications: No apparent anesthesia complications  Urijah Arko E Calloway Andrus 11:07 AM

## 2017-06-14 NOTE — Patient Instructions (Signed)
YOU HAD AN ENDOSCOPIC PROCEDURE TODAY AT [facility]:   Refer to the procedure report that was given to you for any specific questions about what was found during the examination.  If the procedure report does not answer your questions, please call your gastroenterologist to clarify.  If you requested that your care partner not be given the details of your procedure findings, then the procedure report has been included in a sealed envelope for you to review at your convenience later.  YOU SHOULD EXPECT: Some feelings of bloating in the abdomen. Passage of more gas than usual.  Walking can help get rid of the air that was put into your GI tract during the procedure and reduce the bloating. If you had a lower endoscopy (such as a colonoscopy or flexible sigmoidoscopy) you may notice spotting of blood in your stool or on the toilet paper. If you underwent a bowel prep for your procedure, you may not have a normal bowel movement for a few days.  Please Note:  You might notice some irritation and congestion in your nose or some drainage.  This is from the oxygen used during your procedure.  There is no need for concern and it should clear up in a day or so.  SYMPTOMS TO REPORT IMMEDIATELY:   Following lower endoscopy (colonoscopy or flexible sigmoidoscopy):  Excessive amounts of blood in the stool  Significant tenderness or worsening of abdominal pains  Swelling of the abdomen that is new, acute  Fever of 100F or higher   For urgent or emergent issues, a gastroenterologist can be reached at any hour by calling [phone].   DIET:  We do recommend a small meal at first, but then you may proceed to your regular diet.  Drink plenty of fluids but you should avoid alcoholic beverages for 24 hours. Try to eat more fiber and drink plenty of water.  ACTIVITY:  You should plan to take it easy for the rest of today and you should NOT DRIVE or use heavy machinery until tomorrow (because  of the sedation medicines used during the test).    FOLLOW UP: Our staff will call the number listed on your records the next business day following your procedure to check on you and address any questions or concerns that you may have regarding the information given to you following your procedure. If we do not reach you, we will leave a message.  However, if you are feeling well and you are not experiencing any problems, there is no need to return our call.  We will assume that you have returned to your regular daily activities without incident.  If any biopsies were taken you will be contacted by phone or by letter within the next 1-3 weeks.  Please call us at [phone] if you have not heard about the biopsies in 3 weeks.    SIGNATURES/CONFIDENTIALITY: You and/or your care partner have signed paperwork which will be entered into your electronic medical record.  These signatures attest to the fact that that the information above on your After Visit Summary has been reviewed and is understood.  Full responsibility of the confidentiality of this discharge information lies with you and/or your care-partner.  Read all of the handouts given to you by your recovery room nurse.   You will need another colonoscopy in 5 years.

## 2017-06-14 NOTE — Op Note (Signed)
[name] Procedure Date: 06/14/2017 10:40 AM MRN: [id number] Endoscopist: Ladene Artist , MD Age: 66 Referring MD:  Date of Birth: 08/12/1951 Gender: Female Account #: [id number] Procedure:                Colonoscopy Indications:              Abnormal virtual colonoscopy Medicines:                Monitored Anesthesia Care Procedure:                Pre-Anesthesia Assessment:                           - Prior to the procedure, a History and Physical                            was performed, and patient medications and                            allergies were reviewed. The patient's tolerance of                            previous anesthesia was also reviewed. The risks                            and benefits of the procedure and the sedation                            options and risks were discussed with the patient.                            All questions were answered, and informed consent                            was obtained. Prior Anticoagulants: The patient has                            taken no previous anticoagulant or antiplatelet                            agents. ASA Grade Assessment: III - A patient with                            severe systemic disease. After reviewing the risks                            and benefits, the patient was deemed in                            satisfactory condition to undergo the procedure.                           After obtaining informed consent, the colonoscope  was passed under direct vision. Throughout the                            procedure, the patient's blood pressure, pulse, and                            oxygen saturations were monitored continuously. The                            Colonoscope was introduced through the anus and                            advanced to the the cecum, identified by                            appendiceal orifice and ileocecal  valve. The                            ileocecal valve, appendiceal orifice, and rectum                            were photographed. The quality of the bowel                            preparation was good. The colonoscopy was performed                            without difficulty. The patient tolerated the                            procedure well. Scope In: 10:47:33 AM Scope Out: 11:01:34 AM Scope Withdrawal Time: 0 hours 9 minutes 15 seconds  Total Procedure Duration: 0 hours 14 minutes 1 second  Findings:                 The perianal and digital rectal examinations were                            normal.                           The exam was otherwise without abnormality on                            direct and retroflexion views. The erosion on the                            IC valve had completely healed and the IC valve                            appeared normal.                           A few medium-mouthed diverticula were found in the  sigmoid colon. There was no evidence of                            diverticular bleeding. Complications:            No immediate complications. Estimated blood loss:                            None. Estimated Blood Loss:     Estimated blood loss: none. Impression:               - Mild diverticulosis in the sigmoid colon. There                            was no evidence of diverticular bleeding.                           - Otherwise appeared normal on direct and                            retroflexed views.                           - No specimens collected. Recommendation:           - Repeat colonoscopy in 5 years for surveillance.                           - Patient has a contact number available for                            emergencies. The signs and symptoms of potential                            delayed complications were discussed with the                            patient. Return to normal activities  tomorrow.                            Written discharge instructions were provided to the                            patient.                           - Resume previous diet.                           - Continue present medications. Ladene Artist, MD 06/14/2017 11:17:13 AM This report has been signed electronically.

## 2017-06-14 NOTE — Progress Notes (Signed)
Pt's states no medical or surgical changes since previsit or office visit. 

## 2017-06-16 ENCOUNTER — Encounter: Payer: Self-pay | Admitting: Family

## 2017-06-17 ENCOUNTER — Telehealth: Payer: Self-pay

## 2017-06-17 NOTE — Telephone Encounter (Signed)
Called #[phone] and left a messaged we tried to reach pt for a follow up call. maw

## 2017-06-18 ENCOUNTER — Telehealth: Payer: Self-pay | Admitting: *Deleted

## 2017-06-18 NOTE — Telephone Encounter (Signed)
No answer for second call back. LEft message for patient to call with questions or concerns. SM

## 2017-07-23 ENCOUNTER — Other Ambulatory Visit: Payer: Self-pay | Admitting: Internal Medicine

## 2017-07-23 MED FILL — BREO ELLIPTA 100-25 MCG INH: 100-25 | 30 days supply | Qty: 60 | Fill #0

## 2017-07-30 ENCOUNTER — Other Ambulatory Visit: Payer: Self-pay | Admitting: Family

## 2017-07-30 MED FILL — ROSUVASTATIN CALCIUM 40 MG: 40 | 90 days supply | Qty: 90 | Fill #1

## 2017-07-30 MED FILL — HYDROCHLOROTHIAZIDE 25 MG T: 25 | 90 days supply | Qty: 90 | Fill #0

## 2017-08-12 ENCOUNTER — Inpatient Hospital Stay: Admission: RE | Admit: 2017-08-12 | Payer: PPO | Source: Ambulatory Visit

## 2017-08-26 ENCOUNTER — Ambulatory Visit (INDEPENDENT_AMBULATORY_CARE_PROVIDER_SITE_OTHER): Payer: PPO | Admitting: Family

## 2017-08-26 ENCOUNTER — Encounter: Payer: Self-pay | Admitting: Family

## 2017-08-26 VITALS — BP 145/85 | HR 81 | Temp 98.2°F | Resp 16 | Ht 63.0 in | Wt 216.8 lb

## 2017-08-26 DIAGNOSIS — E118 Type 2 diabetes mellitus with unspecified complications: Secondary | ICD-10-CM

## 2017-08-26 DIAGNOSIS — Z23 Encounter for immunization: Secondary | ICD-10-CM | POA: Diagnosis not present

## 2017-08-26 DIAGNOSIS — I10 Essential (primary) hypertension: Secondary | ICD-10-CM | POA: Diagnosis not present

## 2017-08-26 DIAGNOSIS — E785 Hyperlipidemia, unspecified: Secondary | ICD-10-CM

## 2017-08-26 DIAGNOSIS — E119 Type 2 diabetes mellitus without complications: Secondary | ICD-10-CM

## 2017-08-26 LAB — BASIC METABOLIC PANEL
BUN: 21 mg/dL (ref 6–23)
CALCIUM: 9.6 mg/dL (ref 8.4–10.5)
CO2: 28 mEq/L (ref 19–32)
Chloride: 101 mEq/L (ref 96–112)
Creatinine, Ser: 1.26 mg/dL — ABNORMAL HIGH (ref 0.40–1.20)
GFR: 54.4 mL/min — AB (ref >60.00)
GLUCOSE: 96 mg/dL (ref 70–99)
Potassium: 3.5 mEq/L (ref 3.5–5.1)
SODIUM: 140 meq/L (ref 135–145)

## 2017-08-26 LAB — HEMOGLOBIN A1C: Hgb A1c MFr Bld: 6.6 % — ABNORMAL HIGH (ref 4.6–6.5)

## 2017-08-26 MED ORDER — TRIAMCINOLONE ACETONIDE 0.1 % EX CREA: 1.0000 "application " | TOPICAL_CREAM | Freq: Two times a day (BID) | CUTANEOUS | 1 refills | Status: DC

## 2017-08-26 MED FILL — TRIAMCINOLONE 0.1% CREAM: 0.1 | 30 days supply | Qty: 80 | Fill #0

## 2017-08-26 NOTE — Assessment & Plan Note (Signed)
BP is improved on hctz. Continue same.

## 2017-08-26 NOTE — Patient Instructions (Signed)
Please complete lab work prior to leaving.   
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
## 2017-08-26 NOTE — Assessment & Plan Note (Signed)
Stable, continue crestor 

## 2017-08-26 NOTE — Assessment & Plan Note (Signed)
She has lost 5 pounds since her last visit.

## 2017-08-29 ENCOUNTER — Telehealth: Payer: Self-pay | Admitting: Family

## 2017-08-29 NOTE — Telephone Encounter (Signed)
Copied from [facility] #[id number]. Topic: Quick Communication - Lab Results >> Aug 29, 2017  9:14 AM Trenda Moots wrote: PT WOULD LIKE A CALL BACK REGARDING LAB RESULTS

## 2017-08-29 NOTE — Telephone Encounter (Signed)
Copied from [facility] #[id number]. Topic: Quick Communication - Lab Results >> Aug 29, 2017  9:12 AM Trenda Moots wrote:  A user error has taken place

## 2017-08-30 NOTE — Telephone Encounter (Signed)
Left detailed message on voicemail and to call if any questions.  Written by [name], NP on 08/26/2017 3:24 PM  Sugar is at goal and kidney function is stable.

## 2017-09-20 ENCOUNTER — Telehealth: Payer: Self-pay | Admitting: Internal Medicine

## 2017-09-20 NOTE — Telephone Encounter (Signed)
Called pt stating to her that we needed to reschedule her OV that was scheduled for 09/23/17 at 9:30 due to pt no showing for her CT scan that was scheduled 08/12/17. OV for 09/23/17 was supposed to be a 3 month follow up after CT scan.  Pt's last CT was done 04/02/16. Told pt that I was going to cancel her OV that was scheduled and that once she had the CT scan done, we would bring her in for an OV to follow up on the CT scan.  Pt was supposed to have had a PFT done at [place] but will reschedule the PFT when pt comes in for her OV after we make her a new appt.   CT has been rescheduled for 09/25/17 but a message had to be left for pt to call back to verify the new date of CT scan.   Will schedule OV with PFT after pt confirms CT appt that is 09/25/17.

## 2017-09-23 ENCOUNTER — Ambulatory Visit: Payer: PPO | Admitting: Internal Medicine

## 2017-09-23 ENCOUNTER — Ambulatory Visit (INDEPENDENT_AMBULATORY_CARE_PROVIDER_SITE_OTHER)
Admission: RE | Admit: 2017-09-23 | Discharge: 2017-09-23 | Disposition: A | Discharge status: Home / Self Care | Payer: PPO | Source: Ambulatory Visit | Attending: Internal Medicine | Admitting: Internal Medicine

## 2017-09-23 DIAGNOSIS — R918 Other nonspecific abnormal finding of lung field: Secondary | ICD-10-CM

## 2017-09-23 MED FILL — BREO ELLIPTA 100-25 MCG INH: 100-25 | 30 days supply | Qty: 60 | Fill #1

## 2017-09-23 MED FILL — TRIAMCINOLONE 0.1% CREAM: 0.1 | 30 days supply | Qty: 80 | Fill #1

## 2017-09-24 ENCOUNTER — Telehealth: Payer: Self-pay

## 2017-09-24 ENCOUNTER — Telehealth: Payer: Self-pay | Admitting: Internal Medicine

## 2017-09-24 DIAGNOSIS — K8689 Other specified diseases of pancreas: Secondary | ICD-10-CM

## 2017-09-24 NOTE — Telephone Encounter (Signed)
Called pt and advised message from the provider. Pt understood and verbalized understanding. Nothing further is needed.    

## 2017-09-24 NOTE — Telephone Encounter (Signed)
Please advise pt that I have placed an order for MRI of her pancreas for her to complete for further evaluation.

## 2017-09-24 NOTE — Addendum Note (Signed)
Addended by: [name] on: 09/24/2017 04:59 PM   Modules accepted: Orders

## 2017-09-24 NOTE — Telephone Encounter (Signed)
Call report was called in. MR please review.

## 2017-09-24 NOTE — Telephone Encounter (Signed)
PULMONARY No results for input(s): PHART, PCO2ART, PO2ART, HCO3, TCO2, O2SAT in the last 168 hours.  Invalid input(s): PCO2, PO2  CBC No results for input(s): HGB, HCT, WBC, PLT in the last 168 hours.  COAGULATION No results for input(s): INR in the last 168 hours.  CARDIAC  No results for input(s): TROPONINI in the last 168 hours. No results for input(s): PROBNP in the last 168 hours.   CHEMISTRY No results for input(s): NA, K, CL, CO2, GLUCOSE, BUN, CREATININE, CALCIUM, MG, PHOS in the last 168 hours. CrCl cannot be calculated (Patient's most recent lab result is older than the maximum 21 days allowed.).   LIVER No results for input(s): AST, ALT, ALKPHOS, BILITOT, PROT, ALBUMIN, INR in the last 168 hours.   INFECTIOUS No results for input(s): LATICACIDVEN, PROCALCITON in the last 168 hours.   ENDOCRINE CBG (last 3)  No results for input(s): GLUCAP in the last 72 hours.     LEt patient know that lung nodules areoverall stable/decresaed though there is 1 x  32mm LuL nodule that is new but unlikely this is cancer. Give her appt to discuss  Also, let her know that they are seeing something in the pancreas -a nd for this  A) tell her she needs to contact her PCP [name], NP  B) please call PCP [name], NP office and send this report to PCP so they can address this  C) I am also copying PCP [name], NP  [place]  Dr. Brand Males, M.D., [facility]C.P Pulmonary and Critical Care Medicine Staff Physician [place] [facility] Pager: [phone], If no answer or between  15:00h - 7:00h: call [phone]  09/24/2017 11:00 AM         IMAGING x48h  - image(s) personally visualized  -   highlighted in bold Ct Chest Wo Contrast  Result Date: 09/24/2017 CLINICAL DATA:  Followup of pulmonary nodules.  Nonsmoker. EXAM: CT CHEST WITHOUT CONTRAST TECHNIQUE: Multidetector CT imaging of the chest  was performed following the standard protocol without IV contrast. COMPARISON:  04/02/2016 FINDINGS: Cardiovascular: Aortic atherosclerosis. Tortuous thoracic aorta. Normal heart size, without pericardial effusion. Multivessel coronary artery atherosclerosis. Mediastinum/Nodes: Multiple small bilateral axillary nodes. No mediastinal or definite hilar adenopathy, given limitations of unenhanced CT. Tiny hiatal hernia. Lungs/Pleura: No pleural fluid. 5 mm left upper lobe pulmonary nodule on image 37/series 3, new. Minimal ground-glass and possible soft tissue density in the anterior left upper lobe on image 45/ series 3, new. A 4 mm left upper lobe pulmonary nodule is similar on image 66/ series 3. 4 mm subpleural left lower lobe pulmonary nodule on image 77/series 3, decreased from 5 mm previously. Upper Abdomen: Right hepatic lobe cyst. Multiple gallstones on the order of 5 mm. Normal imaged portions of the adrenal glands. Suspect a low-density lesion within the pancreatic neck at 2.1 cm on image 128/ series 2. Low-density bilateral renal lesions are likely cysts. Musculoskeletal: No acute osseous abnormality. IMPRESSION: 1. New 5 mm left upper lobe pulmonary nodule. Concurrent vague left upper lobe ground-glass and soft tissue density. Findings may be infectious. Correlate with infectious symptoms. No follow-up needed if patient is low-risk. Non-contrast chest CT can be considered in 12 months if patient is high-risk. This recommendation follows the consensus statement: Guidelines for Management of Incidental Pulmonary Nodules Detected on CT Images: From the Fleischner Society 2017; Radiology 2017; 284:228-243. 2. Other pulmonary nodules are similar and decreased  as detailed above. 3. Age advanced coronary artery atherosclerosis. Recommend assessment of coronary risk factors and consideration of medical therapy. 4.  Aortic Atherosclerosis (ICD10-I70.0). 5. **An incidental finding of potential clinical significance  has been found. Suspicion of a cystic lesion within the pancreatic neck. Consider further evaluation with pre and post contrast abdominal MRI (preferred) or pancreatic protocol CT.** 6. Cholelithiasis. 7.  Tiny hiatal hernia. These results will be called to the ordering clinician or representative by the Radiologist Assistant, and communication documented in the PACS or zVision Dashboard. Electronically Signed   By: Abigail Miyamoto M.D.   On: 09/24/2017 08:48

## 2017-09-24 NOTE — Telephone Encounter (Signed)
Spoke with pt. She had some questions about when she was supposed to follow up with MR. Per MR's AVS instructions from her last OV, pt was to follow up in 3 months after having a PFT and CT. Pt had her CT done today. I have scheduled her for a PFT and OV with MR on 10/31/17. Nothing further was needed.

## 2017-09-25 ENCOUNTER — Inpatient Hospital Stay: Admission: RE | Admit: 2017-09-25 | Payer: PPO | Source: Ambulatory Visit

## 2017-09-25 NOTE — Telephone Encounter (Signed)
Notified pt and she voices understanding and is agreeable to proceed with MRI.

## 2017-09-25 NOTE — Addendum Note (Signed)
Addended by: Kelle Darting A on: 09/25/2017 04:51 PM   Modules accepted: Orders

## 2017-09-30 ENCOUNTER — Ambulatory Visit (HOSPITAL_BASED_OUTPATIENT_CLINIC_OR_DEPARTMENT_OTHER): Payer: PPO

## 2017-10-05 ENCOUNTER — Ambulatory Visit (HOSPITAL_BASED_OUTPATIENT_CLINIC_OR_DEPARTMENT_OTHER)
Admission: RE | Admit: 2017-10-05 | Discharge: 2017-10-05 | Disposition: A | Discharge status: Home / Self Care | Payer: PPO | Source: Ambulatory Visit | Attending: Family | Admitting: Family

## 2017-10-05 DIAGNOSIS — K869 Disease of pancreas, unspecified: Secondary | ICD-10-CM | POA: Insufficient documentation

## 2017-10-05 DIAGNOSIS — Z1231 Encounter for screening mammogram for malignant neoplasm of breast: Secondary | ICD-10-CM | POA: Insufficient documentation

## 2017-10-05 DIAGNOSIS — E118 Type 2 diabetes mellitus with unspecified complications: Secondary | ICD-10-CM

## 2017-10-05 DIAGNOSIS — K8020 Calculus of gallbladder without cholecystitis without obstruction: Secondary | ICD-10-CM | POA: Insufficient documentation

## 2017-10-05 DIAGNOSIS — K8689 Other specified diseases of pancreas: Secondary | ICD-10-CM

## 2017-10-05 MED ORDER — GADOBENATE DIMEGLUMINE 529 MG/ML IV SOLN
20.0000 mL | Freq: Once | INTRAVENOUS | Status: AC | PRN
  Administered 2017-10-05: 19 mL via INTRAVENOUS

## 2017-10-06 ENCOUNTER — Encounter: Payer: Self-pay | Admitting: Family

## 2017-10-31 ENCOUNTER — Encounter: Payer: Self-pay | Admitting: Internal Medicine

## 2017-10-31 ENCOUNTER — Ambulatory Visit (INDEPENDENT_AMBULATORY_CARE_PROVIDER_SITE_OTHER): Payer: PPO | Admitting: Internal Medicine

## 2017-10-31 ENCOUNTER — Ambulatory Visit: Payer: PPO | Admitting: Internal Medicine

## 2017-10-31 ENCOUNTER — Other Ambulatory Visit: Payer: Self-pay | Admitting: Internal Medicine

## 2017-10-31 ENCOUNTER — Other Ambulatory Visit: Payer: Self-pay | Admitting: Family

## 2017-10-31 VITALS — BP 130/80 | HR 75 | Ht 63.0 in | Wt 218.8 lb

## 2017-10-31 DIAGNOSIS — I2584 Coronary atherosclerosis due to calcified coronary lesion: Secondary | ICD-10-CM | POA: Diagnosis not present

## 2017-10-31 DIAGNOSIS — J683 Other acute and subacute respiratory conditions due to chemicals, gases, fumes and vapors: Secondary | ICD-10-CM | POA: Diagnosis not present

## 2017-10-31 DIAGNOSIS — I2510 Atherosclerotic heart disease of native coronary artery without angina pectoris: Secondary | ICD-10-CM | POA: Diagnosis not present

## 2017-10-31 DIAGNOSIS — R918 Other nonspecific abnormal finding of lung field: Secondary | ICD-10-CM | POA: Diagnosis not present

## 2017-10-31 DIAGNOSIS — J45909 Unspecified asthma, uncomplicated: Secondary | ICD-10-CM

## 2017-10-31 DIAGNOSIS — R911 Solitary pulmonary nodule: Secondary | ICD-10-CM

## 2017-10-31 LAB — PULMONARY FUNCTION TEST
DL/VA % PRED: 86 %
DL/VA: 4.03 ml/min/mmHg/L
DLCO COR: 13.9 ml/min/mmHg
DLCO UNC % PRED: 62 %
DLCO cor % pred: 60 %
DLCO unc: 14.23 ml/min/mmHg
FEF 25-75 POST: 1.18 L/s
FEF 25-75 PRE: 0.83 L/s
FEF2575-%CHANGE-POST: 42 %
FEF2575-%PRED-POST: 68 %
FEF2575-%PRED-PRE: 48 %
FEV1-%CHANGE-POST: 4 %
FEV1-%PRED-POST: 63 %
FEV1-%Pred-Pre: 61 %
FEV1-Post: 1.16 L
FEV1-Pre: 1.11 L
FEV1FVC-%Change-Post: 11 %
FEV1FVC-%PRED-PRE: 98 %
FEV6-%CHANGE-POST: -6 %
FEV6-%PRED-POST: 59 %
FEV6-%Pred-Pre: 64 %
FEV6-Post: 1.34 L
FEV6-Pre: 1.43 L
FEV6FVC-%Pred-Post: 104 %
FEV6FVC-%Pred-Pre: 104 %
FVC-%Change-Post: -6 %
FVC-%Pred-Post: 57 %
FVC-%Pred-Pre: 61 %
FVC-Post: 1.34 L
FVC-Pre: 1.43 L
POST FEV1/FVC RATIO: 86 %
PRE FEV1/FVC RATIO: 77 %
Post FEV6/FVC ratio: 100 %
Pre FEV6/FVC Ratio: 100 %
RV % pred: 164 %
RV: 3.43 L
TLC % pred: 126 %
TLC: 6.21 L

## 2017-10-31 MED ORDER — TIOTROPIUM BROMIDE MONOHYDRATE 2.5 MCG/ACT IN AERS: 2.0000 | INHALATION_SPRAY | Freq: Every day | RESPIRATORY_TRACT | 5 refills | Status: DC

## 2017-10-31 MED ORDER — TIOTROPIUM BROMIDE MONOHYDRATE 2.5 MCG/ACT IN AERS: 2.0000 | INHALATION_SPRAY | Freq: Every day | RESPIRATORY_TRACT | 0 refills | Status: DC

## 2017-10-31 MED FILL — SPIRIVA RESPIMAT INHAL SPRY: 2.5 | 30 days supply | Qty: 4 | Fill #0

## 2017-10-31 MED FILL — BREO ELLIPTA 100-25 MCG INH: 100-25 | 30 days supply | Qty: 60 | Fill #2

## 2017-10-31 NOTE — Addendum Note (Signed)
Addended by: Lorretta Harp on: 10/31/2017 11:34 AM   Modules accepted: Orders

## 2017-10-31 NOTE — Addendum Note (Signed)
Addended by: Lorretta Harp on: 10/31/2017 11:16 AM   Modules accepted: Orders

## 2017-10-31 NOTE — Progress Notes (Signed)
PFT completed today 10/31/17 
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
## 2017-10-31 NOTE — Patient Instructions (Addendum)
ICD-10-CM   1. Reactive airways dysfunction syndrome, moderate persistent, uncomplicated (HCC) T46.5   2. Multiple lung nodules on CT R91.8   3. Coronary artery calcification I25.10    I25.84    Reactive airways dysfunction syndrome, moderate persistent, uncomplicated ([place])  - glad stable but lung funciton shows decline - continue breo but add spiriva scheduled  Multiple lung nodules on CT - old ones are stable/imprved but there is a new one 54mm left upper lobe Nov 2018  - repeat ct chest without contrast in  1 year  Coronary artery calcification - refer cardiology esp with history of diabetes  Followup 6 months or sooner if needed

## 2017-10-31 NOTE — Progress Notes (Signed)
pf

## 2017-11-11 MED FILL — TRIAMCINOLONE 0.1% CREAM: 0.1 | 30 days supply | Qty: 80 | Fill #0

## 2017-11-11 MED FILL — HYDROCHLOROTHIAZIDE 25 MG T: 25 | 90 days supply | Qty: 90 | Fill #0

## 2017-11-11 MED FILL — ROSUVASTATIN CALCIUM 40 MG: 40 | 90 days supply | Qty: 90 | Fill #0

## 2017-11-21 ENCOUNTER — Telehealth: Payer: Self-pay | Admitting: Family

## 2017-11-21 NOTE — Telephone Encounter (Signed)
Copied from [place] [id number]. Topic: Quick Communication - Office Called Patient >> Nov 21, 2017  4:07 PM Rosalin Hawking wrote: Reason for CRM: LVM to Rescheduled appt  LVM for pt to reschedule appt on 11-25-2017 (provider not going to be in office)

## 2017-11-25 ENCOUNTER — Ambulatory Visit (INDEPENDENT_AMBULATORY_CARE_PROVIDER_SITE_OTHER): Payer: PPO | Admitting: Family

## 2017-11-25 ENCOUNTER — Encounter: Payer: Self-pay | Admitting: Family

## 2017-11-25 VITALS — BP 121/68 | HR 88 | Temp 97.6°F | Resp 16 | Ht 63.0 in | Wt 217.0 lb

## 2017-11-25 DIAGNOSIS — I10 Essential (primary) hypertension: Secondary | ICD-10-CM | POA: Diagnosis not present

## 2017-11-25 DIAGNOSIS — L309 Dermatitis, unspecified: Secondary | ICD-10-CM | POA: Diagnosis not present

## 2017-11-25 DIAGNOSIS — E1122 Type 2 diabetes mellitus with diabetic chronic kidney disease: Secondary | ICD-10-CM

## 2017-11-25 DIAGNOSIS — E785 Hyperlipidemia, unspecified: Secondary | ICD-10-CM | POA: Diagnosis not present

## 2017-11-25 LAB — COMPREHENSIVE METABOLIC PANEL
ALT: 12 U/L (ref 0–35)
AST: 18 U/L (ref 0–37)
Albumin: 4.2 g/dL (ref 3.5–5.2)
Alkaline Phosphatase: 67 U/L (ref 39–117)
BILIRUBIN TOTAL: 0.5 mg/dL (ref 0.2–1.2)
BUN: 23 mg/dL (ref 6–23)
CALCIUM: 9.3 mg/dL (ref 8.4–10.5)
CHLORIDE: 102 meq/L (ref 96–112)
CO2: 28 meq/L (ref 19–32)
Creatinine, Ser: 1.13 mg/dL (ref 0.40–1.20)
GFR: 61.63 mL/min (ref >60.00)
GLUCOSE: 115 mg/dL — AB (ref 70–99)
POTASSIUM: 3.5 meq/L (ref 3.5–5.1)
Sodium: 139 mEq/L (ref 135–145)
Total Protein: 7.6 g/dL (ref 6.0–8.3)

## 2017-11-25 LAB — LIPID PANEL
CHOL/HDL RATIO: 3
Cholesterol: 136 mg/dL (ref 0–200)
HDL: 50.8 mg/dL (ref >39.00)
LDL Cholesterol: 65 mg/dL (ref 0–99)
NonHDL: 84.85
TRIGLYCERIDES: 97 mg/dL (ref 0.0–149.0)
VLDL: 19.4 mg/dL (ref 0.0–40.0)

## 2017-11-25 LAB — HEMOGLOBIN A1C: HEMOGLOBIN A1C: 6.3 % (ref 4.6–6.5)

## 2017-11-25 MED ORDER — BETAMETHASONE DIPROPIONATE 0.05 % EX CREA: TOPICAL_CREAM | Freq: Two times a day (BID) | CUTANEOUS | 1 refills | Status: DC
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
## 2017-11-25 NOTE — Patient Instructions (Signed)
Continue diprolene cream twice daily to hands. You may add zyrtec 10mg  once daily as needed for itching. You should be contacted about your referral to dermatology.  Let me know if you have not heard back in 1 week.

## 2017-11-26 NOTE — Progress Notes (Signed)
Letter mailed out to pt

## 2017-12-09 ENCOUNTER — Ambulatory Visit: Payer: PPO | Admitting: Internal Medicine

## 2017-12-09 ENCOUNTER — Encounter: Payer: Self-pay | Admitting: Internal Medicine

## 2017-12-09 VITALS — BP 138/76 | HR 74 | Ht 63.0 in | Wt 218.0 lb

## 2017-12-09 DIAGNOSIS — I2510 Atherosclerotic heart disease of native coronary artery without angina pectoris: Secondary | ICD-10-CM

## 2017-12-09 DIAGNOSIS — I2584 Coronary atherosclerosis due to calcified coronary lesion: Secondary | ICD-10-CM | POA: Diagnosis not present

## 2017-12-09 DIAGNOSIS — E782 Mixed hyperlipidemia: Secondary | ICD-10-CM | POA: Diagnosis not present

## 2017-12-09 DIAGNOSIS — I10 Essential (primary) hypertension: Secondary | ICD-10-CM | POA: Diagnosis not present

## 2017-12-09 DIAGNOSIS — R55 Syncope and collapse: Secondary | ICD-10-CM

## 2017-12-09 MED FILL — TRIAMCINOLONE 0.1% CREAM: 0.1 | 30 days supply | Qty: 80 | Fill #1
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
## 2017-12-09 NOTE — Patient Instructions (Signed)
Your physician recommends that you continue on your current medications as directed. Please refer to the Current Medication list given to you today.  Your physician has requested that you have an exercise tolerance test. For further information please visit [email or web address]. Please also follow instruction sheet, as given.  Your physician wants you to follow-up in: October, 2019 with Dr. Harrington Challenger.  You will receive a reminder letter in the mail two months in advance. If you don't receive a letter, please call our office to schedule the follow-up appointment.

## 2017-12-26 ENCOUNTER — Other Ambulatory Visit: Payer: Self-pay | Admitting: Internal Medicine

## 2017-12-26 DIAGNOSIS — I2584 Coronary atherosclerosis due to calcified coronary lesion: Principal | ICD-10-CM

## 2017-12-26 DIAGNOSIS — I2510 Atherosclerotic heart disease of native coronary artery without angina pectoris: Secondary | ICD-10-CM

## 2017-12-26 DIAGNOSIS — R0602 Shortness of breath: Secondary | ICD-10-CM

## 2017-12-30 ENCOUNTER — Ambulatory Visit (INDEPENDENT_AMBULATORY_CARE_PROVIDER_SITE_OTHER): Payer: PPO

## 2017-12-30 DIAGNOSIS — I2510 Atherosclerotic heart disease of native coronary artery without angina pectoris: Secondary | ICD-10-CM | POA: Diagnosis not present

## 2017-12-30 DIAGNOSIS — I2584 Coronary atherosclerosis due to calcified coronary lesion: Secondary | ICD-10-CM

## 2017-12-30 DIAGNOSIS — R0602 Shortness of breath: Secondary | ICD-10-CM

## 2018-01-01 LAB — EXERCISE TOLERANCE TEST
CSEPHR: 82 %
CSEPPHR: 125 {beats}/min
Estimated workload: 6.8 METS
Exercise duration (min): 4 min
Exercise duration (sec): 55 s
MPHR: 153 {beats}/min
RPE: 19
Rest HR: 71 {beats}/min

## 2018-02-03 ENCOUNTER — Other Ambulatory Visit: Payer: Self-pay | Admitting: Family

## 2018-02-03 DIAGNOSIS — L239 Allergic contact dermatitis, unspecified cause: Secondary | ICD-10-CM | POA: Diagnosis not present

## 2018-02-03 MED FILL — BREO ELLIPTA 100-25 MCG INH: 100-25 | 30 days supply | Qty: 60 | Fill #3

## 2018-02-03 MED FILL — ROSUVASTATIN CALCIUM 40 MG: 40 | 90 days supply | Qty: 90 | Fill #1

## 2018-02-03 MED FILL — SPIRIVA RESPIMAT INHAL SPRY: 2.5 | 30 days supply | Qty: 4 | Fill #1

## 2018-02-03 MED FILL — HYDROCHLOROTHIAZIDE 25 MG T: 25 | 90 days supply | Qty: 90 | Fill #1

## 2018-02-04 MED FILL — TRIAMCINOLONE ACETONIDE 0.1: 0.1 | 30 days supply | Qty: 80 | Fill #0

## 2018-02-06 MED FILL — BETAMETHASONE DP 0.05% CRM: 0.05 | 30 days supply | Qty: 45 | Fill #0

## 2018-03-24 DIAGNOSIS — L309 Dermatitis, unspecified: Secondary | ICD-10-CM | POA: Diagnosis not present

## 2018-03-24 MED FILL — TRIAMCINOLONE ACETONIDE 0.1: 0.1 | 30 days supply | Qty: 80 | Fill #1

## 2018-03-24 MED FILL — HALOBETASOL PROPIONATE 0.05: 0.05 | 30 days supply | Qty: 50 | Fill #0

## 2018-05-12 DIAGNOSIS — L02511 Cutaneous abscess of right hand: Secondary | ICD-10-CM | POA: Diagnosis not present

## 2018-05-12 DIAGNOSIS — L0889 Other specified local infections of the skin and subcutaneous tissue: Secondary | ICD-10-CM | POA: Diagnosis not present

## 2018-05-12 DIAGNOSIS — B9689 Other specified bacterial agents as the cause of diseases classified elsewhere: Secondary | ICD-10-CM | POA: Diagnosis not present

## 2018-05-12 DIAGNOSIS — L980 Pyogenic granuloma: Secondary | ICD-10-CM | POA: Diagnosis not present

## 2018-05-12 MED FILL — DOXYCYCLINE MONO 100 MG CAP: 100 | 10 days supply | Qty: 20 | Fill #0

## 2018-05-12 MED FILL — MUPIROCIN 2% OINTMENT: 2 | 10 days supply | Qty: 22 | Fill #0

## 2018-05-19 DIAGNOSIS — L308 Other specified dermatitis: Secondary | ICD-10-CM | POA: Diagnosis not present

## 2018-06-27 ENCOUNTER — Encounter: Payer: Self-pay | Admitting: Family

## 2018-06-27 ENCOUNTER — Ambulatory Visit: Payer: PPO | Admitting: Family

## 2018-06-27 ENCOUNTER — Ambulatory Visit (INDEPENDENT_AMBULATORY_CARE_PROVIDER_SITE_OTHER): Payer: PPO | Admitting: Family

## 2018-06-27 VITALS — BP 132/78 | HR 79 | Temp 97.7°F | Resp 16 | Ht 63.0 in | Wt 205.0 lb

## 2018-06-27 DIAGNOSIS — E118 Type 2 diabetes mellitus with unspecified complications: Secondary | ICD-10-CM

## 2018-06-27 DIAGNOSIS — I10 Essential (primary) hypertension: Secondary | ICD-10-CM

## 2018-06-27 DIAGNOSIS — E785 Hyperlipidemia, unspecified: Secondary | ICD-10-CM | POA: Diagnosis not present

## 2018-06-27 MED ORDER — HYDROCHLOROTHIAZIDE 25 MG PO TABS: ORAL_TABLET | ORAL | 1 refills | Status: DC

## 2018-06-27 MED ORDER — ROSUVASTATIN CALCIUM 40 MG PO TABS: ORAL_TABLET | ORAL | 1 refills | Status: DC

## 2018-06-27 MED FILL — HYDROCHLOROTHIAZIDE 25 MG T: 25 | 90 days supply | Qty: 90 | Fill #0

## 2018-06-27 MED FILL — BREO ELLIPTA 100-25 MCG INH: 100-25 | 30 days supply | Qty: 60 | Fill #4

## 2018-06-27 MED FILL — SPIRIVA RESPIMAT INHAL SPRY: 2.5 | 30 days supply | Qty: 4 | Fill #2

## 2018-06-27 MED FILL — ROSUVASTATIN CALCIUM 40 MG: 40 | 90 days supply | Qty: 90 | Fill #0
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
## 2018-06-27 NOTE — Patient Instructions (Signed)
Please complete lab work prior to leaving.  Continue current meds.  

## 2018-06-28 ENCOUNTER — Encounter: Payer: Self-pay | Admitting: Family

## 2018-06-28 LAB — HEMOGLOBIN A1C
Hgb A1c MFr Bld: 5.9 % of total Hgb — ABNORMAL HIGH (ref <5.7)
Mean Plasma Glucose: 123 (calc)
eAG (mmol/L): 6.8 (calc)

## 2018-06-28 LAB — BASIC METABOLIC PANEL
BUN/Creatinine Ratio: 19 (calc) (ref 6–22)
BUN: 26 mg/dL — AB (ref 7–25)
CALCIUM: 9.3 mg/dL (ref 8.6–10.4)
CHLORIDE: 106 mmol/L (ref 98–110)
CO2: 22 mmol/L (ref 20–32)
Creat: 1.37 mg/dL — ABNORMAL HIGH (ref 0.50–0.99)
Glucose, Bld: 76 mg/dL (ref 65–99)
POTASSIUM: 4 mmol/L (ref 3.5–5.3)
Sodium: 139 mmol/L (ref 135–146)

## 2018-08-05 IMAGING — MR MR ABDOMEN WO/W CM
12 of 21 series · 22 of 48 positions shown · IV contrast (multihance)
Comparison: No prior abdominal MRI. CT of the thorax without
contrast 09/23/2017.

CLINICAL DATA: 66-year-old female with history of pancreatic mass.
Follow-up for prior abnormal CT examination.

EXAM:
MRI ABDOMEN WITHOUT AND WITH CONTRAST
TECHNIQUE: Multiplanar multisequence MR imaging of the abdomen was performed
both before and after the administration of intravenous contrast.
CONTRAST:  19mL MULTIHANCE GADOBENATE DIMEGLUMINE 529 MG/ML IV SOLN

[Series 4: T2 · coronal · 7.0mm · 1.56mm/px · 1 of 33 slices shown (1 of 2)]
[im 1/33]
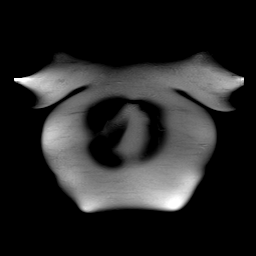

[Series 8: in + out · axial · 6.0mm · 0.78mm/px · z∈[-54,+163]mm · 3 of 60 slices shown]
[im 1/60]
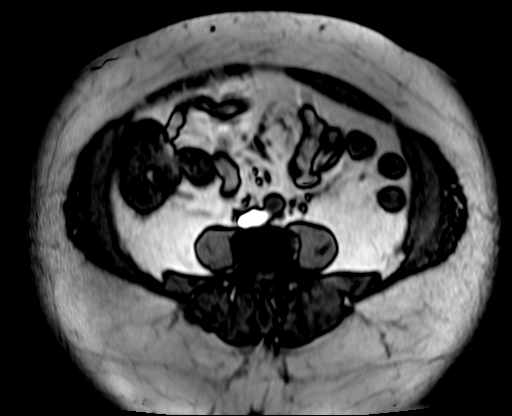
[im 30/60]
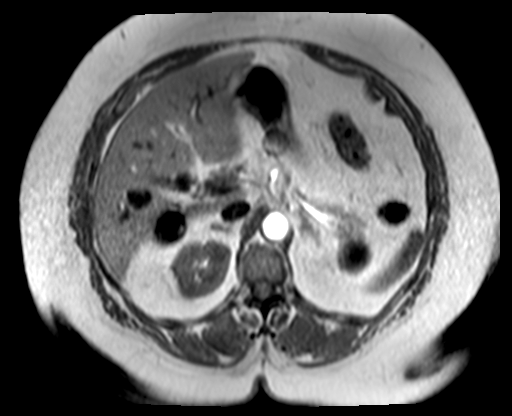
[im 60/60]
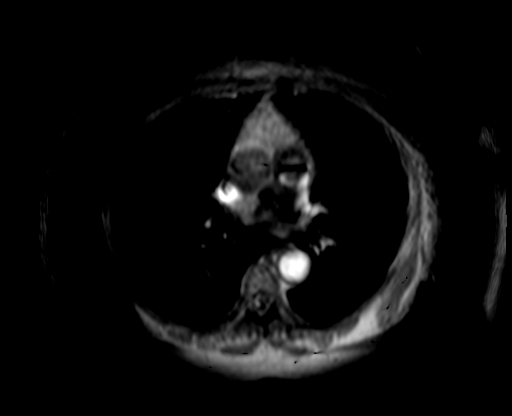

[Series 10: T2 fat-sat · axial · 6.0mm · 1.56mm/px · 1 of 32 slices shown]
[im 1/32]
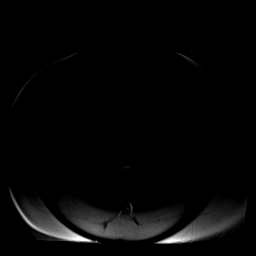

[Series 11: DWI · axial · 6.0mm · 0.78mm/px · 1 of 30 slices shown]
[im 1/30]
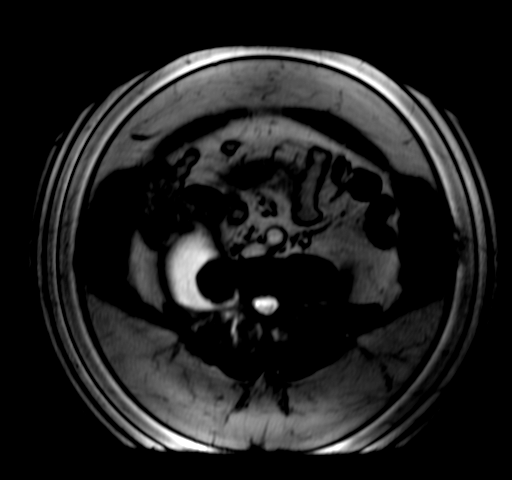

[Series 12: ep2d_[id number]_500_800 free breathing · axial · 6.0mm · 2.08mm/px · z∈[-50,+182]mm · 4 of 96 slices shown]
[im 1/96]
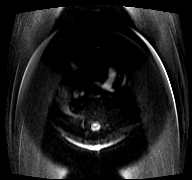
[im 32/96]
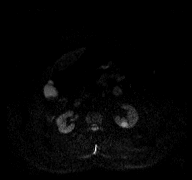
[im 64/96]
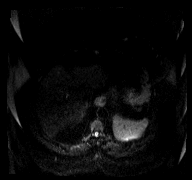
[im 96/96]
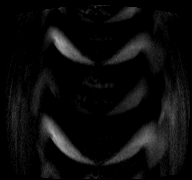

[Series 13: ep2d_[id number]_800 free breathing_adc · axial · 6.0mm · 2.08mm/px · 1 of 30 slices shown]
[im 1/30]
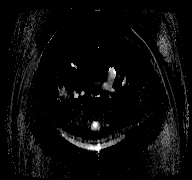

[Series 14: T1 fat-sat · axial · 6.0mm · 1.56mm/px · 1 of 30 slices shown]
[im 1/30]
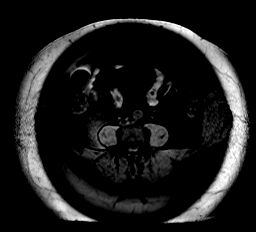

[Series 23: T2 · axial · 6.0mm · 1.56mm/px · 1 of 30 slices shown (2 of 2)]
[im 1/30]
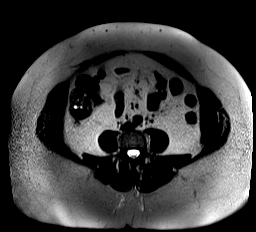

[Series 24: T1 dynamic fat-sat post-contrast · axial · delayed · 4.0mm · 0.78mm/px · z∈[-64,+172]mm · 3 of 60 slices shown]
[im 1/60]
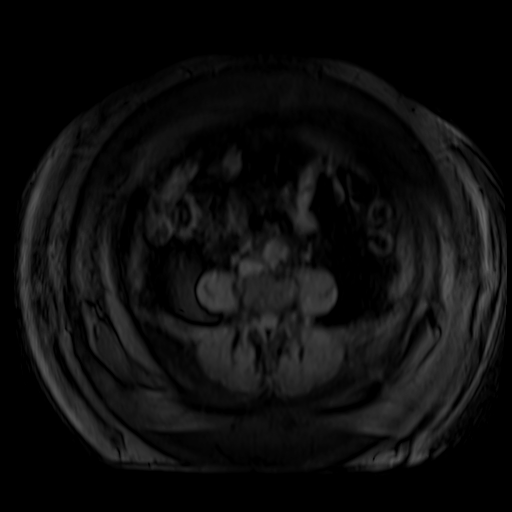
[im 30/60]
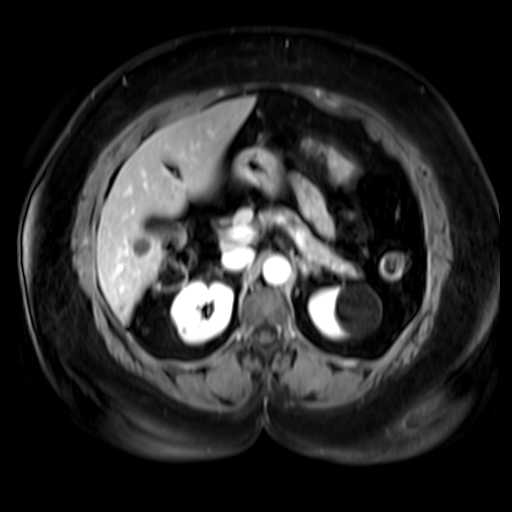
[im 60/60]
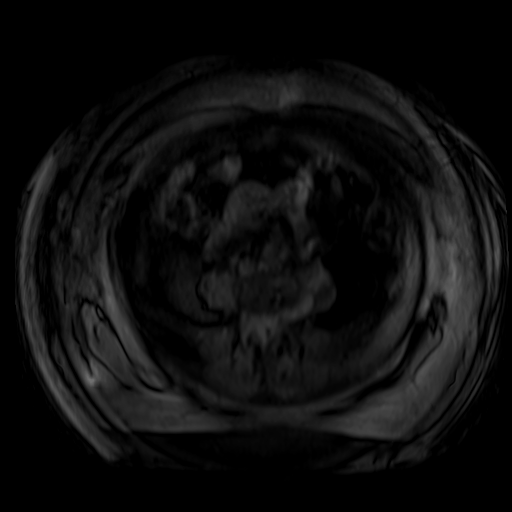

[Series 100: spin · axial · 0.66mm/px · 1 of 9 slices shown]
[im 1/9]
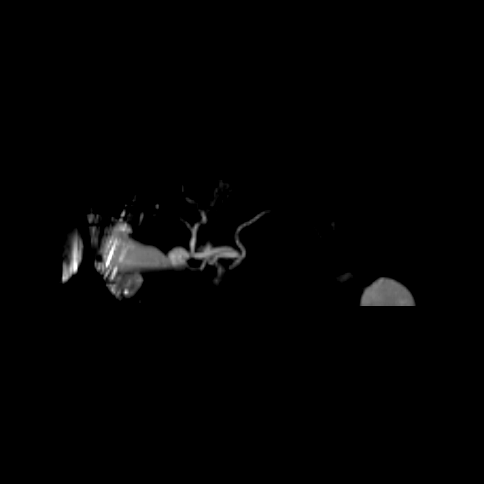

[Series 101: hx · coronal · 1.6mm · 1.25mm/px · 2 of 52 slices shown]
[im 1/52]
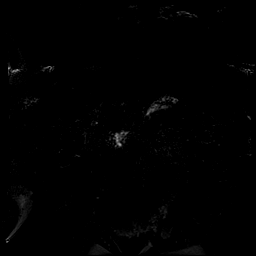
[im 52/52]
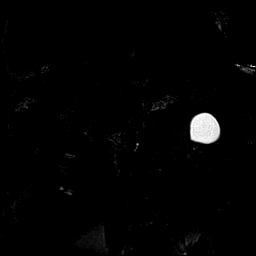

[Series 102: sub_s24-s15_1 · axial · 4.0mm · 0.78mm/px · z∈[-64,+172]mm · 3 of 60 slices shown]
[im 1/60]
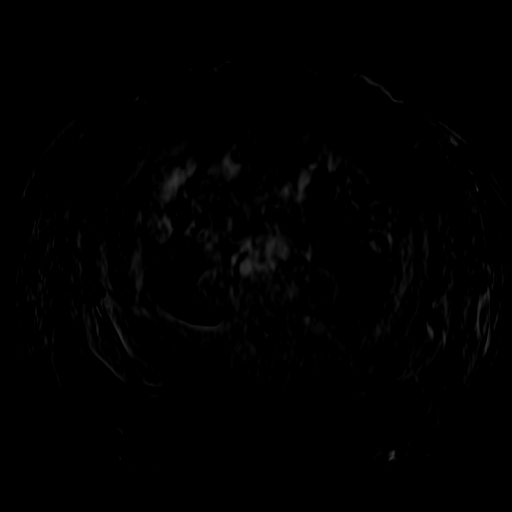
[im 30/60]
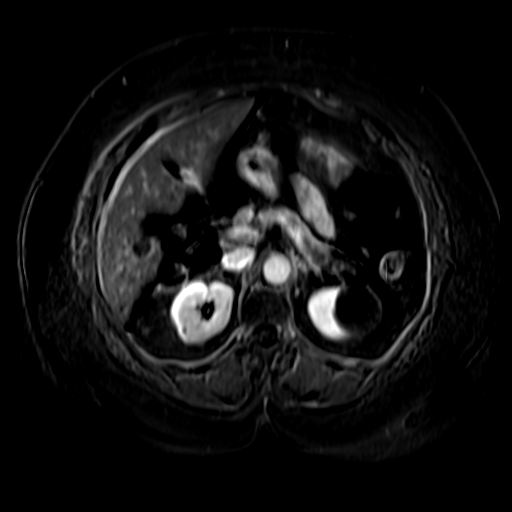
[im 60/60]
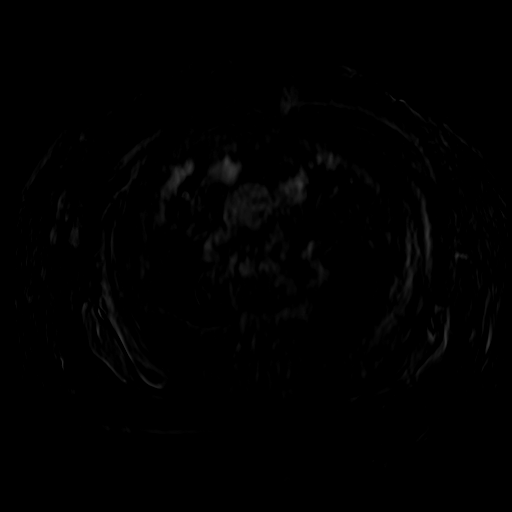

[22 of 48 positions shown; findings below may reference images not displayed]

FINDINGS: Lower chest: Small hiatal hernia.

Hepatobiliary: In segment 6 of the liver there is a well-defined
cm T1 hypointense, T2 hyperintense lesion with multiple thin
internal septations but no significant enhancement, compatible with
a mildly complex hepatic cyst. No other suspicious hepatic lesions.
No intra or extrahepatic biliary ductal dilatation. Several tiny
gallstones lying dependently in the gallbladder. There is also some
amorphous T2 hypointense, T1 hyperintense material lying dependently
in the gallbladder, compatible with biliary sludge. No findings to
suggest cholecystitis at this time.

Pancreas: No pancreatic mass. No pancreatic ductal dilatation. No
pancreatic or peripancreatic fluid or inflammatory changes.

Spleen:  Unremarkable.

Adrenals/Urinary Tract: Multiple T1 hypointense, T2 hyperintense,
nonenhancing lesions in both kidneys measuring up to 4.4 cm in the
upper pole the left kidney, compatible with simple cysts. No
suspicious renal lesions. Bilateral adrenal glands are normal in
appearance. No hydroureteronephrosis in the visualized portions of
the abdomen.

Stomach/Bowel: Visualized portions are unremarkable.

Vascular/Lymphatic: No aneurysm identified in the visualized
abdominal vasculature. No lymphadenopathy noted in the abdomen.

Other: No significant volume of ascites noted in the visualized
portions of the abdomen.

Musculoskeletal: No aggressive appearing osseous lesions are noted
in the visualized portions of the skeleton.
IMPRESSION: 1. No pancreatic mass.
2. Cholelithiasis and biliary sludge in the gallbladder. No findings
to suggest an acute cholecystitis at this time.
3. Additional incidental findings, as above.

## 2018-10-01 ENCOUNTER — Ambulatory Visit (INDEPENDENT_AMBULATORY_CARE_PROVIDER_SITE_OTHER): Payer: Medicare Other | Admitting: Family

## 2018-10-01 ENCOUNTER — Encounter: Payer: Self-pay | Admitting: Family

## 2018-10-01 ENCOUNTER — Other Ambulatory Visit: Payer: Self-pay | Admitting: Family

## 2018-10-01 VITALS — BP 136/75 | HR 73 | Temp 98.4°F | Resp 16 | Ht 63.0 in | Wt 195.0 lb

## 2018-10-01 DIAGNOSIS — Z23 Encounter for immunization: Secondary | ICD-10-CM | POA: Diagnosis not present

## 2018-10-01 DIAGNOSIS — I10 Essential (primary) hypertension: Secondary | ICD-10-CM

## 2018-10-01 DIAGNOSIS — H547 Unspecified visual loss: Secondary | ICD-10-CM | POA: Diagnosis not present

## 2018-10-01 DIAGNOSIS — I2510 Atherosclerotic heart disease of native coronary artery without angina pectoris: Secondary | ICD-10-CM

## 2018-10-01 DIAGNOSIS — H9190 Unspecified hearing loss, unspecified ear: Secondary | ICD-10-CM | POA: Diagnosis not present

## 2018-10-01 DIAGNOSIS — E785 Hyperlipidemia, unspecified: Secondary | ICD-10-CM | POA: Diagnosis not present

## 2018-10-01 DIAGNOSIS — Z0000 Encounter for general adult medical examination without abnormal findings: Secondary | ICD-10-CM | POA: Diagnosis not present

## 2018-10-01 DIAGNOSIS — Z1239 Encounter for other screening for malignant neoplasm of breast: Secondary | ICD-10-CM

## 2018-10-01 DIAGNOSIS — I2584 Coronary atherosclerosis due to calcified coronary lesion: Secondary | ICD-10-CM

## 2018-10-01 DIAGNOSIS — E119 Type 2 diabetes mellitus without complications: Secondary | ICD-10-CM | POA: Diagnosis not present

## 2018-10-01 DIAGNOSIS — Z1231 Encounter for screening mammogram for malignant neoplasm of breast: Secondary | ICD-10-CM

## 2018-10-01 LAB — HEMOGLOBIN A1C: Hgb A1c MFr Bld: 6.2 % (ref 4.6–6.5)

## 2018-10-01 LAB — LIPID PANEL
CHOLESTEROL: 148 mg/dL (ref 0–200)
HDL: 51 mg/dL (ref >39.00)
LDL Cholesterol: 81 mg/dL (ref 0–99)
NonHDL: 97.46
TRIGLYCERIDES: 81 mg/dL (ref 0.0–149.0)
Total CHOL/HDL Ratio: 3
VLDL: 16.2 mg/dL (ref 0.0–40.0)

## 2018-10-01 LAB — COMPREHENSIVE METABOLIC PANEL
ALBUMIN: 4.3 g/dL (ref 3.5–5.2)
ALT: 16 U/L (ref 0–35)
AST: 19 U/L (ref 0–37)
Alkaline Phosphatase: 58 U/L (ref 39–117)
BUN: 30 mg/dL — ABNORMAL HIGH (ref 6–23)
CALCIUM: 9.7 mg/dL (ref 8.4–10.5)
CHLORIDE: 104 meq/L (ref 96–112)
CO2: 24 mEq/L (ref 19–32)
CREATININE: 1.34 mg/dL — AB (ref 0.40–1.20)
GFR: 50.5 mL/min — AB (ref >60.00)
Glucose, Bld: 94 mg/dL (ref 70–99)
Potassium: 4.1 mEq/L (ref 3.5–5.1)
Sodium: 138 mEq/L (ref 135–145)
Total Bilirubin: 0.6 mg/dL (ref 0.2–1.2)
Total Protein: 7.7 g/dL (ref 6.0–8.3)

## 2018-10-01 MED ORDER — ALBUTEROL SULFATE HFA 108 (90 BASE) MCG/ACT IN AERS: 2.0000 | INHALATION_SPRAY | Freq: Four times a day (QID) | RESPIRATORY_TRACT | 5 refills | Status: DC | PRN

## 2018-10-01 MED ORDER — HYDROCHLOROTHIAZIDE 25 MG PO TABS: ORAL_TABLET | ORAL | 1 refills | Status: DC

## 2018-10-01 MED ORDER — BETAMETHASONE DIPROPIONATE 0.05 % EX CREA: TOPICAL_CREAM | Freq: Two times a day (BID) | CUTANEOUS | 1 refills | Status: DC

## 2018-10-01 MED ORDER — ZOSTER VAC RECOMB ADJUVANTED 50 MCG/0.5ML IM SUSR: INTRAMUSCULAR | 1 refills | Status: DC

## 2018-10-01 MED ORDER — ROSUVASTATIN CALCIUM 40 MG PO TABS: ORAL_TABLET | ORAL | 1 refills | Status: DC

## 2018-10-01 MED ORDER — FLUTICASONE FUROATE-VILANTEROL 100-25 MCG/INH IN AEPB: INHALATION_SPRAY | RESPIRATORY_TRACT | 5 refills | Status: DC

## 2018-10-01 MED ORDER — TIOTROPIUM BROMIDE MONOHYDRATE 2.5 MCG/ACT IN AERS: 2.0000 | INHALATION_SPRAY | Freq: Every day | RESPIRATORY_TRACT | 5 refills | Status: DC

## 2018-10-01 NOTE — Patient Instructions (Addendum)
Please complete lab work prior to leaving. Continue healthy diet, exercise and weight loss. Schedule mammogram on the first floor. Restart your inhalers. You should be contacted about your referral to eye doctor and for hearing exam.  Bring Sh[facility] vaccine prescription to [facility] for administration.

## 2018-10-01 NOTE — Progress Notes (Signed)
Subjective:    Suzanne Ali is a 67 y.o. female who presents for Medicare Annual/Subsequent preventive examination.  Preventive Screening-Counseling & Management  Tobacco Social History   Tobacco Use  Smoking Status Never Smoker  Smokeless Tobacco Never Used     Problems Prior to Visit 1.  Hypertension-maintained on hydrochlorothiazide. Denies swelling BP Readings from Last 3 Encounters:  10/01/18 136/75  06/27/18 132/78  12/09/17 138/76    2.  Hyperlipidemia-maintained on Crestor.  Lab Results  Component Value Date   CHOL 136 11/25/2017   HDL 50.80 11/25/2017   LDLCALC 65 11/25/2017   TRIG 97.0 11/25/2017   CHOLHDL 3 11/25/2017   3.  Asthma-this is followed by pulmonology.  She is maintained on Brio Ellipta, Spiriva Respimat, and as needed albuterol. Reports overall well controlled. Notes mild cold symptoms currently.   4. History of stroke-she is maintained on aspirin 81 mg daily.  5. DM2-maintained on diet alone. Lab Results  Component Value Date   HGBA1C 5.9 (H) 06/27/2018   HGBA1C 6.3 11/25/2017   HGBA1C 6.6 (H) 08/26/2017   Lab Results  Component Value Date   MICROALBUR 4.5 (H) 10/24/2016   LDLCALC 65 11/25/2017   CREATININE 1.37 (H) 06/27/2018    Immunizations: Due for Shingrix Diet: trying to eat healthy Wt Readings from Last 3 Encounters:  10/01/18 195 lb (88.5 kg)  06/27/18 205 lb (93 kg)  12/09/17 218 lb (98.9 kg)    Exercise:  Goes to the GYM 4 times a week Colonoscopy: Completed June 14, 2017.  Five-year follow-up recommended. Dexa: April 24, 2016-normal for her age. Pap Smear: N/A Mammogram: Last mammogram 10/05/2017. Vision: some issues seeing at night Dental:  due     Visual Acuity Screening   Right eye Left eye Both eyes  Without correction: 20/40 20/40 20/25   With correction:             Assessment & Plan:     Current Problems (verified) Patient Active Problem List   Diagnosis Date Noted  . Diabetes mellitus type  2, controlled, without complications ([place]) 51/76/1607  . Multiple lung nodules on CT 10/27/2015  . Syncope 10/18/2015  . Reactive airways dysfunction syndrome, moderate persistent, uncomplicated ([place]) 37/08/6268  . Mediastinal adenopathy 10/06/2015  . Reactive airway disease 10/05/2015  . History of CVA (cerebrovascular accident) 10/05/2015  . Contact dermatitis 10/05/2015  . Vision problem 05/20/2014  . Carpal tunnel syndrome 01/18/2014  . Weight gain 10/27/2013  . Asthma, chronic 12/11/2012  . Hyperlipidemia 08/25/2012  . Dysphagia 05/22/2012  . Hypertension 05/14/2012    Medications Prior to Visit Current Outpatient Medications on File Prior to Visit  Medication Sig Dispense Refill  . acetaminophen (TYLENOL) 325 MG tablet Take 650 mg by mouth every 6 (six) hours as needed for mild pain, moderate pain or headache.    . albuterol (PROAIR HFA) 108 (90 Base) MCG/ACT inhaler Inhale 2 puffs into the lungs every 6 (six) hours as needed for wheezing or shortness of breath. 8.5 g 5  . aspirin EC 81 MG tablet Take 1 tablet (81 mg total) by mouth daily.    . betamethasone dipropionate (DIPROLENE) 0.05 % cream Apply topically 2 (two) times daily. 45 g 1  . BREO ELLIPTA 100-25 MCG/INH AEPB INHALE 1 PUFF BY MOUTH INTO THE LUNGS DAILY. 60 each 5  . hydrochlorothiazide (HYDRODIURIL) 25 MG tablet TAKE 1 TABLET (25 MG TOTAL) BY MOUTH DAILY. 90 tablet 1  . rosuvastatin (CRESTOR) 40 MG tablet TAKE 1 TABLET (40  MG TOTAL) BY MOUTH DAILY. *PT NEED APPT FOR FURTHER REFILLS* 90 tablet 1  . Tiotropium Bromide Monohydrate (SPIRIVA RESPIMAT) 2.5 MCG/ACT AERS Inhale 2 puffs into the lungs daily. 1 Inhaler 5  . triamcinolone cream (KENALOG) 0.1 % APPLY 1 APPLICATION TOPICALLY 2 (TWO) TIMES DAILY. 80 g 1   Current Facility-Administered Medications on File Prior to Visit  Medication Dose Route Frequency Provider Last Rate Last Dose  . 0.9 %  sodium chloride infusion  500 mL Intravenous Continuous Lucio Edward  T, MD      . 0.9 %  sodium chloride infusion  500 mL Intravenous Continuous Ladene Artist, MD        Current Medications (verified) Current Outpatient Medications  Medication Sig Dispense Refill  . acetaminophen (TYLENOL) 325 MG tablet Take 650 mg by mouth every 6 (six) hours as needed for mild pain, moderate pain or headache.    . albuterol (PROAIR HFA) 108 (90 Base) MCG/ACT inhaler Inhale 2 puffs into the lungs every 6 (six) hours as needed for wheezing or shortness of breath. 8.5 g 5  . aspirin EC 81 MG tablet Take 1 tablet (81 mg total) by mouth daily.    . betamethasone dipropionate (DIPROLENE) 0.05 % cream Apply topically 2 (two) times daily. 45 g 1  . BREO ELLIPTA 100-25 MCG/INH AEPB INHALE 1 PUFF BY MOUTH INTO THE LUNGS DAILY. 60 each 5  . hydrochlorothiazide (HYDRODIURIL) 25 MG tablet TAKE 1 TABLET (25 MG TOTAL) BY MOUTH DAILY. 90 tablet 1  . rosuvastatin (CRESTOR) 40 MG tablet TAKE 1 TABLET (40 MG TOTAL) BY MOUTH DAILY. *PT NEED APPT FOR FURTHER REFILLS* 90 tablet 1  . Tiotropium Bromide Monohydrate (SPIRIVA RESPIMAT) 2.5 MCG/ACT AERS Inhale 2 puffs into the lungs daily. 1 Inhaler 5  . triamcinolone cream (KENALOG) 0.1 % APPLY 1 APPLICATION TOPICALLY 2 (TWO) TIMES DAILY. 80 g 1   Current Facility-Administered Medications  Medication Dose Route Frequency Provider Last Rate Last Dose  . 0.9 %  sodium chloride infusion  500 mL Intravenous Continuous Lucio Edward T, MD      . 0.9 %  sodium chloride infusion  500 mL Intravenous Continuous Ladene Artist, MD         Allergies (verified) Lisinopril; Benicar [olmesartan]; Latex; Penicillins; and Sulfa antibiotics   PAST HISTORY  Family History Family History  Problem Relation Age of Onset  . Heart disease Mother   . Hypertension Mother   . Cancer Father   . Heart disease Father   . CAD Brother   . Colon cancer Neg Hx   . Colon polyps Neg Hx   . Diabetes Neg Hx   . Stomach cancer Neg Hx   . Rectal cancer Neg Hx      Social History Social History   Tobacco Use  . Smoking status: Never Smoker  . Smokeless tobacco: Never Used  Substance Use Topics  . Alcohol use: No     Are there smokers in your home (other than you)? No  Risk Factors Current exercise habits: gym 4 x a week  Dietary issues discussed: healthy   Cardiac risk factors: advanced age (older than 42 for men, 36 for women).Diabetes, HTN, hyperlipidemia, hx of cva  Depression Screen (Note: if answer to either of the following is "Yes", a more complete depression screening is indicated)   Over the past two weeks, have you felt down, depressed or hopeless? No  Over the past two weeks, have you felt little interest or  pleasure in doing things? No  Have you lost interest or pleasure in daily life? No  Do you often feel hopeless? No  Do you cry easily over simple problems? No  Activities of Daily Living In your present state of health, do you have any difficulty performing the following activities?:  Driving? No Managing money?  No Feeding yourself? No Getting from bed to chair? No   Climbing a flight of stairs? No Preparing food and eating?: No Bathing or showering? No Getting dressed: No Getting to the toilet? No Using the toilet:No Moving around from place to place: No In the past year have you fallen or had a near fall?:No   Are you sexually active?  Yes  Do you have more than one partner?  No  Hearing Difficulties: No Do you often ask people to speak up or repeat themselves? Yes Do you experience ringing or noises in your ears? No Do you have difficulty understanding soft or whispered voices? No   Do you feel that you have a problem with memory? No  Do you often misplace items? No  Do you feel safe at home?  Yes  Cognitive Testing  Alert? Yes  Normal Appearance?Yes  Oriented to person? Yes  Place? Yes   Time? Yes  Recall of three objects?  Yes  Can perform simple calculations? Yes  Displays appropriate  judgment?Yes  Can read the correct time from a watch face?Yes   Advanced Directives have been discussed with the patient? No  List the Names of Other Physician/Practitioners you currently use: 1.    Indicate any recent Medical Services you may have received from other than [facility] providers in the past year (date may be approximate).  Immunization History  Administered Date(s) Administered  . Influenza, High Dose Seasonal PF 07/23/2016, 08/26/2017  . Influenza,inj,Quad PF,6+ Mos 10/23/2013, 09/20/2014, 07/25/2015  . Pneumococcal Conjugate-13 07/25/2015  . Pneumococcal Polysaccharide-23 01/23/2017  . Tdap 05/03/2012    Screening Tests Health Maintenance  Topic Date Due  . Hepatitis C Screening  Jan 06, 1951  . URINE MICROALBUMIN  10/24/2017  . OPHTHALMOLOGY EXAM  11/12/2017  . INFLUENZA VACCINE  06/12/2018  . FOOT EXAM  11/25/2018  . HEMOGLOBIN A1C  12/28/2018  . MAMMOGRAM  10/06/2019  . TETANUS/TDAP  05/03/2022  . COLONOSCOPY  06/14/2022  . DEXA SCAN  Completed  . PNA vac Low Risk Adult  Completed    All answers were reviewed with the patient and necessary referrals were made:  [name], NP   10/01/2018   History reviewed: allergies, current medications, past family history, past medical history, past social history, past surgical history and problem list  Review of Systems  See HPI   Objective:  Body mass index is 34.54 kg/m. BP 136/75 (BP Location: Left Arm, Patient Position: Sitting, Cuff Size: Small)   Pulse 73   Temp 98.4 F (36.9 C) (Oral)   Resp 16   Ht 5\' 3"  (1.6 m)   Wt 195 lb (88.5 kg)   SpO2 100%   BMI 34.54 kg/m  Physical Exam  Constitutional: She is oriented to person, place, and time. She appears well-developed and well-nourished. No distress.  HENT:  Head: Normocephalic and atraumatic.  Right Ear: Tympanic membrane and ear canal normal.  Left Ear: Tympanic membrane and ear canal normal.  Mouth/Throat: Oropharynx is clear and  moist.  Eyes: Pupils are equal, round, and reactive to light. No scleral icterus.  Neck: Normal range of motion. No thyromegaly present.  Cardiovascular:  Normal rate and regular rhythm.   No murmur heard. Pulmonary/Chest: No respiratory distress. He has a soft right-sided expiratory wheeze.Marland Kitchen She has no rales. She exhibits no tenderness.  Abdominal: Soft. Bowel sounds are normal. She exhibits no distension and no mass. There is no tenderness. There is no rebound and no guarding.  Musculoskeletal: She exhibits no edema.  Lymphadenopathy:    She has no cervical adenopathy.  Neurological: She is alert and oriented to person, place, and time. She has normal patellar reflexes. She exhibits normal muscle tone. Coordination normal. mild speech hesitency Skin: Skin is warm and dry. some dermatitis on forearms- overall improved. Psychiatric: She has a normal mood and affect. Her behavior is normal. Judgment and thought content normal.  Breasts: Examined lying Right: Without masses, retractions, discharge or axillary adenopathy.  Left: Without masses, retractions, discharge or axillary adenopathy.  Pelvic: deferred     Assessment & Plan:     Assessment:     Hypertension-blood pressure stable on current medication.  Continue same.  Will obtain follow-up complete metabolic panel.  Hyperlipidemia-tolerating statin- obtain follow-up lipid panel.  History of CVA-continue his aspirin.  She has lost a significant amount of weight I commended her on this.  Encouraged continued healthy diet exercise and weight loss.  Diabetes type 2-has been well controlled.  Will obtain follow-up A1c.    Asthma-soft wheeze today.  Refill sent on all of her inhalers.   Plan:     During the course of the visit the patient was educated and counseled about appropriate screening and preventive services including:    Influenza vaccine  Advanced directives: Patient was given a copy of the [place] advance  directives form.  Encouraged to complete an get it notarized.  Requested copy for chart after completion.  .  Diet review for nutrition referral? Yes ____  Not Indicated _x___   Patient Instructions (the written plan) was given to the patient.  Medicare Attestation I have personally reviewed: The patient's medical and social history Their use of alcohol, tobacco or illicit drugs Their current medications and supplements The patient's functional ability including ADLs,fall risks, home safety risks, cognitive, and hearing and visual impairment Diet and physical activities Evidence for depression or mood disorders  The patient's weight, height, BMI, and visual acuity have been recorded in the chart.  I have made referrals, counseling, and provided education to the patient based on review of the above and I have provided the patient with a written personalized care plan for preventive services.     [name], NP   10/01/2018      Patient ID: Baird Lyons, female   DOB: Nov 14, 1950, 67 y.o.   MRN: [id number]

## 2018-10-02 ENCOUNTER — Encounter: Payer: Self-pay | Admitting: Family

## 2018-10-13 ENCOUNTER — Inpatient Hospital Stay (HOSPITAL_BASED_OUTPATIENT_CLINIC_OR_DEPARTMENT_OTHER): Admission: RE | Admit: 2018-10-13 | Payer: Medicare Other | Source: Ambulatory Visit

## 2018-10-14 ENCOUNTER — Ambulatory Visit: Payer: Medicare Other | Admitting: *Deleted

## 2018-10-14 ENCOUNTER — Telehealth: Payer: Self-pay | Admitting: *Deleted

## 2018-10-14 NOTE — Telephone Encounter (Signed)
Patient cancelled appointment and will call Audiology back in January 2020, states she was not ready now; forwarded to provider/SLS 12/03

## 2018-10-24 ENCOUNTER — Other Ambulatory Visit: Payer: Self-pay | Admitting: *Deleted

## 2018-10-24 DIAGNOSIS — R918 Other nonspecific abnormal finding of lung field: Secondary | ICD-10-CM

## 2018-10-24 DIAGNOSIS — R911 Solitary pulmonary nodule: Secondary | ICD-10-CM

## 2018-10-24 NOTE — Progress Notes (Signed)
New CT order needing to be placed due to pt needing to reschedule CT.order placed. Nothing further needed.

## 2018-10-27 ENCOUNTER — Inpatient Hospital Stay: Admission: RE | Admit: 2018-10-27 | Payer: PPO | Source: Ambulatory Visit

## 2018-11-16 ENCOUNTER — Other Ambulatory Visit: Payer: Self-pay

## 2018-11-16 ENCOUNTER — Emergency Department (HOSPITAL_BASED_OUTPATIENT_CLINIC_OR_DEPARTMENT_OTHER)
Admission: EM | Admit: 2018-11-16 | Discharge: 2018-11-16 | Disposition: A | Discharge status: Home / Self Care | Payer: Medicare Other | Attending: Emergency Medicine | Admitting: Emergency Medicine

## 2018-11-16 ENCOUNTER — Encounter (HOSPITAL_BASED_OUTPATIENT_CLINIC_OR_DEPARTMENT_OTHER): Payer: Self-pay | Admitting: Emergency Medicine

## 2018-11-16 DIAGNOSIS — R42 Dizziness and giddiness: Secondary | ICD-10-CM | POA: Insufficient documentation

## 2018-11-16 DIAGNOSIS — Z8673 Personal history of transient ischemic attack (TIA), and cerebral infarction without residual deficits: Secondary | ICD-10-CM | POA: Insufficient documentation

## 2018-11-16 DIAGNOSIS — J45909 Unspecified asthma, uncomplicated: Secondary | ICD-10-CM | POA: Insufficient documentation

## 2018-11-16 DIAGNOSIS — I10 Essential (primary) hypertension: Secondary | ICD-10-CM | POA: Insufficient documentation

## 2018-11-16 DIAGNOSIS — Z760 Encounter for issue of repeat prescription: Secondary | ICD-10-CM | POA: Diagnosis not present

## 2018-11-16 DIAGNOSIS — E119 Type 2 diabetes mellitus without complications: Secondary | ICD-10-CM | POA: Diagnosis not present

## 2018-11-16 DIAGNOSIS — Z91040 Latex allergy status: Secondary | ICD-10-CM | POA: Diagnosis not present

## 2018-11-16 LAB — CBC WITH DIFFERENTIAL/PLATELET
Abs Immature Granulocytes: 0.02 10*3/uL (ref 0.00–0.07)
Basophils Absolute: 0.1 10*3/uL (ref 0.0–0.1)
Basophils Relative: 1 %
Eosinophils Absolute: 0.8 10*3/uL — ABNORMAL HIGH (ref 0.0–0.5)
Eosinophils Relative: 9 %
HCT: 44.6 % (ref 36.0–46.0)
Hemoglobin: 13.8 g/dL (ref 12.0–15.0)
Immature Granulocytes: 0 %
Lymphocytes Relative: 30 %
Lymphs Abs: 2.7 10*3/uL (ref 0.7–4.0)
MCH: 26.2 pg (ref 26.0–34.0)
MCHC: 30.9 g/dL (ref 30.0–36.0)
MCV: 84.8 fL (ref 80.0–100.0)
Monocytes Absolute: 0.8 10*3/uL (ref 0.1–1.0)
Monocytes Relative: 9 %
Neutro Abs: 4.6 10*3/uL (ref 1.7–7.7)
Neutrophils Relative %: 51 %
Platelets: 207 10*3/uL (ref 150–400)
RBC: 5.26 MIL/uL — ABNORMAL HIGH (ref 3.87–5.11)
RDW: 16.7 % — ABNORMAL HIGH (ref 11.5–15.5)
WBC: 8.9 10*3/uL (ref 4.0–10.5)
nRBC: 0 % (ref 0.0–0.2)

## 2018-11-16 LAB — COMPREHENSIVE METABOLIC PANEL
ALK PHOS: 54 U/L (ref 38–126)
ALT: 15 U/L (ref 0–44)
AST: 22 U/L (ref 15–41)
Albumin: 4 g/dL (ref 3.5–5.0)
Anion gap: 7 (ref 5–15)
BILIRUBIN TOTAL: 0.7 mg/dL (ref 0.3–1.2)
BUN: 24 mg/dL — AB (ref 8–23)
CALCIUM: 9.2 mg/dL (ref 8.9–10.3)
CO2: 23 mmol/L (ref 22–32)
Chloride: 108 mmol/L (ref 98–111)
Creatinine, Ser: 0.98 mg/dL (ref 0.44–1.00)
GFR calc Af Amer: >60 mL/min (ref >60)
GFR, EST NON AFRICAN AMERICAN: 59 mL/min — AB (ref >60)
Glucose, Bld: 89 mg/dL (ref 70–99)
Potassium: 3.8 mmol/L (ref 3.5–5.1)
Sodium: 138 mmol/L (ref 135–145)
TOTAL PROTEIN: 7.7 g/dL (ref 6.5–8.1)

## 2018-11-16 LAB — URINALYSIS, ROUTINE W REFLEX MICROSCOPIC
Bilirubin Urine: NEGATIVE
Glucose, UA: NEGATIVE mg/dL
Hgb urine dipstick: NEGATIVE
Ketones, ur: NEGATIVE mg/dL
Leukocytes, UA: NEGATIVE
Nitrite: NEGATIVE
Protein, ur: NEGATIVE mg/dL
Specific Gravity, Urine: 1.025 (ref 1.005–1.030)
pH: 5 (ref 5.0–8.0)

## 2018-11-16 LAB — TROPONIN I: Troponin I: <0.03 ng/mL (ref <0.03)

## 2018-11-16 MED ORDER — HYDROCHLOROTHIAZIDE 25 MG PO TABS
25.0000 mg | ORAL_TABLET | Freq: Once | ORAL | Status: AC
  Administered 2018-11-16: 25 mg via ORAL
  Filled 2018-11-16: qty 1

## 2018-11-16 MED ORDER — HYDROCHLOROTHIAZIDE 25 MG PO TABS: ORAL_TABLET | ORAL | 0 refills | Status: DC

## 2018-11-16 MED ORDER — ROSUVASTATIN CALCIUM 40 MG PO TABS: ORAL_TABLET | ORAL | 0 refills | Status: DC

## 2018-11-16 MED ORDER — ALBUTEROL SULFATE HFA 108 (90 BASE) MCG/ACT IN AERS: 2.0000 | INHALATION_SPRAY | Freq: Four times a day (QID) | RESPIRATORY_TRACT | 0 refills | Status: DC | PRN

## 2018-11-16 NOTE — ED Triage Notes (Signed)
Pt reports not having meds for over a month and currently has dizziness with high BP.
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
## 2018-11-16 NOTE — Discharge Instructions (Signed)

## 2018-11-17 MED FILL — ROSUVASTATIN CALCIUM 40 MG: 40 | 30 days supply | Qty: 30 | Fill #0

## 2018-11-17 MED FILL — SPIRIVA RESPIMAT INHAL SPRY: 2.5 | 30 days supply | Qty: 4 | Fill #0

## 2018-11-17 MED FILL — HYDROCHLOROTHIAZIDE 25 MG T: 25 | 30 days supply | Qty: 30 | Fill #0

## 2018-11-17 MED FILL — PROAIR HFA 90 MCG INHALER: 108 (90 BAS | 25 days supply | Qty: 9 | Fill #0

## 2018-11-18 ENCOUNTER — Encounter: Payer: Self-pay | Admitting: Family

## 2018-11-18 ENCOUNTER — Other Ambulatory Visit: Payer: Medicare Other

## 2018-11-18 ENCOUNTER — Ambulatory Visit (INDEPENDENT_AMBULATORY_CARE_PROVIDER_SITE_OTHER): Payer: Medicare Other | Admitting: Family

## 2018-11-18 VITALS — BP 137/74 | HR 70 | Temp 98.5°F | Resp 16 | Ht 65.0 in | Wt 195.0 lb

## 2018-11-18 DIAGNOSIS — J45909 Unspecified asthma, uncomplicated: Secondary | ICD-10-CM

## 2018-11-18 DIAGNOSIS — I10 Essential (primary) hypertension: Secondary | ICD-10-CM

## 2018-11-18 MED ORDER — HYDROCHLOROTHIAZIDE 25 MG PO TABS: ORAL_TABLET | ORAL | 5 refills | Status: DC

## 2018-11-18 MED FILL — BREO ELLIPTA 100-25 MCG INH: 100-25 | 30 days supply | Qty: 60 | Fill #0
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
## 2018-11-18 NOTE — Patient Instructions (Signed)
Please restart your inhalers. Follow up as scheduled in May-sooner if problems/concerns.

## 2018-11-25 ENCOUNTER — Ambulatory Visit (INDEPENDENT_AMBULATORY_CARE_PROVIDER_SITE_OTHER)
Admission: RE | Admit: 2018-11-25 | Discharge: 2018-11-25 | Disposition: A | Discharge status: Home / Self Care | Payer: Medicare Other | Source: Ambulatory Visit | Attending: Internal Medicine | Admitting: Internal Medicine

## 2018-11-25 DIAGNOSIS — R918 Other nonspecific abnormal finding of lung field: Secondary | ICD-10-CM | POA: Diagnosis not present

## 2018-12-15 MED FILL — HYDROCHLOROTHIAZIDE 25 MG T: 25 | 90 days supply | Qty: 90 | Fill #0

## 2018-12-15 MED FILL — BREO ELLIPTA 100-25 MCG INH: 100-25 | 30 days supply | Qty: 60 | Fill #1

## 2018-12-15 MED FILL — SPIRIVA RESPIMAT INHAL SPRY: 2.5 | 30 days supply | Qty: 4 | Fill #1

## 2018-12-15 MED FILL — ROSUVASTATIN CALCIUM 40 MG: 40 | 90 days supply | Qty: 90 | Fill #0

## 2019-02-03 MED FILL — SPIRIVA RESPIMAT 2.5 MCG IN: 2.5 | 30 days supply | Qty: 4 | Fill #2

## 2019-02-03 MED FILL — BREO ELLIPTA 100-25 MCG INH: 100-25 | 30 days supply | Qty: 60 | Fill #2

## 2019-03-09 MED FILL — BREO ELLIPTA 100-25 MCG INH: 100-25 | 30 days supply | Qty: 60 | Fill #3

## 2019-03-09 MED FILL — SPIRIVA RESPIMAT 2.5 MCG IN: 2.5 | 30 days supply | Qty: 4 | Fill #3

## 2019-03-13 MED FILL — HYDROCHLOROTHIAZIDE 25 MG T: 25 | 90 days supply | Qty: 90 | Fill #1

## 2019-03-13 MED FILL — ROSUVASTATIN CALCIUM 40 MG: 40 | 90 days supply | Qty: 90 | Fill #1

## 2019-04-07 ENCOUNTER — Ambulatory Visit (INDEPENDENT_AMBULATORY_CARE_PROVIDER_SITE_OTHER): Payer: Medicare Other | Admitting: Family

## 2019-04-07 DIAGNOSIS — L240 Irritant contact dermatitis due to detergents: Secondary | ICD-10-CM | POA: Diagnosis not present

## 2019-04-07 DIAGNOSIS — E119 Type 2 diabetes mellitus without complications: Secondary | ICD-10-CM

## 2019-04-07 DIAGNOSIS — E785 Hyperlipidemia, unspecified: Secondary | ICD-10-CM

## 2019-04-07 DIAGNOSIS — J45909 Unspecified asthma, uncomplicated: Secondary | ICD-10-CM

## 2019-04-07 DIAGNOSIS — Z8673 Personal history of transient ischemic attack (TIA), and cerebral infarction without residual deficits: Secondary | ICD-10-CM | POA: Diagnosis not present

## 2019-04-07 DIAGNOSIS — I10 Essential (primary) hypertension: Secondary | ICD-10-CM | POA: Diagnosis not present

## 2019-04-07 MED ORDER — ROSUVASTATIN CALCIUM 40 MG PO TABS: ORAL_TABLET | ORAL | 0 refills | Status: DC

## 2019-04-07 MED ORDER — ALBUTEROL SULFATE HFA 108 (90 BASE) MCG/ACT IN AERS: 2.0000 | INHALATION_SPRAY | Freq: Four times a day (QID) | RESPIRATORY_TRACT | 5 refills | Status: DC | PRN

## 2019-04-07 MED ORDER — FLUTICASONE FUROATE-VILANTEROL 100-25 MCG/INH IN AEPB: INHALATION_SPRAY | RESPIRATORY_TRACT | 5 refills | Status: DC

## 2019-04-07 MED ORDER — TIOTROPIUM BROMIDE MONOHYDRATE 2.5 MCG/ACT IN AERS: 2.0000 | INHALATION_SPRAY | Freq: Every day | RESPIRATORY_TRACT | 5 refills | Status: DC

## 2019-04-07 MED ORDER — BETAMETHASONE DIPROPIONATE 0.05 % EX CREA: TOPICAL_CREAM | Freq: Two times a day (BID) | CUTANEOUS | 1 refills | Status: DC

## 2019-04-07 MED ORDER — HYDROCHLOROTHIAZIDE 25 MG PO TABS: ORAL_TABLET | ORAL | 5 refills | Status: DC

## 2019-04-07 MED FILL — BETAMETHASONE DP 0.05% CRM: 0.05 | 30 days supply | Qty: 45 | Fill #0

## 2019-04-07 NOTE — Progress Notes (Signed)
Virtual Visit via Telephone Note  I connected with Suzanne Ali Cousin on 04/07/19 at  9:00 AM EDT by telephone and verified that I am speaking with the correct person using two identifiers. We were unable to complete a video visit because she does not have a smart phone.   Location: Patient: home Provider: office   I discussed the limitations, risks, security and privacy concerns of performing an evaluation and management service by telephone and the availability of in person appointments. I also discussed with the patient that there may be a patient responsible charge related to this service. The patient expressed understanding and agreed to proceed.   History of Present Illness:  Asthma- reports that she continues spirirva and prn spiriva.  Hx of stroke- continues aspirin 81mg .   Denies myalgia.   Lab Results  Component Value Date   CHOL 148 10/01/2018   HDL 51.00 10/01/2018   LDLCALC 81 10/01/2018   TRIG 81.0 10/01/2018   CHOLHDL 3 10/01/2018   HTN-  132/88 was most recent bp. Did not check today. Continues hctz. Has not weighed herself recently.    BP Readings from Last 3 Encounters:  11/18/18 137/74  11/16/18 (!) 145/77  10/01/18 136/75   Skin rash- reports that she had to do some dishes last week at work and the developed itchy rash on her hands.     Observations/Objective:   Gen: Awake, alert, no acute distress Resp: Breathing sounds even and non-labored Psych: calm/pleasant demeanor Neuro: Alert and Oriented x 3,  speech is dysarthric (at baseline)  Assessment and Plan:  Contact dermatitis- rx with topical steroid cream. reminded pt to use gloves when washing dishes. She is very sensitive to the detergents. She states that she has been unable to find any gloves since the Pandemic started. Advised pt to see if her daughter can check on [place] for her.  Hyperlipidemia- LDL at goal, continue statin.   HTN- reports stable blood pressures at home.  Hx of cva-  continue aspirin/statin for secondary prevention.  Asthma- stable, continue current meds.  DM2-  Lab Results  Component Value Date   HGBA1C 6.2 10/01/2018   HGBA1C 5.9 (H) 06/27/2018   HGBA1C 6.3 11/25/2017   Lab Results  Component Value Date   MICROALBUR 4.5 (H) 10/24/2016   LDLCALC 81 10/01/2018   CREATININE 0.98 11/16/2018     Follow Up Instructions:  Plan follow up in 3 months for a face to face visit. Will update labs at that time.  I discussed the assessment and treatment plan with the patient. The patient was provided an opportunity to ask questions and all were answered. The patient agreed with the plan and demonstrated an understanding of the instructions.   The patient was advised to call back or seek an in-person evaluation if the symptoms worsen or if the condition fails to improve as anticipated.  I provided 11 minutes of non-face-to-face time during this encounter.   [name], NP

## 2019-04-11 HISTORY — PX: WRIST SURGERY: SHX841

## 2019-04-19 DIAGNOSIS — S52502A Unspecified fracture of the lower end of left radius, initial encounter for closed fracture: Secondary | ICD-10-CM | POA: Diagnosis not present

## 2019-04-19 DIAGNOSIS — W19XXXA Unspecified fall, initial encounter: Secondary | ICD-10-CM | POA: Diagnosis not present

## 2019-04-19 DIAGNOSIS — E1121 Type 2 diabetes mellitus with diabetic nephropathy: Secondary | ICD-10-CM | POA: Diagnosis not present

## 2019-04-19 DIAGNOSIS — S62109B Fracture of unspecified carpal bone, unspecified wrist, initial encounter for open fracture: Secondary | ICD-10-CM | POA: Diagnosis not present

## 2019-04-19 DIAGNOSIS — I10 Essential (primary) hypertension: Secondary | ICD-10-CM | POA: Diagnosis not present

## 2019-04-19 DIAGNOSIS — J984 Other disorders of lung: Secondary | ICD-10-CM | POA: Diagnosis not present

## 2019-04-19 DIAGNOSIS — S52692A Other fracture of lower end of left ulna, initial encounter for closed fracture: Secondary | ICD-10-CM | POA: Diagnosis not present

## 2019-04-19 DIAGNOSIS — S52602B Unspecified fracture of lower end of left ulna, initial encounter for open fracture type I or II: Secondary | ICD-10-CM | POA: Diagnosis not present

## 2019-04-19 DIAGNOSIS — S59902A Unspecified injury of left elbow, initial encounter: Secondary | ICD-10-CM | POA: Diagnosis not present

## 2019-04-19 DIAGNOSIS — S52602A Unspecified fracture of lower end of left ulna, initial encounter for closed fracture: Secondary | ICD-10-CM | POA: Diagnosis not present

## 2019-04-19 DIAGNOSIS — M25522 Pain in left elbow: Secondary | ICD-10-CM | POA: Diagnosis not present

## 2019-04-19 DIAGNOSIS — S52592A Other fractures of lower end of left radius, initial encounter for closed fracture: Secondary | ICD-10-CM | POA: Diagnosis not present

## 2019-04-19 DIAGNOSIS — S52502B Unspecified fracture of the lower end of left radius, initial encounter for open fracture type I or II: Secondary | ICD-10-CM | POA: Diagnosis not present

## 2019-04-19 DIAGNOSIS — E785 Hyperlipidemia, unspecified: Secondary | ICD-10-CM | POA: Diagnosis not present

## 2019-04-20 DIAGNOSIS — S52602A Unspecified fracture of lower end of left ulna, initial encounter for closed fracture: Secondary | ICD-10-CM | POA: Diagnosis not present

## 2019-04-20 DIAGNOSIS — S62109B Fracture of unspecified carpal bone, unspecified wrist, initial encounter for open fracture: Secondary | ICD-10-CM | POA: Diagnosis not present

## 2019-04-20 DIAGNOSIS — E785 Hyperlipidemia, unspecified: Secondary | ICD-10-CM | POA: Diagnosis not present

## 2019-04-20 DIAGNOSIS — S52692B Other fracture of lower end of left ulna, initial encounter for open fracture type I or II: Secondary | ICD-10-CM | POA: Diagnosis not present

## 2019-04-20 DIAGNOSIS — S52502A Unspecified fracture of the lower end of left radius, initial encounter for closed fracture: Secondary | ICD-10-CM | POA: Diagnosis not present

## 2019-04-20 DIAGNOSIS — M25532 Pain in left wrist: Secondary | ICD-10-CM | POA: Diagnosis not present

## 2019-04-20 DIAGNOSIS — S52602B Unspecified fracture of lower end of left ulna, initial encounter for open fracture type I or II: Secondary | ICD-10-CM | POA: Diagnosis not present

## 2019-04-20 DIAGNOSIS — G8918 Other acute postprocedural pain: Secondary | ICD-10-CM | POA: Diagnosis not present

## 2019-04-20 DIAGNOSIS — S52502B Unspecified fracture of the lower end of left radius, initial encounter for open fracture type I or II: Secondary | ICD-10-CM | POA: Diagnosis not present

## 2019-04-20 DIAGNOSIS — W010XXA Fall on same level from slipping, tripping and stumbling without subsequent striking against object, initial encounter: Secondary | ICD-10-CM | POA: Diagnosis not present

## 2019-04-20 DIAGNOSIS — E1121 Type 2 diabetes mellitus with diabetic nephropathy: Secondary | ICD-10-CM | POA: Diagnosis not present

## 2019-04-20 DIAGNOSIS — I10 Essential (primary) hypertension: Secondary | ICD-10-CM | POA: Diagnosis not present

## 2019-04-21 DIAGNOSIS — E1121 Type 2 diabetes mellitus with diabetic nephropathy: Secondary | ICD-10-CM | POA: Diagnosis not present

## 2019-04-21 DIAGNOSIS — I10 Essential (primary) hypertension: Secondary | ICD-10-CM | POA: Diagnosis not present

## 2019-04-21 DIAGNOSIS — S62109B Fracture of unspecified carpal bone, unspecified wrist, initial encounter for open fracture: Secondary | ICD-10-CM | POA: Diagnosis not present

## 2019-04-21 DIAGNOSIS — E785 Hyperlipidemia, unspecified: Secondary | ICD-10-CM | POA: Diagnosis not present

## 2019-04-22 ENCOUNTER — Telehealth: Payer: Self-pay | Admitting: Family

## 2019-04-22 MED FILL — BREO ELLIPTA 100-25 MCG INH: 100-25 | 30 days supply | Qty: 60 | Fill #4

## 2019-04-22 MED FILL — OXYCODONE-ACETAMINOPHEN 5-3: 5-325 | 5 days supply | Qty: 30 | Fill #0

## 2019-04-22 MED FILL — VENTOLIN HFA 90 MCG INHALER: 108 (90 BAS | 25 days supply | Qty: 18 | Fill #0

## 2019-04-22 MED FILL — SPIRIVA RESPIMAT 2.5 MCG IN: 2.5 | 30 days supply | Qty: 4 | Fill #4

## 2019-04-22 NOTE — Telephone Encounter (Signed)
Copied from [place] [id number]. Topic: General - Other >> Apr 22, 2019  8:58 AM Pauline Good wrote: Reason for CRM: pt's daughter Raquel is calling and stating pt was out of town and fell and broke her wrist, pt need her medications but daughter didn't know which ones she need and want to talk to nurse to discuss   ------------------------------------------------------------- I spoke with patient's daughter Raquel. Pt slipped and fell on Sunday- was in [facility] when it happened. She was released from the hospital yesterday. Daughter reports that the patient is in a soft cast and has hardware. Follow up with ortho is scheduled in [place].  She will go to patient's apartment and get her meds as it will be too costly to refill early.

## 2019-05-05 DIAGNOSIS — S52502E Unspecified fracture of the lower end of left radius, subsequent encounter for open fracture type I or II with routine healing: Secondary | ICD-10-CM | POA: Diagnosis not present

## 2019-05-05 DIAGNOSIS — S52602E Unspecified fracture of lower end of left ulna, subsequent encounter for open fracture type I or II with routine healing: Secondary | ICD-10-CM | POA: Diagnosis not present

## 2019-05-13 ENCOUNTER — Telehealth: Payer: Self-pay

## 2019-05-13 NOTE — Telephone Encounter (Signed)
Copied from [place] [id number]. Topic: Referral - Request for Referral >> May 13, 2019  8:22 AM Virl Axe D wrote: Has patient seen PCP for this complaint? No *If NO, is insurance requiring patient see PCP for this issue before PCP can refer them? Referral for which specialty: Orthopedic Preferred provider/office: In network Reason for referral: Therapy for broken wrist

## 2019-05-13 NOTE — Telephone Encounter (Signed)
Left message for patient to call our office to schedule a virtual visit for broken wrist as I have not seen her for this previously.  Advised OK to schedule with another provider as I will be out of the office until Monday.

## 2019-05-19 ENCOUNTER — Ambulatory Visit (INDEPENDENT_AMBULATORY_CARE_PROVIDER_SITE_OTHER): Payer: Medicare Other | Admitting: Family

## 2019-05-19 ENCOUNTER — Other Ambulatory Visit: Payer: Self-pay

## 2019-05-19 DIAGNOSIS — S62102A Fracture of unspecified carpal bone, left wrist, initial encounter for closed fracture: Secondary | ICD-10-CM | POA: Diagnosis not present

## 2019-05-19 NOTE — Progress Notes (Signed)
Virtual Visit via Video Note  I connected with Suzanne Ali on 05/19/19 at  4:00 PM EDT by a video enabled telemedicine application and verified that I am speaking with the correct person using two identifiers.  Location: Patient: home Provider: office   I discussed the limitations of evaluation and management by telemedicine and the availability of in person appointments. The patient expressed understanding and agreed to proceed.  History of Present Illness:   Patient is a 68 yr old female who presents today with chief complaint of left sided wrist pain. Reports that this happened on Sunday afternoon in [facility].  Reports that she had surgery with hardware which was performed in [facility] Dr. Liz Beach. [facility].  Observations/Objective:   Gen: Awake, alert, no acute distress Resp: Breathing is even and non-labored Psych: calm/pleasant demeanor Neuro: Alert and Oriented x 3, + facial symmetry, speech is dysarthric but at baseline. MS: left wrist/forearm in ace wrap/soft cast, + hardware noted.    Assessment and Plan:  Left wrist fracture- will refer to a local orthopedist for further evaluation and surgical management.     Follow Up Instructions:    I discussed the assessment and treatment plan with the patient. The patient was provided an opportunity to ask questions and all were answered. The patient agreed with the plan and demonstrated an understanding of the instructions.   The patient was advised to call back or seek an in-person evaluation if the symptoms worsen or if the condition fails to improve as anticipated.  [name], NP

## 2019-05-21 ENCOUNTER — Ambulatory Visit (INDEPENDENT_AMBULATORY_CARE_PROVIDER_SITE_OTHER): Payer: Medicare Other

## 2019-05-21 ENCOUNTER — Ambulatory Visit (INDEPENDENT_AMBULATORY_CARE_PROVIDER_SITE_OTHER): Payer: Medicare Other | Admitting: Orthopaedic Surgery

## 2019-05-21 ENCOUNTER — Encounter: Payer: Self-pay | Admitting: Orthopaedic Surgery

## 2019-05-21 ENCOUNTER — Other Ambulatory Visit: Payer: Self-pay

## 2019-05-21 VITALS — Ht 63.0 in | Wt 198.0 lb

## 2019-05-21 DIAGNOSIS — M25532 Pain in left wrist: Secondary | ICD-10-CM
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
## 2019-05-28 DIAGNOSIS — M19012 Primary osteoarthritis, left shoulder: Secondary | ICD-10-CM | POA: Diagnosis not present

## 2019-05-28 DIAGNOSIS — S52502E Unspecified fracture of the lower end of left radius, subsequent encounter for open fracture type I or II with routine healing: Secondary | ICD-10-CM | POA: Diagnosis not present

## 2019-05-28 DIAGNOSIS — S52602E Unspecified fracture of lower end of left ulna, subsequent encounter for open fracture type I or II with routine healing: Secondary | ICD-10-CM | POA: Diagnosis not present

## 2019-06-25 DIAGNOSIS — S52602E Unspecified fracture of lower end of left ulna, subsequent encounter for open fracture type I or II with routine healing: Secondary | ICD-10-CM | POA: Diagnosis not present

## 2019-06-25 DIAGNOSIS — S52502E Unspecified fracture of the lower end of left radius, subsequent encounter for open fracture type I or II with routine healing: Secondary | ICD-10-CM | POA: Diagnosis not present

## 2019-07-06 ENCOUNTER — Other Ambulatory Visit: Payer: Self-pay

## 2019-07-06 ENCOUNTER — Ambulatory Visit (INDEPENDENT_AMBULATORY_CARE_PROVIDER_SITE_OTHER): Payer: Medicare Other | Admitting: Family

## 2019-07-06 VITALS — BP 136/87 | Wt 198.0 lb

## 2019-07-06 DIAGNOSIS — I10 Essential (primary) hypertension: Secondary | ICD-10-CM | POA: Diagnosis not present

## 2019-07-06 DIAGNOSIS — J45909 Unspecified asthma, uncomplicated: Secondary | ICD-10-CM

## 2019-07-06 DIAGNOSIS — E785 Hyperlipidemia, unspecified: Secondary | ICD-10-CM | POA: Diagnosis not present

## 2019-07-06 DIAGNOSIS — E118 Type 2 diabetes mellitus with unspecified complications: Secondary | ICD-10-CM

## 2019-07-06 DIAGNOSIS — R430 Anosmia: Secondary | ICD-10-CM

## 2019-07-06 MED ORDER — ROSUVASTATIN CALCIUM 40 MG PO TABS: ORAL_TABLET | ORAL | 1 refills | Status: DC

## 2019-07-06 MED ORDER — HYDROCHLOROTHIAZIDE 25 MG PO TABS: ORAL_TABLET | ORAL | 1 refills | Status: DC

## 2019-07-06 MED ORDER — SHINGRIX 50 MCG/0.5ML IM SUSR: INTRAMUSCULAR | 1 refills | Status: DC

## 2019-07-06 MED FILL — HYDROCHLOROTHIAZIDE 25 MG T: 25 | 90 days supply | Qty: 90 | Fill #0

## 2019-07-06 MED FILL — ROSUVASTATIN CALCIUM 40 MG: 40 | 90 days supply | Qty: 90 | Fill #0
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
## 2019-07-14 DIAGNOSIS — S52602D Unspecified fracture of lower end of left ulna, subsequent encounter for closed fracture with routine healing: Secondary | ICD-10-CM | POA: Diagnosis not present

## 2019-07-14 DIAGNOSIS — S52502D Unspecified fracture of the lower end of left radius, subsequent encounter for closed fracture with routine healing: Secondary | ICD-10-CM | POA: Diagnosis not present

## 2019-08-11 MED FILL — BREO ELLIPTA 100-25 MCG INH: 100-25 | 30 days supply | Qty: 60 | Fill #5

## 2019-08-11 MED FILL — VENTOLIN HFA 90 MCG INHALER: 108 (90 BAS | 25 days supply | Qty: 18 | Fill #1

## 2019-08-11 MED FILL — SPIRIVA RESPIMAT 2.5 MCG IN: 2.5 | 30 days supply | Qty: 4 | Fill #5

## 2019-08-19 ENCOUNTER — Telehealth: Payer: Self-pay

## 2019-08-19 NOTE — Telephone Encounter (Signed)
Spoke to pt. Questions answered. She states that CT scan of her wrist will be $500 which is too costly. I advised that she check with her insurance re: cost/deductibles. She plans to re-apply for disability due to hx of CVA.

## 2019-08-19 NOTE — Telephone Encounter (Signed)
Patient will like to speak with Melissa, "she has an injured wrist and orthopedic doctor will like for her to complete a test that is too expensive for her. She will like to go on disability" Please advise.

## 2019-08-19 NOTE — Telephone Encounter (Signed)
Copied from [place] [id number]. Topic: General - Inquiry >> Aug 19, 2019 10:08 AM Mathis Bud wrote: Reason for CRM: Patient is requesting a call from PCP.  Patient did not want disclose info, Call back [phone]

## 2019-09-21 MED FILL — SPIRIVA RESPIMAT 2.5 MCG IN: 2.5 | 30 days supply | Qty: 4 | Fill #0

## 2019-09-21 MED FILL — BREO ELLIPTA 100-25 MCG INH: 100-25 | 30 days supply | Qty: 60 | Fill #0

## 2019-09-21 MED FILL — ALBUTEROL SULFATE HFA 108 (: 108 (90 BAS | 25 days supply | Qty: 9 | Fill #0
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
## 2019-10-06 ENCOUNTER — Other Ambulatory Visit: Payer: Self-pay

## 2019-10-06 ENCOUNTER — Encounter: Payer: Self-pay | Admitting: *Deleted

## 2019-10-06 ENCOUNTER — Ambulatory Visit (INDEPENDENT_AMBULATORY_CARE_PROVIDER_SITE_OTHER): Payer: Medicare Other | Admitting: *Deleted

## 2019-10-06 VITALS — BP 126/85

## 2019-10-06 DIAGNOSIS — Z0000 Encounter for general adult medical examination without abnormal findings: Secondary | ICD-10-CM

## 2019-10-06 MED FILL — FLUAD QUADRIVALENT 0.5 ML P: 0.5 | 1 days supply | Qty: 1 | Fill #0

## 2019-10-06 MED FILL — HYDROCHLOROTHIAZIDE 25 MG T: 25 | 90 days supply | Qty: 90 | Fill #1

## 2019-10-06 MED FILL — ROSUVASTATIN CALCIUM 40 MG: 40 | 90 days supply | Qty: 90 | Fill #1

## 2019-10-06 NOTE — Patient Instructions (Addendum)
See you next year!  Continue to eat heart healthy diet (full of fruits, vegetables, whole grains, lean protein, water--limit salt, fat, and sugar intake) and increase physical activity as tolerated.  Continue doing brain stimulating activities (puzzles, reading, adult coloring books, staying active) to keep memory sharp.   Bring a copy of your living will and/or healthcare power of attorney to your next office visit.   Suzanne Ali , Thank you for taking time to come for your Medicare Wellness Visit. I appreciate your ongoing commitment to your health goals. Please review the following plan we discussed and let me know if I can assist you in the future.   These are the goals we discussed: Goals    . Increase physical activity       This is a list of the screening recommended for you and due dates:  Health Maintenance  Topic Date Due  .  Hepatitis C: One time screening is recommended by Center for Disease Control  (CDC) for  adults born from [birth year] through 1966.   09/03/1951  . Urine Protein Check  10/24/2017  . Eye exam for diabetics  11/12/2017  . Complete foot exam   11/25/2018  . Hemoglobin A1C  04/01/2019  . Flu Shot  06/13/2019  . Mammogram  10/06/2019  . Tetanus Vaccine  05/03/2022  . Colon Cancer Screening  06/14/2022  . DEXA scan (bone density measurement)  Completed  . Pneumonia vaccines  Completed    Preventive Care 26 Years and Older, Female Preventive care refers to lifestyle choices and visits with your health care provider that can promote health and wellness. This includes:  A yearly physical exam. This is also called an annual well check.  Regular dental and eye exams.  Immunizations.  Screening for certain conditions.  Healthy lifestyle choices, such as diet and exercise. What can I expect for my preventive care visit? Physical exam Your health care provider will check:  Height and weight. These may be used to calculate body mass index (BMI), which is a  measurement that tells if you are at a healthy weight.  Heart rate and blood pressure.  Your skin for abnormal spots. Counseling Your health care provider may ask you questions about:  Alcohol, tobacco, and drug use.  Emotional well-being.  Home and relationship well-being.  Sexual activity.  Eating habits.  History of falls.  Memory and ability to understand (cognition).  Work and work Statistician.  Pregnancy and menstrual history. What immunizations do I need?  Influenza (flu) vaccine  This is recommended every year. Tetanus, diphtheria, and pertussis (Tdap) vaccine  You may need a Td booster every 10 years. Varicella (chickenpox) vaccine  You may need this vaccine if you have not already been vaccinated. Zoster (shingles) vaccine  You may need this after age 1. Pneumococcal conjugate (PCV13) vaccine  One dose is recommended after age 52. Pneumococcal polysaccharide (PPSV23) vaccine  One dose is recommended after age 64. Measles, mumps, and rubella (MMR) vaccine  You may need at least one dose of MMR if you were born in 1957 or later. You may also need a second dose. Meningococcal conjugate (MenACWY) vaccine  You may need this if you have certain conditions. Hepatitis A vaccine  You may need this if you have certain conditions or if you travel or work in places where you may be exposed to hepatitis A. Hepatitis B vaccine  You may need this if you have certain conditions or if you travel or work in  places where you may be exposed to hepatitis B. Haemophilus influenzae type b (Hib) vaccine  You may need this if you have certain conditions. You may receive vaccines as individual doses or as more than one vaccine together in one shot (combination vaccines). Talk with your health care provider about the risks and benefits of combination vaccines. What tests do I need? Blood tests  Lipid and cholesterol levels. These may be checked every 5 years, or more  frequently depending on your overall health.  Hepatitis C test.  Hepatitis B test. Screening  Lung cancer screening. You may have this screening every year starting at age 49 if you have a 30-pack-year history of smoking and currently smoke or have quit within the past 15 years.  Colorectal cancer screening. All adults should have this screening starting at age 66 and continuing until age 55. Your health care provider may recommend screening at age 59 if you are at increased risk. You will have tests every 1-10 years, depending on your results and the type of screening test.  Diabetes screening. This is done by checking your blood sugar (glucose) after you have not eaten for a while (fasting). You may have this done every 1-3 years.  Mammogram. This may be done every 1-2 years. Talk with your health care provider about how often you should have regular mammograms.  BRCA-related cancer screening. This may be done if you have a family history of breast, ovarian, tubal, or peritoneal cancers. Other tests  Sexually transmitted disease (STD) testing.  Bone density scan. This is done to screen for osteoporosis. You may have this done starting at age 80. Follow these instructions at home: Eating and drinking  Eat a diet that includes fresh fruits and vegetables, whole grains, lean protein, and low-fat dairy products. Limit your intake of foods with high amounts of sugar, saturated fats, and salt.  Take vitamin and mineral supplements as recommended by your health care provider.  Do not drink alcohol if your health care provider tells you not to drink.  If you drink alcohol: ? Limit how much you have to 0-1 drink a day. ? Be aware of how much alcohol is in your drink. In the [place] one drink equals one 12 oz bottle of beer (355 mL), one 5 oz glass of wine (148 mL), or one 1 oz glass of hard liquor (44 mL). Lifestyle  Take daily care of your teeth and gums.  Stay active. Exercise for at  least 30 minutes on 5 or more days each week.  Do not use any products that contain nicotine or tobacco, such as cigarettes, e-cigarettes, and chewing tobacco. If you need help quitting, ask your health care provider.  If you are sexually active, practice safe sex. Use a condom or other form of protection in order to prevent STIs (sexually transmitted infections).  Talk with your health care provider about taking a low-dose aspirin or statin. What's next?  Go to your health care provider once a year for a well check visit.  Ask your health care provider how often you should have your eyes and teeth checked.  Stay up to date on all vaccines. This information is not intended to replace advice given to you by your health care provider. Make sure you discuss any questions you have with your health care provider. Document Released: 11/25/2015 Document Revised: 10/23/2018 Document Reviewed: 10/23/2018 Elsevier Patient Education  2020 [employer].

## 2019-11-27 ENCOUNTER — Telehealth: Payer: Self-pay | Admitting: Family

## 2019-11-27 MED FILL — BREO ELLIPTA 100-25 MCG INH: 100-25 | 30 days supply | Qty: 60 | Fill #1

## 2019-11-27 MED FILL — SPIRIVA RESPIMAT 2.5 MCG IN: 2.5 | 30 days supply | Qty: 4 | Fill #1

## 2019-11-27 NOTE — Telephone Encounter (Signed)
Patient advised to call radiology for appointment

## 2019-11-27 NOTE — Telephone Encounter (Signed)
Please call pt and remind her to schedule her mammogram.

## 2019-12-01 MED FILL — ALBUTEROL SULFATE HFA 108 (: 108 (90 BAS | 25 days supply | Qty: 7 | Fill #0

## 2020-01-04 ENCOUNTER — Other Ambulatory Visit: Payer: Self-pay | Admitting: Family

## 2020-01-04 MED FILL — ROSUVASTATIN CALCIUM 40 MG: 40 | 90 days supply | Qty: 90 | Fill #0

## 2020-01-04 MED FILL — HYDROCHLOROTHIAZIDE 25 MG T: 25 | 90 days supply | Qty: 90 | Fill #0

## 2020-01-04 MED FILL — BREO ELLIPTA 100-25 MCG INH: 100-25 | 30 days supply | Qty: 60 | Fill #2

## 2020-01-04 MED FILL — SPIRIVA RESPIMAT 2.5 MCG IN: 2.5 | 30 days supply | Qty: 4 | Fill #2

## 2020-01-08 NOTE — Progress Notes (Signed)
This ct from jan 2020 shows resolution of several LUL noduls other small nodules are stable. Last seen in 2018. Also has asthma  Plan   You can give her15 min followup
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
## 2020-01-22 ENCOUNTER — Encounter: Payer: Self-pay | Admitting: Internal Medicine

## 2020-01-22 ENCOUNTER — Ambulatory Visit: Payer: Medicare HMO | Admitting: Internal Medicine

## 2020-01-22 VITALS — BP 130/82 | HR 80 | Ht 63.0 in | Wt 216.4 lb

## 2020-01-22 DIAGNOSIS — I10 Essential (primary) hypertension: Secondary | ICD-10-CM | POA: Diagnosis not present

## 2020-01-22 DIAGNOSIS — E785 Hyperlipidemia, unspecified: Secondary | ICD-10-CM | POA: Diagnosis not present

## 2020-01-22 LAB — LIPID PANEL
Chol/HDL Ratio: 2.5 ratio (ref 0.0–4.4)
Cholesterol, Total: 142 mg/dL (ref 100–199)
HDL: 56 mg/dL (ref >39)
LDL Chol Calc (NIH): 72 mg/dL (ref 0–99)
Triglycerides: 68 mg/dL (ref 0–149)
VLDL Cholesterol Cal: 14 mg/dL (ref 5–40)

## 2020-01-22 LAB — BASIC METABOLIC PANEL
BUN/Creatinine Ratio: 23 (ref 12–28)
BUN: 34 mg/dL — ABNORMAL HIGH (ref 8–27)
CO2: 20 mmol/L (ref 20–29)
Calcium: 9.5 mg/dL (ref 8.7–10.3)
Chloride: 101 mmol/L (ref 96–106)
Creatinine, Ser: 1.48 mg/dL — ABNORMAL HIGH (ref 0.57–1.00)
GFR calc Af Amer: 41 mL/min/{1.73_m2} — ABNORMAL LOW (ref >59)
GFR calc non Af Amer: 36 mL/min/{1.73_m2} — ABNORMAL LOW (ref >59)
Glucose: 90 mg/dL (ref 65–99)
Potassium: 4.1 mmol/L (ref 3.5–5.2)
Sodium: 137 mmol/L (ref 134–144)

## 2020-01-22 LAB — CBC
Hematocrit: 42.1 % (ref 34.0–46.6)
Hemoglobin: 13.8 g/dL (ref 11.1–15.9)
MCH: 26.8 pg (ref 26.6–33.0)
MCHC: 32.8 g/dL (ref 31.5–35.7)
MCV: 82 fL (ref 79–97)
Platelets: 211 10*3/uL (ref 150–450)
RBC: 5.15 x10E6/uL (ref 3.77–5.28)
RDW: 14.5 % (ref 11.7–15.4)
WBC: 9.6 10*3/uL (ref 3.4–10.8)

## 2020-01-22 NOTE — Patient Instructions (Signed)
Medication Instructions:  No changes *If you need a refill on your cardiac medications before your next appointment, please call your pharmacy*   Lab Work: Today: cbc, bmet, lipid If you have labs (blood work) drawn today and your tests are completely normal, you will receive your results only by: Marland Kitchen MyChart Message (if you have MyChart) OR . A paper copy in the mail If you have any lab test that is abnormal or we need to change your treatment, we will call you to review the results.   Testing/Procedures: none   Follow-Up: At [facility], you and your health needs are our priority.  As part of our continuing mission to provide you with exceptional heart care, we have created designated Provider Care Teams.  These Care Teams include your primary Cardiologist (physician) and Advanced Practice Providers (APPs -  Physician Assistants and Nurse Practitioners) who all work together to provide you with the care you need, when you need it.  We recommend signing up for the patient portal called "MyChart".  Sign up information is provided on this After Visit Summary.  MyChart is used to connect with patients for Virtual Visits (Telemedicine).  Patients are able to view lab/test results, encounter notes, upcoming appointments, etc.  Non-urgent messages can be sent to your provider as well.   To learn more about what you can do with MyChart, go to [email or web address].    Your next appointment:   12 month(s)  The format for your next appointment:   In Person  Provider:   You may see Dorris Carnes, MD or one of the following Advanced Practice Providers on your designated Care Team:    Richardson Dopp, PA-C  [place], [place]  Daune Perch, NP    Other Instructions

## 2020-01-25 ENCOUNTER — Telehealth: Payer: Self-pay | Admitting: *Deleted

## 2020-01-25 DIAGNOSIS — I10 Essential (primary) hypertension: Secondary | ICD-10-CM

## 2020-01-25 MED ORDER — HYDROCHLOROTHIAZIDE 12.5 MG PO CAPS: 12.5000 mg | ORAL_CAPSULE | Freq: Every day | ORAL | 3 refills | Status: DC

## 2020-01-25 MED FILL — HYDROCHLOROTHIAZIDE 12.5 MG: 12.5 | 90 days supply | Qty: 90 | Fill #0

## 2020-01-25 NOTE — Telephone Encounter (Signed)
-----   Message from Dorris Carnes V, MD sent at 01/23/2020  8:02 PM EST ----- CBC is nromal Lpids are very good Cr up a little  1.48   Cut back on HCTZ to 12.5 mg    F/U BMET in 3 to 4 weeks

## 2020-01-25 NOTE — Telephone Encounter (Signed)
Reviewed with patient the lab results/recommendations from Dr. Harrington Challenger. She will cut hctz in half to 12.5 mg daily and return for labs on 02/19/20. Understands to come anytime that day for blood work.

## 2020-01-26 ENCOUNTER — Telehealth: Payer: Self-pay | Admitting: Family

## 2020-01-26 NOTE — Progress Notes (Signed)
  Chronic Care Management   Note  01/26/2020 Name: Suzanne Ali MRN: [id number] DOB: 06/02/1951  Suzanne Ali is a 69 y.o. year old female who is a primary care patient of [name], NP. I reached out to [place] by phone today in response to a referral sent by Suzanne Ali's PCP, [name], NP.   Suzanne Ali was given information about Chronic Care Management services today including:  1. CCM service includes personalized support from designated clinical staff supervised by her physician, including individualized plan of care and coordination with other care providers 2. 24/7 contact phone numbers for assistance for urgent and routine care needs. 3. Service will only be billed when office clinical staff spend 20 minutes or more in a month to coordinate care. 4. Only one practitioner may furnish and bill the service in a calendar month. 5. The patient may stop CCM services at any time (effective at the end of the month) by phone call to the office staff.   Patient agreed to services and verbal consent obtained.   Follow up plan:   Raynicia Dukes [employer] Scheduler

## 2020-01-28 ENCOUNTER — Telehealth: Payer: Self-pay | Admitting: Family

## 2020-01-28 NOTE — Telephone Encounter (Signed)
Patient's daughter Suma Roccaforte state that patient was be put on more BP pills By [facility].   Patient would like to know if she should continue taking BP  pills Lenna Sciara has put her on .

## 2020-01-28 NOTE — Telephone Encounter (Signed)
Patient is requesting to speak with the provider, patient did not disclose reason. Patient asked to leave reason.

## 2020-01-29 NOTE — Telephone Encounter (Signed)
I agree with the decrease in HCTZ. She recommended this because her kidney function was mildly normal and she thought it might be drying her out to much at the higher dose.

## 2020-01-29 NOTE — Telephone Encounter (Signed)
Patient advised of provider's comments. She will switch to dose advised by cardiology.

## 2020-02-05 ENCOUNTER — Other Ambulatory Visit: Payer: Self-pay

## 2020-02-05 DIAGNOSIS — E118 Type 2 diabetes mellitus with unspecified complications: Secondary | ICD-10-CM

## 2020-02-05 DIAGNOSIS — I10 Essential (primary) hypertension: Secondary | ICD-10-CM

## 2020-02-05 NOTE — Progress Notes (Signed)
ambu

## 2020-02-08 ENCOUNTER — Telehealth: Payer: Self-pay | Admitting: Internal Medicine

## 2020-02-08 ENCOUNTER — Other Ambulatory Visit: Payer: Self-pay

## 2020-02-08 ENCOUNTER — Ambulatory Visit: Payer: Medicare HMO | Admitting: Pharmacist

## 2020-02-08 DIAGNOSIS — I10 Essential (primary) hypertension: Secondary | ICD-10-CM

## 2020-02-08 DIAGNOSIS — J45909 Unspecified asthma, uncomplicated: Secondary | ICD-10-CM

## 2020-02-08 DIAGNOSIS — E118 Type 2 diabetes mellitus with unspecified complications: Secondary | ICD-10-CM

## 2020-02-08 DIAGNOSIS — L240 Irritant contact dermatitis due to detergents: Secondary | ICD-10-CM

## 2020-02-08 DIAGNOSIS — Z8673 Personal history of transient ischemic attack (TIA), and cerebral infarction without residual deficits: Secondary | ICD-10-CM

## 2020-02-08 DIAGNOSIS — E785 Hyperlipidemia, unspecified: Secondary | ICD-10-CM

## 2020-02-08 MED FILL — SPIRIVA RESPIMAT 2.5 MCG IN: 2.5 | 30 days supply | Qty: 4 | Fill #3

## 2020-02-08 MED FILL — BREO ELLIPTA 100-25 MCG INH: 100-25 | 30 days supply | Qty: 60 | Fill #3

## 2020-02-08 NOTE — Telephone Encounter (Signed)
New message   Pt c/o medication issue:  1. Name of Medication: rosuvastatin (CRESTOR) 40 MG tablet  2. How are you currently taking this medication (dosage and times per day)? As written  3. Are you having a reaction (difficulty breathing--STAT)? No   4. What is your medication issue? Per Melvenia Beam is calling to verify the dosage for this medication. Please call.

## 2020-02-08 NOTE — Telephone Encounter (Signed)
Returned call to [place] at [employer] office and verified that the dose of Rosuvastatin is still at 40 mg, that Dr. Harrington Challenger didn't change that at her last o/v.

## 2020-02-08 NOTE — Patient Instructions (Addendum)
Visit Information  Goals Addressed            This Visit's Progress   . A1c goal less than 7%      . Blood pressure goal less than 140/90      . Complete a1c at next office visit      . Continue rosuvastatin 28m once daily       I confirmed with Dr. RAlan Ripperoffice that you are to continue rosuvastatin 465monce daily    . LDL goal less than 70      . Pharmacy Care Plan       CARE PLAN ENTRY  Current Barriers:  . Chronic Disease Management support, education, and care coordination needs related to Asthma, HTN, HLD, Hx of CVA, DM, Pain, Rash  Pharmacist Clinical Goal(s):  . Marland KitchenDL goal <70 . Blood pressure goal <140/90 . A1c goal <7%  Interventions: . Comprehensive medication review performed. . Marland Kitchenollaboration with provider regarding medication management (rosuvastatin dose clarification)  Patient Self Care Activities:  . Patient verbalizes understanding of plan to follow as described above, Self administers medications as prescribed, Calls pharmacy for medication refills, and Calls provider office for new concerns or questions  Initial goal documentation        Ms. DaBlacketeras given information about Chronic Care Management services today including:  1. CCM service includes personalized support from designated clinical staff supervised by her physician, including individualized plan of care and coordination with other care providers 2. 24/7 contact phone numbers for assistance for urgent and routine care needs. 3. Standard insurance, coinsurance, copays and deductibles apply for chronic care management only during months in which we provide at least 20 minutes of these services. Most insurances cover these services at 100%, however patients may be responsible for any copay, coinsurance and/or deductible if applicable. This service may help you avoid the need for more expensive face-to-face services. 4. Only one practitioner may furnish and bill the service in a calendar  month. 5. The patient may stop CCM services at any time (effective at the end of the month) by phone call to the office staff.  Patient agreed to services and verbal consent obtained.   The patient verbalized understanding of instructions provided today and agreed to receive a mailed copy of patient instruction and/or educational materials. Telephone follow up appointment with pharmacy team member scheduled for: 08/10/2020  KaMelvenia Beamay, PharmD Clinical Pharmacist [phone] Healthy Eating Following a healthy eating pattern may help you to achieve and maintain a healthy body weight, reduce the risk of chronic disease, and live a long and productive life. It is important to follow a healthy eating pattern at an appropriate calorie level for your body. Your nutritional needs should be met primarily through food by choosing a variety of nutrient-rich foods. What are tips for following this plan? Reading food labels  Read labels and choose the following: ? Reduced or low sodium. ? Juices with 100% fruit juice. ? Foods with low saturated fats and high polyunsaturated and monounsaturated fats. ? Foods with whole grains, such as whole wheat, cracked wheat, brown rice, and wild rice. ? Whole grains that are fortified with folic acid. This is recommended for women who are pregnant or who want to become pregnant.  Read labels and avoid the following: ? Foods with a lot of added sugars. These include foods that contain brown sugar, corn sweetener, corn syrup, dextrose, fructose, glucose, high-fructose corn syrup,  honey, invert sugar, lactose, malt syrup, maltose, molasses, raw sugar, sucrose, trehalose, or turbinado sugar.  Do not eat more than the following amounts of added sugar per Loney Peto:  6 teaspoons (25 g) for women.  9 teaspoons (38 g) for men. ? Foods that contain processed or refined starches and grains. ? Refined grain products, such as  white flour, degermed cornmeal, white bread, and white rice. Shopping  Choose nutrient-rich snacks, such as vegetables, whole fruits, and nuts. Avoid high-calorie and high-sugar snacks, such as potato chips, fruit snacks, and candy.  Use oil-based dressings and spreads on foods instead of solid fats such as butter, stick margarine, or cream cheese.  Limit pre-made sauces, mixes, and "instant" products such as flavored rice, instant noodles, and ready-made pasta.  Try more plant-protein sources, such as tofu, tempeh, black beans, edamame, lentils, nuts, and seeds.  Explore eating plans such as the Mediterranean diet or vegetarian diet. Cooking  Use oil to saut or stir-fry foods instead of solid fats such as butter, stick margarine, or lard.  Try baking, boiling, grilling, or broiling instead of frying.  Remove the fatty part of meats before cooking.  Steam vegetables in water or broth. Meal planning   At meals, imagine dividing your plate into fourths: ? One-half of your plate is fruits and vegetables. ? One-fourth of your plate is whole grains. ? One-fourth of your plate is protein, especially lean meats, poultry, eggs, tofu, beans, or nuts.  Include low-fat dairy as part of your daily diet. Lifestyle  Choose healthy options in all settings, including home, work, school, restaurants, or stores.  Prepare your food safely: ? Wash your hands after handling raw meats. ? Keep food preparation surfaces clean by regularly washing with hot, soapy water. ? Keep raw meats separate from ready-to-eat foods, such as fruits and vegetables. ? Cook seafood, meat, poultry, and eggs to the recommended internal temperature. ? Store foods at safe temperatures. In general:  Keep cold foods at 67F (4.4C) or below.  Keep hot foods at 167F (60C) or above.  Keep your freezer at [facility] (-17.8C) or below.  Foods are no longer safe to eat when they have been between the temperatures of  40-167F (4.4-60C) for more than 2 hours. What foods should I eat? Fruits Aim to eat 2 cup-equivalents of fresh, canned (in natural juice), or frozen fruits each Krystie Leiter. Examples of 1 cup-equivalent of fruit include 1 small apple, 8 large strawberries, 1 cup canned fruit,  cup dried fruit, or 1 cup 100% juice. Vegetables Aim to eat 2-3 cup-equivalents of fresh and frozen vegetables each Tobenna Needs, including different varieties and colors. Examples of 1 cup-equivalent of vegetables include 2 medium carrots, 2 cups raw, leafy greens, 1 cup chopped vegetable (raw or cooked), or 1 medium baked potato. Grains Aim to eat 6 ounce-equivalents of whole grains each Tsuneo Faison. Examples of 1 ounce-equivalent of grains include 1 slice of bread, 1 cup ready-to-eat cereal, 3 cups popcorn, or  cup cooked rice, pasta, or cereal. Meats and other proteins Aim to eat 5-6 ounce-equivalents of protein each Amandeep Nesmith. Examples of 1 ounce-equivalent of protein include 1 egg, 1/2 cup nuts or seeds, or 1 tablespoon (16 g) peanut butter. A cut of meat or fish that is the size of a deck of cards is about 3-4 ounce-equivalents.  Of the protein you eat each week, try to have at least 8 ounces come from seafood. This includes salmon, trout, herring, and anchovies. Dairy Aim to eat 3 cup-equivalents of  fat-free or low-fat dairy each Geniene List. Examples of 1 cup-equivalent of dairy include 1 cup (240 mL) milk, 8 ounces (250 g) yogurt, 1 ounces (44 g) natural cheese, or 1 cup (240 mL) fortified soy milk. Fats and oils  Aim for about 5 teaspoons (21 g) per Rigoberto Repass. Choose monounsaturated fats, such as canola and olive oils, avocados, peanut butter, and most nuts, or polyunsaturated fats, such as sunflower, corn, and soybean oils, walnuts, pine nuts, sesame seeds, sunflower seeds, and flaxseed. Beverages  Aim for six 8-oz glasses of water per Kally Cadden. Limit coffee to three to five 8-oz cups per Dyann Goodspeed.  Limit caffeinated beverages that have added calories, such  as soda and energy drinks.  Limit alcohol intake to no more than 1 drink a Aleya Durnell for nonpregnant women and 2 drinks a Harlean Regula for men. One drink equals 12 oz of beer (355 mL), 5 oz of wine (148 mL), or 1 oz of hard liquor (44 mL). Seasoning and other foods  Avoid adding excess amounts of salt to your foods. Try flavoring foods with herbs and spices instead of salt.  Avoid adding sugar to foods.  Try using oil-based dressings, sauces, and spreads instead of solid fats. This information is based on general [place] nutrition guidelines. For more information, visit [employer].es. Exact amounts may vary based on your nutrition needs. Summary  A healthy eating plan may help you to maintain a healthy weight, reduce the risk of chronic diseases, and stay active throughout your life.  Plan your meals. Make sure you eat the right portions of a variety of nutrient-rich foods.  Try baking, boiling, grilling, or broiling instead of frying.  Choose healthy options in all settings, including home, work, school, restaurants, or stores. This information is not intended to replace advice given to you by your health care provider. Make sure you discuss any questions you have with your health care provider. Document Revised: 02/10/2018 Document Reviewed: 02/10/2018 Elsevier Patient Education  [place].

## 2020-02-08 NOTE — Chronic Care Management (AMB) (Signed)
Chronic Care Management Pharmacy  Name: Suzanne Ali  MRN: [id number] DOB: [birth year]  Chief Complaint/ HPI  Suzanne Ali,  68 y.o. , female presents for their Initial CCM visit with the clinical pharmacist via telephone due to COVID-19 Pandemic.  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 Brain MRI requested, but pt has hardware in place d/t wrist fractured. Pt to inform when hardware removed so MRI order can be placed. Labs ordered but not collected d/t virtual visit (bmp, a1c, hepatic function panel, lipid panel)   Consult Visit: 01/25/20: Cardio phone call from Rodman Key, RN - Pt informed to cut hctz dose in half to 12.5mg  (due to increase in Cr) and return for labs on 02/19/20 per Dr. Harrington Challenger  01/22/20: Cardio visit w/ Dr. Harrington Challenger - EKG ordered. SR 80 bpm. Pt w/ significant coronary artery calcifications of CT of chest. No anginal symptoms. No recurrence of syncope. BP controlled. Labs ordered (CBC, bmp, lipids)  Medications: Outpatient Encounter Medications as of 02/08/2020  Medication Sig Note  . acetaminophen (TYLENOL) 325 MG tablet Take 650 mg by mouth every 6 (six) hours as needed for mild pain, moderate pain or headache.   . albuterol (PROAIR HFA) 108 (90 Base) MCG/ACT inhaler Inhale 2 puffs into the lungs every 6 (six) hours as needed for wheezing or shortness of breath.   Marland Kitchen aspirin EC 81 MG tablet Take 1 tablet (81 mg total) by mouth daily.   . betamethasone dipropionate (DIPROLENE) 0.05 % cream Apply topically 2 (two) times daily.   . fluticasone furoate-vilanterol (BREO ELLIPTA) 100-25 MCG/INH AEPB INHALE 1 PUFF BY MOUTH INTO THE LUNGS DAILY.   . hydrochlorothiazide (MICROZIDE) 12.5 MG capsule Take 1 capsule (12.5  mg total) by mouth daily.   . rosuvastatin (CRESTOR) 40 MG tablet TAKE 1 TABLET (40 MG TOTAL) BY MOUTH DAILY. 02/08/2020: Patient takine 1/2 tab. Says she was instructed to do so at last cardio visit  . Tiotropium Bromide Monohydrate (SPIRIVA RESPIMAT) 2.5 MCG/ACT AERS Inhale 2 puffs into the lungs daily.   Marland Kitchen Zoster Vaccine Adjuvanted [facility]) injection Inject 0.5mg  IM now and again in 2-6 months. (Patient not taking: Reported on 02/08/2020)    Facility-Administered Encounter Medications as of 02/08/2020  Medication  . 0.9 %  sodium chloride infusion  . 0.9 %  sodium chloride infusion   Immunization History  Administered Date(s) Administered  . Fluad Quad(high Dose 65+) 10/06/2019  . Influenza, High Dose Seasonal PF 07/23/2016, 08/26/2017, 10/01/2018  . Influenza,inj,Quad PF,6+ Mos 10/23/2013, 09/20/2014, 07/25/2015  . Pneumococcal Conjugate-13 07/25/2015  . Pneumococcal Polysaccharide-23 01/23/2017  . Tdap 05/03/2012   Received both Covid vaccines. Received 2nd Covid vaccine on 01/19/2020  States she developed a rash after the second Covid vaccine. It is now cleared  Current Diagnosis/Assessment:  Goals Addressed            This Visit's Progress   . A1c goal less than 7%      . Blood pressure goal less than 140/90      . Complete a1c at next office visit      . Continue rosuvastatin 40mg  once daily       I confirmed with Dr. Alan Ripper office that you are to continue rosuvastatin 40mg  once daily    .  LDL goal less than 70      . Pharmacy Care Plan       CARE PLAN ENTRY  Current Barriers:  . Chronic Disease Management support, education, and care coordination needs related to Asthma, HTN, HLD, Hx of CVA, DM, Pain, Rash  Pharmacist Clinical Goal(s):  Marland Kitchen LDL goal <70 . Blood pressure goal <140/90 . A1c goal <7%  Interventions: . Comprehensive medication review performed. Marland Kitchen Collaboration with provider regarding medication management (rosuvastatin dose clarification)  Patient  Self Care Activities:  . Patient verbalizes understanding of plan to follow as described above, Self administers medications as prescribed, Calls pharmacy for medication refills, and Calls provider office for new concerns or questions  Initial goal documentation       Social Hx:  Waiting to get a new mobile home. Currently living with son in [place].  Has 3 children.  Has 6 grandchildren.  Asthma / Tobacco   Last spirometry score: 10/31/2017 (per note from Dr. Chase Caller)  FEV1 1.34/77% FVC 1.8/81%  Ratio 74  Mild/Moderate Obstruction  Eosinophil count:   Lab Results  Component Value Date/Time   EOSPCT 9 11/16/2018 08:13 PM  %                               Eos (Absolute):  Lab Results  Component Value Date/Time   EOSABS 0.8 (H) 11/16/2018 08:13 PM    Tobacco Status:  Social History   Tobacco Use  Smoking Status Never Smoker  Smokeless Tobacco Never Used    Patient has failed these meds in past: symbicort (listed in D/C meds. No apparent reason for D/C) Patient is currently controlled on the following medications: spiriva 2.67mcg/act 2 puffs daily, breo ellipta 100-52mcg/inh 1 puff daily Using maintenance inhaler regularly? Yes Frequency of rescue inhaler use:  infrequently   Hasn't had to use albuterol as much because of staying inside and using mask, so she is not exposed to pollen triggers as much.   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  She states she now does not get as short of breath as she did when she first arrived at the home  Plan -Continue current medications  Hypertension   CMP Latest Ref Rng & Units 01/22/2020 11/16/2018 10/01/2018  Glucose 65 - 99 mg/dL 90 89 94  BUN 8 - 27 mg/dL 34(H) 24(H) 30(H)  Creatinine 0.57 - 1.00 mg/dL 1.48(H) 0.98 1.34(H)  Sodium 134 - 144 mmol/L 137 138 138  Potassium 3.5 - 5.2 mmol/L 4.1 3.8 4.1  Chloride 96 - 106 mmol/L 101 108 104    CO2 20 - 29 mmol/L 20 23 24   Calcium 8.7 - 10.3 mg/dL 9.5 9.2 9.7  Total Protein 6.5 - 8.1 g/dL - 7.7 7.7  Total Bilirubin 0.3 - 1.2 mg/dL - 0.7 0.6  Alkaline Phos 38 - 126 U/L - 54 58  AST 15 - 41 U/L - 22 19  ALT 0 - 44 U/L - 15 16  GFR      41   >60   50.50   BP today is: 140/91 (has not taken blood pressure medicine yet)  Office blood pressures are  BP Readings from Last 3 Encounters:  01/22/20 130/82  10/06/19 126/85  07/06/19 136/87   BP goal <140/90  Patient has failed these meds in the past: ACEI (angioedema), benicar (  hair loss, dry skin) Patient is currently controlled on the following medications: hctz 12.5mg  daily (was recently decreased per cardio)  Patient checks BP at home 3-5x per week  Patient home BP readings are ranging: 120s-130s/80s   Explained to patient the reason for her decrease of hctz  Plan -Continue current medications     Hyperlipidemia   Lipid Panel     Component Value Date/Time   CHOL 142 01/22/2020 1037   TRIG 68 01/22/2020 1037   HDL 56 01/22/2020 1037   CHOLHDL 2.5 01/22/2020 1037   CHOLHDL 3 10/01/2018 0904   VLDL 16.2 10/01/2018 0904   LDLCALC 72 01/22/2020 1037   LABVLDL 14 01/22/2020 1037    LDL goal <70  The 10-year ASCVD risk score Mikey Bussing DC Jr., et al., 2013) is: 19.5%   Values used to calculate the score:     Age: 71 years     Sex: Female     Is Non-Hispanic African American: Yes     Diabetic: Yes     Tobacco smoker: No     Systolic Blood Pressure: [id number]     Is BP treated: Yes     HDL Cholesterol: 56 mg/dL     Total Cholesterol: 142 mg/dL   Patient has failed these meds in past: atorvastatin (inefficacy) Patient is currently controlled on the following medications: rosuvastatin 40mg  (1/2 tab daily) prescribed to take 1 tab daily  Patient states she was told at last cardio visit to cut cholesterol medication in half. Did not see this noted in most recent cardio note.  Patient states she initially did not  understand why her hctz was being cut in half.  Wonder if patient confused hctz decrease with rosuvastatin decrease. Will verify with cardio.  We discussed:  Possible confusion with cutting hctz in half vs rosuvastatin. Will confirm with cardio  Plan -Continue current medications  -Confirm rosuvastatin dose with cardio (Confirmed with Anderson Malta at Dr. Alan Ripper office that patient is to continue 40mg ; called patient back and confirmed this with her. She verbalized understanding.)  Hx of CVA    Patient has failed these meds in past: atorvastatin (inefficacy) Patient is currently controlled on the following medications:  rosuvastatin 40mg  (1/2 tab daily) prescribed to take 1 tab daily, aspirin 81mg  daily  Plan -Continue current medications   Diabetes   Recent Relevant Labs: Lab Results  Component Value Date/Time   HGBA1C 6.2 10/01/2018 09:04 AM   HGBA1C 5.9 (H) 06/27/2018 03:49 PM   MICROALBUR 4.5 (H) 10/24/2016 11:52 AM     Checking BG: Never Patient is currently controlled on the following medications: None  Last diabetic Eye exam: No results found for: HMDIABEYEEXA  Last diabetic Foot exam: No results found for: HMDIABFOOTEX   Plan -Continue control with diet and exercise  -Complete a1c at next office visit  Pain    Patient has failed these meds in past: None noted  Patient is currently controlled on the following medications: acetaminophen 325mg  PRN  Does not use acetaminophen often. Maybe once every 2-3 weeks  Plan -Continue current medications   Rash    Patient has failed these meds in past: None noted  Patient is currently controlled on the following medications: betamethasone dipropionate 0.05% cream PRN  Plan -Continue current medications

## 2020-02-19 ENCOUNTER — Other Ambulatory Visit: Payer: Self-pay

## 2020-02-19 ENCOUNTER — Other Ambulatory Visit: Payer: Medicare HMO | Admitting: *Deleted

## 2020-02-19 DIAGNOSIS — I10 Essential (primary) hypertension: Secondary | ICD-10-CM

## 2020-02-19 LAB — BASIC METABOLIC PANEL
BUN/Creatinine Ratio: 20 (ref 12–28)
BUN: 24 mg/dL (ref 8–27)
CO2: 21 mmol/L (ref 20–29)
Calcium: 9.4 mg/dL (ref 8.7–10.3)
Chloride: 104 mmol/L (ref 96–106)
Creatinine, Ser: 1.2 mg/dL — ABNORMAL HIGH (ref 0.57–1.00)
GFR calc Af Amer: 53 mL/min/{1.73_m2} — ABNORMAL LOW (ref >59)
GFR calc non Af Amer: 46 mL/min/{1.73_m2} — ABNORMAL LOW (ref >59)
Glucose: 109 mg/dL — ABNORMAL HIGH (ref 65–99)
Potassium: 4.1 mmol/L (ref 3.5–5.2)
Sodium: 143 mmol/L (ref 134–144)

## 2020-02-22 ENCOUNTER — Ambulatory Visit: Payer: Self-pay | Admitting: Pharmacist

## 2020-02-22 NOTE — Chronic Care Management (AMB) (Signed)
Patient called regarding results of her most recent lab. Reviewed lab results with patient. Labs unremarkable except for elevated glucose. Kidney function declined, but stable from previous.   Patient would benefit from having a1c drawn to assess her risk for diabetes.   Patient also inquired about medication to use for gas. Recommended simethicone PRN. Patient verbalized understanding and said she would try it. Will call patient at the end of the week to assess efficacy.    Plan -Start simethicone 180mg  PRN gas -Continue current medication regimen (noting pt is now taking whole tablet of rosuvastatin and 12.5mg  of hctz) -Get a1c at next visit

## 2020-02-24 ENCOUNTER — Ambulatory Visit: Payer: Medicare Other | Admitting: Internal Medicine

## 2020-02-26 ENCOUNTER — Other Ambulatory Visit (INDEPENDENT_AMBULATORY_CARE_PROVIDER_SITE_OTHER): Payer: Medicare HMO

## 2020-02-26 ENCOUNTER — Ambulatory Visit: Payer: Medicare HMO | Admitting: Pharmacist

## 2020-02-26 ENCOUNTER — Other Ambulatory Visit: Payer: Self-pay

## 2020-02-26 VITALS — BP 152/90 | HR 68 | Temp 96.8°F | Wt 221.4 lb

## 2020-02-26 DIAGNOSIS — E118 Type 2 diabetes mellitus with unspecified complications: Secondary | ICD-10-CM

## 2020-02-26 DIAGNOSIS — E785 Hyperlipidemia, unspecified: Secondary | ICD-10-CM

## 2020-02-26 DIAGNOSIS — I10 Essential (primary) hypertension: Secondary | ICD-10-CM

## 2020-02-26 NOTE — Patient Instructions (Addendum)
Visit Information  Goals Addressed            This Visit's Progress   . COMPLETED: Complete a1c at next office visit   On track   . COMPLETED: Continue rosuvastatin '40mg'$  once daily   On track    I confirmed with Dr. Alan Ripper office that you are to continue rosuvastatin '40mg'$  once daily    . Diabetes: A1c goal less than 7%       CARE PLAN ENTRY (see longitudinal plan of care for additional care plan information)  Current Barriers:  . Diabetes Lab Results  Component Value Date   HGBA1C 6.2 10/01/2018 .   Lab Results  Component Value Date   CREATININE 1.20 (H) 02/19/2020   CREATININE 1.48 (H) 01/22/2020   CREATININE 0.98 11/16/2018 .   Marland Kitchen No results found for: EGFR . Current antihyperglycemic regimen: diet and exercise . Cardiovascular risk reduction: o Current hypertensive regimen: hctz 12.'5mg'$  daily o Current hyperlipidemia regimen: rosuvastatin '40mg'$  daily o Current antiplatelet regimen: aspirin '81mg'$  daily  Pharmacist Clinical Goal(s):  Marland Kitchen Over the next 90 days, patient will work with PharmD and primary care provider to address keeping a1c <7%  Interventions: . Comprehensive medication review performed, medication list updated in electronic medical record . Inter-disciplinary care team collaboration (see longitudinal plan of care) . A1c lab drawn today in office  Patient Self Care Activities:  . Patient will continue to manage diabetes through diet and exercise as long as a1c remains <7% . Patient will report any questions or concerns to provider   Initial goal documentation     . Hyperlipidemia: LDL goal less than 70   Not on track    [place] (see longitudinal plan of care for additional care plan information)  Current Barriers:  . Uncontrolled hyperlipidemia, complicated by diabetes, hx of CVA . Current antihyperlipidemic regimen: rosuvastatin '40mg'$   . Previous antihyperlipidemic medications tried atorvastatin  . Most recent lipid panel:     Component  Value Date/Time   CHOL 142 01/22/2020 1037   TRIG 68 01/22/2020 1037   HDL 56 01/22/2020 1037   CHOLHDL 2.5 01/22/2020 1037   CHOLHDL 3 10/01/2018 0904   VLDL 16.2 10/01/2018 0904   LDLCALC 72 01/22/2020 1037 .   Marland Kitchen ASCVD risk enhancing conditions: age >16, DM, HTN, CKD, CHF, current smoker . 10-year ASCVD risk score: Pt has hx of ASCVD  Pharmacist Clinical Goal(s):  Marland Kitchen Over the next 90 days, patient will work with PharmD and providers towards medication adherence of rosuvastatin '40mg'$  daily  Interventions: . Comprehensive medication review performed; medication list updated in electronic medical record.  Bertram Savin care team collaboration (see longitudinal plan of care) . Verified correct dosage of rosuvastatin to be '40mg'$  daily vs 1/2 tab ('20mg'$ ) of rosuvastatin '40mg'$ .  Patient Self Care Activities:  . Patient will focus on medication adherence by maintaining appropriate fill dates of rosuvastatin  Initial goal documentation     . Hypertension: Blood pressure goal less than 140/90   Not on track    [place] (see longitudinal plan of care for additional care plan information)  Current Barriers:  . Uncontrolled hypertension, complicated by diabetes and hx of CVA . Current antihypertensive regimen: hctz 12.'5mg'$  daily . Previous antihypertensives tried: lisinopril (angioedema), benicar (hair loss, dry skin) . Last practice recorded BP readings:  BP Readings from Last 3 Encounters:  02/26/20 (!) 152/90  01/22/20 130/82  10/06/19 126/85 .  Current home BP readings: N/A . Most recent  eGFR: 41  Pharmacist Clinical Goal(s):  Marland Kitchen Over the next 90 days, patient will work with PharmD and providers to optimize antihypertensive regimen  Interventions: . Inter-disciplinary care team collaboration (see longitudinal plan of care) . Comprehensive medication review performed; medication list updated in the electronic medical record.   Patient Self Care Activities:  . Patient  will continue to check BP 2-3 times per week , document, and provide at future appointments . Patient will focus on medication adherence by appropriate fill dates of hctz  Initial goal documentation     . Pharmacy Care Plan   On track    CARE PLAN ENTRY  Current Barriers:  . Chronic Disease Management support, education, and care coordination needs related to Asthma, HTN, HLD, Hx of CVA, DM, Pain, Rash  Pharmacist Clinical Goal(s):  Marland Kitchen LDL goal <70 . Blood pressure goal <140/90 . A1c goal <7%  Interventions: . Comprehensive medication review performed. . Collaboration with provider regarding medication management (confirmed rosuvastatin dose is '40mg'$  daily) . Check blood pressure 2-3 times daily and record  Patient Self Care Activities:  . Patient verbalizes understanding of plan to follow as described above, Self administers medications as prescribed, Calls pharmacy for medication refills, and Calls provider office for new concerns or questions  Please see past updates related to this goal by clicking on the "Past Updates" button in the selected goal         The patient verbalized understanding of instructions provided today and agreed to receive a mailed copy of patient instruction and/or educational materials.  Telephone follow up appointment with pharmacy team member scheduled for: 03/31/2020  Melvenia Beam Faris Coolman, PharmD Clinical Pharmacist [facility] [phone]

## 2020-02-26 NOTE — Addendum Note (Signed)
Addended by: Kelle Darting A on: 02/26/2020 03:54 PM   Modules accepted: Orders

## 2020-02-26 NOTE — Addendum Note (Signed)
Addended by: Kelle Darting A on: 02/26/2020 02:51 PM   Modules accepted: Orders

## 2020-02-26 NOTE — Chronic Care Management (AMB) (Signed)
Chronic Care Management Pharmacy  Name: Suzanne Ali  MRN: [id number] DOB: 11-Aug-1951  Chief Complaint/ HPI  Suzanne Ali Cousin,  69 y.o. , female presents for their Follow-Up CCM visit with the clinical pharmacist In office (although scheduled as a brief follow up telephone call to assess OTC med recommendation).  PCP : [name], NP  Their chronic conditions include: Asthma, HTN, HLD, Hx of CVA, DM, Pain, Rash  Office Visits: 02/22/20: Patient called Suzanne Ali regarding lab results. Informed patient labs unremarkable except elevated glucose and stable but reduced kidney function. Pt asked for recommendation for gas. Recommended simethicone PRN and that pt would be called later in the week to assess efficacy.    Consult Visit: None since last CCM Visit  Medications: Outpatient Encounter Medications as of 02/26/2020  Medication Sig Note  . acetaminophen (TYLENOL) 325 MG tablet Take 650 mg by mouth every 6 (six) hours as needed for mild pain, moderate pain or headache.   . albuterol (PROAIR HFA) 108 (90 Base) MCG/ACT inhaler Inhale 2 puffs into the lungs every 6 (six) hours as needed for wheezing or shortness of breath.   Marland Kitchen aspirin EC 81 MG tablet Take 1 tablet (81 mg total) by mouth daily.   . betamethasone dipropionate (DIPROLENE) 0.05 % cream Apply topically 2 (two) times daily.   . fluticasone furoate-vilanterol (BREO ELLIPTA) 100-25 MCG/INH AEPB INHALE 1 PUFF BY MOUTH INTO THE LUNGS DAILY.   . hydrochlorothiazide (MICROZIDE) 12.5 MG capsule Take 1 capsule (12.5 mg total) by mouth daily.   . rosuvastatin (CRESTOR) 40 MG tablet TAKE 1 TABLET (40 MG TOTAL) BY MOUTH DAILY. 02/26/2020: Now taking whole tablet  . Tiotropium Bromide Monohydrate (SPIRIVA RESPIMAT) 2.5 MCG/ACT AERS Inhale 2 puffs into the lungs daily.   Marland Kitchen Zoster Vaccine Adjuvanted [facility]) injection Inject 0.'5mg'$  IM now and again in 2-6 months. (Patient not taking: Reported on 02/08/2020)    Facility-Administered  Encounter Medications as of 02/26/2020  Medication  . 0.9 %  sodium chloride infusion  . 0.9 %  sodium chloride infusion   Immunization History  Administered Date(s) Administered  . Fluad Quad(high Dose 65+) 10/06/2019  . Influenza, High Dose Seasonal PF 07/23/2016, 08/26/2017, 10/01/2018  . Influenza,inj,Quad PF,6+ Mos 10/23/2013, 09/20/2014, 07/25/2015  . Pneumococcal Conjugate-13 07/25/2015  . Pneumococcal Polysaccharide-23 01/23/2017  . Tdap 05/03/2012   Received both Covid vaccines. Received 2nd Covid vaccine on 01/19/2020  States she developed a rash after the second Covid vaccine. It is now cleared  Current Diagnosis/Assessment:  Goals Addressed            This Visit's Progress   . COMPLETED: Complete a1c at next office visit   On track   . COMPLETED: Continue rosuvastatin '40mg'$  once daily   On track    I confirmed with Dr. Alan Ripper office that you are to continue rosuvastatin '40mg'$  once daily    . Diabetes: A1c goal less than 7%       CARE PLAN ENTRY (see longitudinal plan of care for additional care plan information)  Current Barriers:  . Diabetes Lab Results  Component Value Date   HGBA1C 6.2 10/01/2018 .   Lab Results  Component Value Date   CREATININE 1.20 (H) 02/19/2020   CREATININE 1.48 (H) 01/22/2020   CREATININE 0.98 11/16/2018 .   Marland Kitchen No results found for: EGFR . Current antihyperglycemic regimen: diet and exercise . Cardiovascular risk reduction: o Current hypertensive regimen: hctz 12.'5mg'$  daily o Current hyperlipidemia regimen: rosuvastatin '40mg'$  daily o Current  antiplatelet regimen: aspirin '81mg'$  daily  Pharmacist Clinical Goal(s):  Marland Kitchen Over the next 90 days, patient will work with PharmD and primary care provider to address keeping a1c <7%  Interventions: . Comprehensive medication review performed, medication list updated in electronic medical record . Inter-disciplinary care team collaboration (see longitudinal plan of care) . A1c lab drawn today in  office  Patient Self Care Activities:  . Patient will continue to manage diabetes through diet and exercise as long as a1c remains <7% . Patient will report any questions or concerns to provider   Initial goal documentation     . Hyperlipidemia: LDL goal less than 70   Not on track    [place] (see longitudinal plan of care for additional care plan information)  Current Barriers:  . Uncontrolled hyperlipidemia, complicated by diabetes, hx of CVA . Current antihyperlipidemic regimen: rosuvastatin '40mg'$   . Previous antihyperlipidemic medications tried atorvastatin  . Most recent lipid panel:     Component Value Date/Time   CHOL 142 01/22/2020 1037   TRIG 68 01/22/2020 1037   HDL 56 01/22/2020 1037   CHOLHDL 2.5 01/22/2020 1037   CHOLHDL 3 10/01/2018 0904   VLDL 16.2 10/01/2018 0904   LDLCALC 72 01/22/2020 1037 .   Marland Kitchen ASCVD risk enhancing conditions: age >42, DM, HTN, CKD, CHF, current smoker . 10-year ASCVD risk score: Pt has hx of ASCVD  Pharmacist Clinical Goal(s):  Marland Kitchen Over the next 90 days, patient will work with PharmD and providers towards medication adherence of rosuvastatin '40mg'$  daily  Interventions: . Comprehensive medication review performed; medication list updated in electronic medical record.  Bertram Savin care team collaboration (see longitudinal plan of care) . Verified correct dosage of rosuvastatin to be '40mg'$  daily vs 1/2 tab ('20mg'$ ) of rosuvastatin '40mg'$ .  Patient Self Care Activities:  . Patient will focus on medication adherence by maintaining appropriate fill dates of rosuvastatin  Initial goal documentation     . Hypertension: Blood pressure goal less than 140/90   Not on track    [place] (see longitudinal plan of care for additional care plan information)  Current Barriers:  . Uncontrolled hypertension, complicated by diabetes and hx of CVA . Current antihypertensive regimen: hctz 12.'5mg'$  daily . Previous antihypertensives tried:  lisinopril (angioedema), benicar (hair loss, dry skin) . Last practice recorded BP readings:  BP Readings from Last 3 Encounters:  02/26/20 (!) 152/90  01/22/20 130/82  10/06/19 126/85 .  Current home BP readings: N/A . Most recent eGFR: 41  Pharmacist Clinical Goal(s):  Marland Kitchen Over the next 90 days, patient will work with PharmD and providers to optimize antihypertensive regimen  Interventions: . Inter-disciplinary care team collaboration (see longitudinal plan of care) . Comprehensive medication review performed; medication list updated in the electronic medical record.   Patient Self Care Activities:  . Patient will continue to check BP 2-3 times per week , document, and provide at future appointments . Patient will focus on medication adherence by appropriate fill dates of hctz  Initial goal documentation     . Pharmacy Care Plan   On track    CARE PLAN ENTRY  Current Barriers:  . Chronic Disease Management support, education, and care coordination needs related to Asthma, HTN, HLD, Hx of CVA, DM, Pain, Rash  Pharmacist Clinical Goal(s):  Marland Kitchen LDL goal <70 . Blood pressure goal <140/90 . A1c goal <7%  Interventions: . Comprehensive medication review performed. . Collaboration with provider regarding medication management (confirmed rosuvastatin dose is '40mg'$   daily) . Check blood pressure 2-3 times daily and record  Patient Self Care Activities:  . Patient verbalizes understanding of plan to follow as described above, Self administers medications as prescribed, Calls pharmacy for medication refills, and Calls provider office for new concerns or questions  Please see past updates related to this goal by clicking on the "Past Updates" button in the selected goal        Social Hx:  Waiting to get a new mobile home. Currently living with son in [place].  Has 3 children.  Has 6 grandchildren.    Hypertension   CMP Latest Ref Rng & Units 02/19/2020 01/22/2020 11/16/2018   Glucose 65 - 99 mg/dL 109(H) 90 89  BUN 8 - 27 mg/dL 24 34(H) 24(H)  Creatinine 0.57 - 1.00 mg/dL 1.20(H) 1.48(H) 0.98  Sodium 134 - 144 mmol/L 143 137 138  Potassium 3.5 - 5.2 mmol/L 4.1 4.1 3.8  Chloride 96 - 106 mmol/L 104 101 108  CO2 20 - 29 mmol/L '21 20 23  '$ Calcium 8.7 - 10.3 mg/dL 9.4 9.5 9.2  Total Protein 6.5 - 8.1 g/dL - - 7.7  Total Bilirubin 0.3 - 1.2 mg/dL - - 0.7  Alkaline Phos 38 - 126 U/L - - 54  AST 15 - 41 U/L - - 22  ALT 0 - 44 U/L - - 15  GFR      41   >60   50.50   BP today is: As noted below (>140/90)  Office blood pressures are  BP Readings from Last 3 Encounters:  02/26/20 (!) 152/90  01/22/20 130/82  10/06/19 126/85   BP goal <140/90  Patient has failed these meds in the past: ACEI (angioedema), benicar (hair loss, dry skin) Patient is currently uncontrolled on the following medications: hctz 12.'5mg'$  daily (was recently decreased per cardio)  Patient checks BP at home 3-5x per week  Patient home BP readings are ranging: 120s-130s/80s  Encouraged patient to check BP 2-3 times per week and record readings. BP may be elevated today due to office visit. Will assess further in 4 weeks.  Plan -Check BP 2-3 times per week and record readings -Continue current medications     Hyperlipidemia   Lipid Panel     Component Value Date/Time   CHOL 142 01/22/2020 1037   TRIG 68 01/22/2020 1037   HDL 56 01/22/2020 1037   CHOLHDL 2.5 01/22/2020 1037   CHOLHDL 3 10/01/2018 0904   VLDL 16.2 10/01/2018 0904   LDLCALC 72 01/22/2020 1037   LABVLDL 14 01/22/2020 1037    LDL goal <70  The 10-year ASCVD risk score Mikey Bussing DC Jr., et al., 2013) is: 25.8%   Values used to calculate the score:     Age: 28 years     Sex: Female     Is Non-Hispanic African American: Yes     Diabetic: Yes     Tobacco smoker: No     Systolic Blood Pressure: 268 mmHg     Is BP treated: Yes     HDL Cholesterol: 56 mg/dL     Total Cholesterol: 142 mg/dL   Patient has failed  these meds in past: atorvastatin (inefficacy) Patient is currently controlled on the following medications: rosuvastatin '40mg'$  daily  Cleared up confusion for patient regarding rosuvastatin dose.  She states she is now taking 1 whole tab daily vs 1/2 tab.  Patient was most likely confused cutting the hctz dose in half for cutting the rosuvastatin dose in half  We discussed:  Importance of med adherence.   Plan -Continue current medications    Hx of CVA    Patient has failed these meds in past: atorvastatin (inefficacy) Patient is currently controlled on the following medications:  rosuvastatin '40mg'$  daily, aspirin '81mg'$  daily  Plan -Continue current medications   Diabetes   Recent Relevant Labs: Lab Results  Component Value Date/Time   HGBA1C 6.2 10/01/2018 09:04 AM   HGBA1C 5.9 (H) 06/27/2018 03:49 PM   MICROALBUR 4.5 (H) 10/24/2016 11:52 AM     Checking BG: Never Patient is currently controlled on the following medications: None  Last diabetic Eye exam: No results found for: HMDIABEYEEXA  Last diabetic Foot exam: No results found for: HMDIABFOOTEX   Plan -Continue control with diet and exercise  -Complete a1c at next office visit (lab drawn today, will assess with patient at follow up call)

## 2020-02-27 LAB — HEPATIC FUNCTION PANEL
AG Ratio: 1.5 (calc) (ref 1.0–2.5)
ALT: 14 U/L (ref 6–29)
AST: 21 U/L (ref 10–35)
Albumin: 4.3 g/dL (ref 3.6–5.1)
Alkaline phosphatase (APISO): 63 U/L (ref 37–153)
Bilirubin, Direct: 0.2 mg/dL (ref 0.0–0.2)
Globulin: 2.9 g/dL (calc) (ref 1.9–3.7)
Indirect Bilirubin: 0.4 mg/dL (calc) (ref 0.2–1.2)
Total Bilirubin: 0.6 mg/dL (ref 0.2–1.2)
Total Protein: 7.2 g/dL (ref 6.1–8.1)

## 2020-02-27 LAB — HEMOGLOBIN A1C
Hgb A1c MFr Bld: 6.1 % of total Hgb — ABNORMAL HIGH (ref <5.7)
Mean Plasma Glucose: 128 (calc)
eAG (mmol/L): 7.1 (calc)

## 2020-03-07 ENCOUNTER — Encounter: Payer: Self-pay | Admitting: Internal Medicine

## 2020-03-07 ENCOUNTER — Other Ambulatory Visit: Payer: Self-pay

## 2020-03-07 ENCOUNTER — Ambulatory Visit: Payer: Medicare HMO | Admitting: Internal Medicine

## 2020-03-07 VITALS — BP 132/82 | HR 93 | Ht 63.0 in | Wt 221.6 lb

## 2020-03-07 DIAGNOSIS — J683 Other acute and subacute respiratory conditions due to chemicals, gases, fumes and vapors: Secondary | ICD-10-CM | POA: Diagnosis not present

## 2020-03-07 DIAGNOSIS — R918 Other nonspecific abnormal finding of lung field: Secondary | ICD-10-CM | POA: Diagnosis not present

## 2020-03-07 DIAGNOSIS — J45909 Unspecified asthma, uncomplicated: Secondary | ICD-10-CM | POA: Diagnosis not present

## 2020-03-07 NOTE — Addendum Note (Signed)
Addended by: Lorretta Harp on: 03/07/2020 12:11 PM   Modules accepted: Orders
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
## 2020-03-07 NOTE — Patient Instructions (Addendum)
ICD-10-CM      ICD-10-CM   1. Reactive airways dysfunction syndrome (HCC)  J68.3   2. Multiple lung nodules on CT  R91.8     Reactive airways dysfunction syndrome, moderate persistent, uncomplicated (HCC)  - glad stable clinically  Plan  -Do spirometry pre and postbronchodilator response in the next 3-6 months - continue breo but add spiriva scheduled -Continue albuterol as needed  Multiple lung nodules on CT -Resolved as of January 2020 and no further follow-up    Followup 3-6 months do spirometry pre and postbronchodilator and then return for follow-up Return sooner if needed

## 2020-03-18 MED FILL — BREO ELLIPTA 100-25 MCG INH: 100-25 | 30 days supply | Qty: 60 | Fill #4

## 2020-03-18 MED FILL — SPIRIVA RESPIMAT 2.5 MCG IN: 2.5 | 30 days supply | Qty: 4 | Fill #4

## 2020-03-31 ENCOUNTER — Other Ambulatory Visit: Payer: Self-pay

## 2020-03-31 ENCOUNTER — Ambulatory Visit: Payer: Medicare HMO | Admitting: Pharmacist

## 2020-03-31 DIAGNOSIS — I10 Essential (primary) hypertension: Secondary | ICD-10-CM

## 2020-03-31 DIAGNOSIS — E785 Hyperlipidemia, unspecified: Secondary | ICD-10-CM

## 2020-03-31 DIAGNOSIS — E118 Type 2 diabetes mellitus with unspecified complications: Secondary | ICD-10-CM

## 2020-03-31 NOTE — Chronic Care Management (AMB) (Addendum)
Chronic Care Management Pharmacy  Name: Suzanne Ali  MRN: [id number] DOB: August 10, 1951  Chief Complaint/ HPI  Suzanne Ali,  69 y.o. , female presents for their Follow-Up CCM visit with the clinical pharmacist In office (although scheduled as a brief follow up telephone call to assess OTC med recommendation).  PCP : Sandford Craze, NP  Their chronic conditions include: Asthma, HTN, HLD, Hx of CVA, DM, Pain, Rash  Office Visits: None since last CCM visit on 02/26/20.    Consult Visit: 03/07/20: Pulmonary visit w/ Dr. Marchelle Gearing - Spirometry in next 3-6 months. Continue Breo and Spiriva scheduled and Albuterol PRN.   Medications: Outpatient Encounter Medications as of 03/31/2020  Medication Sig Note  . acetaminophen (TYLENOL) 325 MG tablet Take 650 mg by mouth every 6 (six) hours as needed for mild pain, moderate pain or headache.   . albuterol (PROAIR HFA) 108 (90 Base) MCG/ACT inhaler Inhale 2 puffs into the lungs every 6 (six) hours as needed for wheezing or shortness of breath.   Marland Kitchen aspirin EC 81 MG tablet Take 1 tablet (81 mg total) by mouth daily.   . betamethasone dipropionate (DIPROLENE) 0.05 % cream Apply topically 2 (two) times daily.   . fluticasone furoate-vilanterol (BREO ELLIPTA) 100-25 MCG/INH AEPB INHALE 1 PUFF BY MOUTH INTO THE LUNGS DAILY.   . hydrochlorothiazide (MICROZIDE) 12.5 MG capsule Take 1 capsule (12.5 mg total) by mouth daily.   . rosuvastatin (CRESTOR) 40 MG tablet TAKE 1 TABLET (40 MG TOTAL) BY MOUTH DAILY. 02/26/2020: Now taking whole tablet  . Tiotropium Bromide Monohydrate (SPIRIVA RESPIMAT) 2.5 MCG/ACT AERS Inhale 2 puffs into the lungs daily.    Facility-Administered Encounter Medications as of 03/31/2020  Medication  . 0.9 %  sodium chloride infusion  . 0.9 %  sodium chloride infusion   Immunization History  Administered Date(s) Administered  . Fluad Quad(high Dose 65+) 10/06/2019  . Influenza, High Dose Seasonal PF 07/23/2016,  08/26/2017, 10/01/2018  . Influenza,inj,Quad PF,6+ Mos 10/23/2013, 09/20/2014, 07/25/2015  . Moderna SARS-COVID-2 Vaccination 12/16/2019, 01/18/2020  . Pneumococcal Conjugate-13 07/25/2015  . Pneumococcal Polysaccharide-23 01/23/2017  . Tdap 06/22/Suzanne Ali   Received both Covid vaccines. Received 2nd Covid vaccine on 01/19/2020  States she developed a rash after the second Covid vaccine. It is now cleared  SDOH Screenings   Alcohol Screen:   . Last Alcohol Screening Score (AUDIT):   Depression (PHQ2-9): Low Risk   . PHQ-2 Score: 0  Financial Resource Strain: Low Risk   . Difficulty of Paying Living Expenses: Not very hard  Food Insecurity:   . Worried About Programme researcher, broadcasting/film/video in the Last Year:   . [employer] of Food in the Last Year:   Housing:   . Last Housing Risk Score:   Physical Activity:   . Days of Exercise per Week:   . Minutes of Exercise per Session:   Social Connections:   . Frequency of Communication with Friends and Family:   . Frequency of Social Gatherings with Friends and Family:   . Attends Religious Services:   . Active Member of Clubs or Organizations:   . Attends Banker Meetings:   Marland Kitchen Marital Status:   Stress:   . Feeling of Stress :   Tobacco Use: Low Risk   . Smoking Tobacco Use: Never Smoker  . Smokeless Tobacco Use: Never Used  Transportation Needs:   . Freight forwarder (Medical):   Marland Kitchen Lack of Transportation (Non-Medical):      Current  Diagnosis/Assessment:  Goals Addressed            This Visit's Progress   . Pharmacy Care Plan       CARE PLAN ENTRY  Current Barriers:  . Chronic Disease Management support, education, and care coordination needs related to Asthma, HTN, HLD, Hx of CVA, DM, Pain, Rash   Hypertension . Pharmacist Clinical Goal(s): o Over the next 180 days, patient will work with PharmD and providers to maintain BP goal <140/90 . Current regimen:  o  Hctz 12.5mg  daily  . Interventions: o Requested patient  check blood pressure 2-3 times per week and record . Patient self care activities - Over the next 180 days, patient will: o Check BP 2-3 times per week, document, and provide at future appointments o Ensure daily salt intake < 2300 mg/Suzanne Ali  Hyperlipidemia . Pharmacist Clinical Goal(s): o Over the next 180 days, patient will work with PharmD and providers to achieve LDL goal <70 . Current regimen:  o Rosuvastatin 40mg  daily . Interventions: o Confirmed that patient should take a full tablet of rosuvastatin 40mg  versus half tablet . Patient self care activities - Over the next 180 days, patient will: o Maintain cholesterol medication regimen.   Diabetes . Pharmacist Clinical Goal(s): o Over the next 180 days, patient will work with PharmD and providers to maintain A1c goal <7% . Current regimen:  o Diet and exercise management   . Patient self care activities - Over the next 180 days, patient will: o Maintain diabetes control with diet and exercise o Contact provider with any episodes of hypoglycemia  Medication management . Pharmacist Clinical Goal(s): o Over the next 180 days, patient will work with PharmD and providers to maintain optimal medication adherence . [facility] . Interventions o Comprehensive medication review performed. o Continue current medication management strategy . Patient self care activities - Over the next 180 days, patient will: o Focus on medication adherence by filling medications appropriately   o Take medications as prescribed o Report any questions or concerns to PharmD and/or provider(s)  Please see past updates related to this goal by clicking on the "Past Updates" button in the selected goal        Social Hx:  Waiting to get a new mobile home. Currently living with son in [place].  Has 3 children.  Has 6 grandchildren.    Hypertension   CMP Latest Ref Rng & Units 02/26/2020 02/19/2020 01/22/2020    Glucose 65 - 99 mg/dL - 952(W) 90  BUN 8 - 27 mg/dL - 24 41(L)  Creatinine 0.57 - 1.00 mg/dL - 2.44(W) 1.02(V)  Sodium 134 - 144 mmol/L - 143 137  Potassium 3.5 - 5.2 mmol/L - 4.1 4.1  Chloride 96 - 106 mmol/L - 104 101  CO2 20 - 29 mmol/L - 21 20  Calcium 8.7 - 10.3 mg/dL - 9.4 9.5  Total Protein 6.1 - 8.1 g/dL 7.2 - -  Total Bilirubin 0.2 - 1.2 mg/dL 0.6 - -  Alkaline Phos 38 - 126 U/L - - -  AST 10 - 35 U/L 21 - -  ALT 6 - 29 U/L 14 - -  GFR      41   >60   50.50   BP today is: As noted below (>140/90)  Office blood pressures are  BP Readings from Last 3 Encounters:  03/07/20 132/82  02/26/20 (!) 152/90  01/22/20 130/82   BP goal <140/90  Patient has failed  these meds in the past: ACEI (angioedema), benicar (hair loss, dry skin) Patient is currently controlled on the following medications: hctz 12.5mg  daily (was recently decreased per cardio)  Patient checks BP at home 3-5x per week   Patient home BP readings are ranging:  132 83 134 85 140 80 124 85 136 86  124 85 139 88 Average 132.7 84.5  Plan -Check BP 2-3 times per week and record readings -Continue current medications     Hyperlipidemia   Lipid Panel     Component Value Date/Time   CHOL 142 01/22/2020 1037   TRIG 68 01/22/2020 1037   HDL 56 01/22/2020 1037   CHOLHDL 2.5 01/22/2020 1037   CHOLHDL 3 10/01/2018 0904   VLDL 16.2 10/01/2018 0904   LDLCALC 72 01/22/2020 1037   LABVLDL 14 01/22/2020 1037    LDL goal <70  The 10-year ASCVD risk score Suzanne George DC Jr., Suzanne al., Suzanne Ali) is: 21%   Values used to calculate the score:     Age: 76 years     Sex: Female     Is Non-Hispanic African American: Yes     Diabetic: Yes     Tobacco smoker: No     Systolic Blood Pressure: 132 mmHg     Is BP treated: Yes     HDL Cholesterol: 56 mg/dL     Total Cholesterol: 142 mg/dL   Patient has failed these meds in past: atorvastatin (inefficacy) Patient is currently controlled on the following medications:  rosuvastatin 40mg  daily  Still taking full tablet of rosuvastatin as prescribed  We discussed:  Importance of med adherence.   Plan -Continue current medications    Hx of CVA    Patient has failed these meds in past: atorvastatin (inefficacy) Patient is currently controlled on the following medications:  rosuvastatin 40mg  daily, aspirin 81mg  daily  Plan -Continue current medications   Diabetes   Recent Relevant Labs: Lab Results  Component Value Date/Time   HGBA1C 6.1 (H) 02/26/2020 03:54 PM   HGBA1C 6.2 10/01/2018 09:04 AM   MICROALBUR 4.5 (H) 10/24/2016 11:52 AM     Checking BG: Never Patient is currently controlled on the following medications: None  Last diabetic Eye exam: No results found for: HMDIABEYEEXA  Last diabetic Foot exam: No results found for: HMDIABFOOTEX   Congratulated patient on steady control with diabetes by managing diet and exercise  Plan -Continue control with diet and exercise

## 2020-03-31 NOTE — Patient Instructions (Signed)
Visit Information  Goals Addressed            This Visit's Progress   . Pharmacy Care Plan       CARE PLAN ENTRY  Current Barriers:  . Chronic Disease Management support, education, and care coordination needs related to Asthma, HTN, HLD, Hx of CVA, DM, Pain, Rash   Hypertension . Pharmacist Clinical Goal(s): o Over the next 180 days, patient will work with PharmD and providers to maintain BP goal <140/90 . Current regimen:  o  Hctz 12.5mg  daily  . Interventions: o Requested patient check blood pressure 2-3 times per week and record . Patient self care activities - Over the next 180 days, patient will: o Check BP 2-3 times per week, document, and provide at future appointments o Ensure daily salt intake < 2300 mg/Shalina Norfolk  Hyperlipidemia . Pharmacist Clinical Goal(s): o Over the next 180 days, patient will work with PharmD and providers to achieve LDL goal <70 . Current regimen:  o Rosuvastatin 40mg  daily . Interventions: o Confirmed that patient should take a full tablet of rosuvastatin 40mg  versus half tablet . Patient self care activities - Over the next 180 days, patient will: o Maintain cholesterol medication regimen.   Diabetes . Pharmacist Clinical Goal(s): o Over the next 180 days, patient will work with PharmD and providers to maintain A1c goal <7% . Current regimen:  o Diet and exercise management   . Patient self care activities - Over the next 180 days, patient will: o Maintain diabetes control with diet and exercise o Contact provider with any episodes of hypoglycemia  Medication management . Pharmacist Clinical Goal(s): o Over the next 180 days, patient will work with PharmD and providers to maintain optimal medication adherence . Current pharmacy: [facility] . Interventions o Comprehensive medication review performed. o Continue current medication management strategy . Patient self care activities - Over the next 180 days, patient  will: o Focus on medication adherence by filling medications appropriately   o Take medications as prescribed o Report any questions or concerns to PharmD and/or provider(s)  Please see past updates related to this goal by clicking on the "Past Updates" button in the selected goal         The patient verbalized understanding of instructions provided today and agreed to receive a mailed copy of patient instruction and/or educational materials.  Telephone follow up appointment with pharmacy team member scheduled for: 08/10/2020  Melvenia Beam Skylee Baird, PharmD Clinical Pharmacist [facility] [phone]

## 2020-04-07 ENCOUNTER — Other Ambulatory Visit: Payer: Self-pay | Admitting: Family

## 2020-04-08 ENCOUNTER — Other Ambulatory Visit: Payer: Self-pay | Admitting: Family

## 2020-04-08 MED FILL — ROSUVASTATIN CALCIUM 40 MG: 40 | 30 days supply | Qty: 30 | Fill #0

## 2020-04-08 NOTE — Telephone Encounter (Signed)
Melissa -- please advise refill requests and next follow up.  Pt last seen by you 07/06/19 and has no future appts scheduled with you.

## 2020-04-08 NOTE — Telephone Encounter (Signed)
Please contact pt to schedule a follow up visit.  

## 2020-04-08 NOTE — Telephone Encounter (Signed)
Melissa -- I only sent a 30 day supply of crestor to pt's pharmacy. She has not seen you since 07/06/19.  When is she due for next follow up with you?

## 2020-04-12 NOTE — Telephone Encounter (Signed)
Patient was scheduled for tomorrow at 9:40 am

## 2020-04-13 ENCOUNTER — Ambulatory Visit (INDEPENDENT_AMBULATORY_CARE_PROVIDER_SITE_OTHER): Payer: Medicare HMO | Admitting: Family

## 2020-04-13 ENCOUNTER — Other Ambulatory Visit: Payer: Self-pay

## 2020-04-13 ENCOUNTER — Encounter: Payer: Self-pay | Admitting: Family

## 2020-04-13 VITALS — BP 144/84 | HR 81 | Temp 98.2°F | Resp 16 | Ht 63.0 in | Wt 225.0 lb

## 2020-04-13 DIAGNOSIS — E785 Hyperlipidemia, unspecified: Secondary | ICD-10-CM | POA: Diagnosis not present

## 2020-04-13 DIAGNOSIS — E119 Type 2 diabetes mellitus without complications: Secondary | ICD-10-CM

## 2020-04-13 DIAGNOSIS — I10 Essential (primary) hypertension: Secondary | ICD-10-CM | POA: Diagnosis not present

## 2020-04-13 DIAGNOSIS — J45909 Unspecified asthma, uncomplicated: Secondary | ICD-10-CM

## 2020-04-13 DIAGNOSIS — Z0000 Encounter for general adult medical examination without abnormal findings: Secondary | ICD-10-CM

## 2020-04-13 DIAGNOSIS — Z1159 Encounter for screening for other viral diseases: Secondary | ICD-10-CM

## 2020-04-13 LAB — HEPATITIS C ANTIBODY
Hepatitis C Ab: NONREACTIVE
SIGNAL TO CUT-OFF: 0.08 (ref <1.00)

## 2020-04-13 MED ORDER — SHINGRIX 50 MCG/0.5ML IM SUSR: INTRAMUSCULAR | 1 refills | Status: DC

## 2020-04-13 MED ORDER — SPIRIVA RESPIMAT 2.5 MCG/ACT IN AERS: INHALATION_SPRAY | RESPIRATORY_TRACT | 5 refills | Status: DC

## 2020-04-13 MED ORDER — ROSUVASTATIN CALCIUM 40 MG PO TABS: 40.0000 mg | ORAL_TABLET | Freq: Every day | ORAL | 1 refills | Status: DC

## 2020-04-13 MED ORDER — BREO ELLIPTA 100-25 MCG/INH IN AEPB: INHALATION_SPRAY | RESPIRATORY_TRACT | 5 refills | Status: DC

## 2020-04-13 MED FILL — BREO ELLIPTA 100-25 MCG INH: 100-25 | 30 days supply | Qty: 60 | Fill #0

## 2020-04-13 MED FILL — SPIRIVA RESPIMAT 2.5 MCG IN: 2.5 | 30 days supply | Qty: 4 | Fill #0
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
## 2020-04-13 NOTE — Patient Instructions (Signed)
Please complete lab work prior to leaving.   

## 2020-04-14 ENCOUNTER — Encounter: Payer: Self-pay | Admitting: Family

## 2020-04-14 NOTE — Addendum Note (Signed)
Addended by: Kelle Darting A on: 04/14/2020 04:14 PM   Modules accepted: Orders

## 2020-04-18 ENCOUNTER — Other Ambulatory Visit (INDEPENDENT_AMBULATORY_CARE_PROVIDER_SITE_OTHER): Payer: Medicare HMO

## 2020-04-18 ENCOUNTER — Ambulatory Visit (HOSPITAL_BASED_OUTPATIENT_CLINIC_OR_DEPARTMENT_OTHER)
Admission: RE | Admit: 2020-04-18 | Discharge: 2020-04-18 | Disposition: A | Discharge status: Home / Self Care | Payer: Medicare HMO | Source: Ambulatory Visit | Attending: Family | Admitting: Family

## 2020-04-18 ENCOUNTER — Other Ambulatory Visit: Payer: Self-pay

## 2020-04-18 ENCOUNTER — Encounter (HOSPITAL_BASED_OUTPATIENT_CLINIC_OR_DEPARTMENT_OTHER): Payer: Self-pay

## 2020-04-18 DIAGNOSIS — Z0000 Encounter for general adult medical examination without abnormal findings: Secondary | ICD-10-CM

## 2020-04-18 DIAGNOSIS — E119 Type 2 diabetes mellitus without complications: Secondary | ICD-10-CM | POA: Diagnosis not present

## 2020-04-18 DIAGNOSIS — Z1231 Encounter for screening mammogram for malignant neoplasm of breast: Secondary | ICD-10-CM | POA: Insufficient documentation

## 2020-04-18 LAB — MICROALBUMIN / CREATININE URINE RATIO
Creatinine,U: 69.3 mg/dL
Microalb Creat Ratio: 1 mg/g (ref 0.0–30.0)
Microalb, Ur: <0.7 mg/dL (ref 0.0–1.9)

## 2020-04-18 NOTE — Progress Notes (Signed)
No charge. Pt was unable to give urine sample at last visit and brought specimen back today.

## 2020-04-18 NOTE — Progress Notes (Signed)
Mailed out to pt 

## 2020-04-21 ENCOUNTER — Ambulatory Visit: Payer: Self-pay | Admitting: Pharmacist

## 2020-04-21 NOTE — Chronic Care Management (AMB) (Signed)
Patient called me and I returned her call.   She states that I called her yesterday, but I did not. She wonders if it is the pharmacy downstairs that called. She states she will call downstairs and confirm. I see she may have had refills sent at her last office visit, so this could have been a pharmacy call informing her she had prescriptions ready for pick up downstairs.   She also asks about the results of her mammogram. Informed her that the results state there is no evidence of malignancy and that she is due for follow up in 1 year. Also informed patient she should receive a letter in the mail stating the same. She verbalized understanding.   Encouraged patient to call me back if she has any further questions or concerns.

## 2020-05-13 MED FILL — SPIRIVA RESPIMAT 2.5 MCG IN: 2.5 | 30 days supply | Qty: 4 | Fill #1

## 2020-05-13 MED FILL — BREO ELLIPTA 100-25 MCG INH: 100-25 | 30 days supply | Qty: 60 | Fill #1

## 2020-05-13 MED FILL — ROSUVASTATIN CALCIUM 40 MG: 40 | 90 days supply | Qty: 90 | Fill #0

## 2020-05-24 LAB — HM DIABETES EYE EXAM

## 2020-06-07 ENCOUNTER — Other Ambulatory Visit (HOSPITAL_COMMUNITY)
Admission: RE | Admit: 2020-06-07 | Discharge: 2020-06-07 | Disposition: A | Discharge status: Home / Self Care | Payer: Medicare HMO | Source: Ambulatory Visit | Attending: Internal Medicine | Admitting: Internal Medicine

## 2020-06-07 DIAGNOSIS — Z01812 Encounter for preprocedural laboratory examination: Secondary | ICD-10-CM | POA: Diagnosis present

## 2020-06-07 DIAGNOSIS — Z20822 Contact with and (suspected) exposure to covid-19: Secondary | ICD-10-CM | POA: Diagnosis not present

## 2020-06-07 LAB — SARS CORONAVIRUS 2 (TAT 6-24 HRS): SARS Coronavirus 2: NEGATIVE

## 2020-06-10 ENCOUNTER — Other Ambulatory Visit: Payer: Self-pay

## 2020-06-10 ENCOUNTER — Other Ambulatory Visit: Payer: Medicare HMO

## 2020-06-10 ENCOUNTER — Ambulatory Visit (INDEPENDENT_AMBULATORY_CARE_PROVIDER_SITE_OTHER): Payer: Medicare HMO

## 2020-06-10 DIAGNOSIS — J683 Other acute and subacute respiratory conditions due to chemicals, gases, fumes and vapors: Secondary | ICD-10-CM

## 2020-06-10 NOTE — Progress Notes (Signed)
Test performed at 4:05 pm post bronchodilator, had computer program malfunction.

## 2020-07-19 MED FILL — BREO ELLIPTA 100-25 MCG INH: 100-25 | 30 days supply | Qty: 60 | Fill #2

## 2020-07-19 MED FILL — SPIRIVA RESPIMAT 2.5 MCG IN: 2.5 | 30 days supply | Qty: 4 | Fill #2

## 2020-07-26 MED FILL — HYDROCHLOROTHIAZIDE 12.5 MG: 12.5 | 90 days supply | Qty: 90 | Fill #2

## 2020-07-27 ENCOUNTER — Other Ambulatory Visit: Payer: Self-pay

## 2020-07-27 ENCOUNTER — Other Ambulatory Visit (HOSPITAL_BASED_OUTPATIENT_CLINIC_OR_DEPARTMENT_OTHER): Payer: Self-pay | Admitting: Internal Medicine

## 2020-07-27 ENCOUNTER — Encounter: Payer: Self-pay | Admitting: Internal Medicine

## 2020-07-27 ENCOUNTER — Ambulatory Visit: Payer: Medicare HMO | Admitting: Internal Medicine

## 2020-07-27 VITALS — BP 130/90 | HR 101 | Temp 98.6°F | Ht 64.0 in | Wt 228.2 lb

## 2020-07-27 DIAGNOSIS — Z7189 Other specified counseling: Secondary | ICD-10-CM

## 2020-07-27 DIAGNOSIS — Z6839 Body mass index (BMI) 39.0-39.9, adult: Secondary | ICD-10-CM | POA: Diagnosis not present

## 2020-07-27 DIAGNOSIS — R0683 Snoring: Secondary | ICD-10-CM

## 2020-07-27 DIAGNOSIS — J4520 Mild intermittent asthma, uncomplicated: Secondary | ICD-10-CM | POA: Diagnosis not present

## 2020-07-27 DIAGNOSIS — Z87898 Personal history of other specified conditions: Secondary | ICD-10-CM

## 2020-07-27 DIAGNOSIS — Z7185 Encounter for immunization safety counseling: Secondary | ICD-10-CM

## 2020-07-27 DIAGNOSIS — J683 Other acute and subacute respiratory conditions due to chemicals, gases, fumes and vapors: Secondary | ICD-10-CM

## 2020-07-27 MED ORDER — TRELEGY ELLIPTA 100-62.5-25 MCG/INH IN AEPB
1.0000 | INHALATION_SPRAY | Freq: Every day | RESPIRATORY_TRACT | 3 refills | Status: DC
Start: 2020-07-27 — End: 2020-12-01

## 2020-07-27 MED FILL — TRELEGY ELLIPTA 100-62.5-25: 100-62.5-25 | 30 days supply | Qty: 60 | Fill #0
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
## 2020-07-27 NOTE — Addendum Note (Signed)
Addended by: Vanessa Barbara on: 07/27/2020 10:33 AM   Modules accepted: Orders

## 2020-07-27 NOTE — Patient Instructions (Addendum)
  Reactive airways dysfunction syndrome, moderate persistent, uncomplicated (HCC)  - glad stable clinically - I think you can go down on one of her inhalers but I respect your desire to take Spiriva and Breo -In fact for your convenience we can see if you qualify for Trelegy   Plan  -Start Trelegy once daily instead of Breo and Spiriva -Continue albuterol as needed  High risk of sleep apnea  -Because of your weight, high blood pressure and snoring history and your age -there is a fair chance that you have sleep apnea  Plan -Refer to one of sleep specialist in our office  Vaccine counseling  -Recommend high-dose flu shot when available   Followup -Refer sleep doctor -Return to see me Dr. Chase Caller 9 months for 15-minute follow-up

## 2020-08-05 ENCOUNTER — Other Ambulatory Visit (HOSPITAL_BASED_OUTPATIENT_CLINIC_OR_DEPARTMENT_OTHER): Payer: Self-pay | Admitting: Optometry

## 2020-08-05 MED FILL — LATANOPROST 0.005% OPTH SOL: 0.005 | 25 days supply | Qty: 3 | Fill #0

## 2020-08-09 MED FILL — ROSUVASTATIN CALCIUM 40 MG: 40 | 90 days supply | Qty: 90 | Fill #1

## 2020-08-10 ENCOUNTER — Ambulatory Visit: Payer: Medicare HMO | Admitting: Pharmacist

## 2020-08-10 DIAGNOSIS — Z0000 Encounter for general adult medical examination without abnormal findings: Secondary | ICD-10-CM

## 2020-08-10 DIAGNOSIS — I10 Essential (primary) hypertension: Secondary | ICD-10-CM

## 2020-08-10 DIAGNOSIS — E119 Type 2 diabetes mellitus without complications: Secondary | ICD-10-CM

## 2020-08-10 DIAGNOSIS — E785 Hyperlipidemia, unspecified: Secondary | ICD-10-CM

## 2020-08-10 NOTE — Patient Instructions (Signed)
Visit Information  Goals Addressed            This Visit's Progress    Chronic Care Management Pharmacy Care Plan       CARE PLAN ENTRY (see longitudinal plan of care for additional care plan information)  Current Barriers:   Chronic Disease Management support, education, and care coordination needs related to Reactive Airway Disease, HTN, HLD, Hx of CVA, DM, Pain, Rash   Hypertension BP Readings from Last 3 Encounters:  07/27/20 130/90  04/13/20 (!) 144/84  03/07/20 132/82    Pharmacist Clinical Goal(s): o Over the next 180 days, patient will work with PharmD and providers to maintain BP goal <140/90  Current regimen:  o HCTZ 12.5mg  daily  Patient self care activities - Over the next 180 days, patient will: o Maintain hypertension medication regimen.   Hyperlipidemia/Hx of CVA Lab Results  Component Value Date/Time   LDLCALC 72 01/22/2020 10:37 AM    Pharmacist Clinical Goal(s): o Over the next 180 days, patient will work with PharmD and providers to achieve LDL goal < 70  Current regimen:   Rosuvastatin 40mg  daily  Aspirin 81mg  daily  Interventions: o Updated lipid panel at next visit  Patient self care activities - Over the next 180 days, patient will: o Maintain cholesterol medication regimen. o Complete updated lipid panel  Diabetes Lab Results  Component Value Date/Time   HGBA1C 6.1 (H) 02/26/2020 03:54 PM   HGBA1C 6.2 10/01/2018 09:04 AM    Pharmacist Clinical Goal(s): o Over the next 180 days, patient will work with PharmD and providers to maintain A1c goal <7%  Current regimen:  o Diet and exercise management    Patient self care activities - Over the next 180 days, patient will: o Maintain a1c <7%  Reactive Airway Disease  Pharmacist Clinical Goal(s) o Over the next 180 days, patient will work with PharmD and providers to reduce symptoms associated with reactive airway disease  Current regimen:  o Trelegy 1 puff daily  Patient  self care activities - Over the next 180 days, patient will: o Maintain reactive airway disease medication regimen  Health Maintenance   Pharmacist Clinical Goal(s) o Over the next 180 days, patient will work with PharmD and providers to complete health maintenance screenings/vaccinations  Interventions: o Discussed flu vaccine o Discussed Shingrix vaccine  Patient self care activities - Over the next 180 days, patient will: o Receive flu vaccine o Complete Shingrix vaccine series  Medication management  Pharmacist Clinical Goal(s): o Over the next 180 days, patient will work with PharmD and providers to maintain optimal medication adherence  Current pharmacy: [facility]  Interventions o Comprehensive medication review performed. o Continue current medication management strategy  Patient self care activities - Over the next 180 days, patient will: o Focus on medication adherence by filling and taking medications appropriately  o Take medications as prescribed o Report any questions or concerns to PharmD and/or provider(s)  Please see past updates related to this goal by clicking on the "Past Updates" button in the selected goal        The patient verbalized understanding of instructions provided today and agreed to receive a mailed copy of patient instruction and/or educational materials.  Telephone follow up appointment with pharmacy team member scheduled for: 02/07/2021  Melvenia Beam Quentina Fronek, PharmD Clinical Pharmacist [facility] [phone]

## 2020-08-10 NOTE — Chronic Care Management (AMB) (Signed)
Chronic Care Management Pharmacy  Name: Suzanne Ali  MRN: [id number] DOB: 1950-11-18  Chief Complaint/ HPI  Suzanne Ali,  69 y.o. , female presents for their Follow-Up CCM visit with the clinical pharmacist via telephone due to COVID-19 Pandemic   PCP : Sandford Craze, NP  Their chronic conditions include: Reactive Airway Disease, HTN, HLD, Hx of CVA, DM, Pain, Rash  Office Visits: None since last CCM visit on 02/26/20.    Consult Visit: 07/27/20: Pulmonary visit w/ Dr. Marchelle Gearing - Start Trelegy daily and albuterol as needed  Medications: Outpatient Encounter Medications as of 08/10/2020  Medication Sig Note  . latanoprost (XALATAN) 0.005 % ophthalmic solution Place 1 drop into both eyes at bedtime. Started on 08/05/20 per patient   . acetaminophen (TYLENOL) 325 MG tablet Take 650 mg by mouth every 6 (six) hours as needed for mild pain, moderate pain or headache.   . albuterol (PROAIR HFA) 108 (90 Base) MCG/ACT inhaler Inhale 2 puffs into the lungs every 6 (six) hours as needed for wheezing or shortness of breath.   Marland Kitchen aspirin EC 81 MG tablet Take 1 tablet (81 mg total) by mouth daily.   . fluticasone furoate-vilanterol (BREO ELLIPTA) 100-25 MCG/INH AEPB INHALE 1 PUFF BY MOUTH INTO THE LUNGS DAILY. 08/10/2020: Switched to Trelegy at 9/15 visit with pulmonology  . Fluticasone-Umeclidin-Vilant (TRELEGY ELLIPTA) 100-62.5-25 MCG/INH AEPB Inhale 1 puff into the lungs daily.   . hydrochlorothiazide (MICROZIDE) 12.5 MG capsule Take 1 capsule (12.5 mg total) by mouth daily.   . rosuvastatin (CRESTOR) 40 MG tablet Take 1 tablet (40 mg total) by mouth daily.   . Tiotropium Bromide Monohydrate (SPIRIVA RESPIMAT) 2.5 MCG/ACT AERS INHALE 2 PUFFS INTO THE LUNGS DAILY. 08/10/2020: Switched to Trelegy at 07/27/20 Pulmonary visit  . Zoster Vaccine Adjuvanted [place]) injection Inject 0.5mg  IM now and again in 2-6 months.    Facility-Administered Encounter Medications as of 08/10/2020    Medication  . 0.9 %  sodium chloride infusion  . 0.9 %  sodium chloride infusion   Immunization History  Administered Date(s) Administered  . Fluad Quad(high Dose 65+) 10/06/2019  . Influenza, High Dose Seasonal PF 07/23/2016, 08/26/2017, 10/01/2018  . Influenza,inj,Quad PF,6+ Mos 10/23/2013, 09/20/2014, 07/25/2015  . Moderna SARS-COVID-2 Vaccination 12/16/2019, 01/18/2020  . Pneumococcal Conjugate-13 07/25/2015  . Pneumococcal Polysaccharide-23 01/23/2017  . Tdap 05/03/2012   Received both Covid vaccines. Received 2nd Covid vaccine on 01/19/2020  States she developed a rash after the second Covid vaccine. It is now cleared  SDOH Screenings   Alcohol Screen:   . Last Alcohol Screening Score (AUDIT): Not on file  Depression (PHQ2-9): Low Risk   . PHQ-2 Score: 0  Financial Resource Strain: Low Risk   . Difficulty of Paying Living Expenses: Not very hard  Food Insecurity:   . Worried About Programme researcher, broadcasting/film/video in the Last Year: Not on file  . Ran Out of Food in the Last Year: Not on file  Housing:   . Last Housing Risk Score: Not on file  Physical Activity:   . Days of Exercise per Week: Not on file  . Minutes of Exercise per Session: Not on file  Social Connections:   . Frequency of Communication with Friends and Family: Not on file  . Frequency of Social Gatherings with Friends and Family: Not on file  . Attends Religious Services: Not on file  . Active Member of Clubs or Organizations: Not on file  . Attends Banker Meetings: Not  on file  . Marital Status: Not on file  Stress:   . Feeling of Stress : Not on file  Tobacco Use: Low Risk   . Smoking Tobacco Use: Never Smoker  . Smokeless Tobacco Use: Never Used  Transportation Needs:   . Freight forwarder (Medical): Not on file  . Lack of Transportation (Non-Medical): Not on file     Current Diagnosis/Assessment:  Goals Addressed            This Visit's Progress   . Chronic Care Management  Pharmacy Care Plan       CARE PLAN ENTRY (see longitudinal plan of care for additional care plan information)  Current Barriers:  . Chronic Disease Management support, education, and care coordination needs related to Reactive Airway Disease, HTN, HLD, Hx of CVA, DM, Pain, Rash   Hypertension BP Readings from Last 3 Encounters:  07/27/20 130/90  04/13/20 (!) 144/84  03/07/20 132/82   . Pharmacist Clinical Goal(s): o Over the next 180 days, patient will work with PharmD and providers to maintain BP goal <140/90 . Current regimen:  o HCTZ 12.5mg  daily . Patient self care activities - Over the next 180 days, patient will: o Maintain hypertension medication regimen.   Hyperlipidemia/Hx of CVA Lab Results  Component Value Date/Time   LDLCALC 72 01/22/2020 10:37 AM   . Pharmacist Clinical Goal(s): o Over the next 180 days, patient will work with PharmD and providers to achieve LDL goal < 70 . Current regimen:  . Rosuvastatin 40mg  daily . Aspirin 81mg  daily . Interventions: o Updated lipid panel at next visit . Patient self care activities - Over the next 180 days, patient will: o Maintain cholesterol medication regimen. o Complete updated lipid panel  Diabetes Lab Results  Component Value Date/Time   HGBA1C 6.1 (H) 02/26/2020 03:54 PM   HGBA1C 6.2 10/01/2018 09:04 AM   . Pharmacist Clinical Goal(s): o Over the next 180 days, patient will work with PharmD and providers to maintain A1c goal <7% . Current regimen:  o Diet and exercise management   . Patient self care activities - Over the next 180 days, patient will: o Maintain a1c <7%  Reactive Airway Disease . Pharmacist Clinical Goal(s) o Over the next 180 days, patient will work with PharmD and providers to reduce symptoms associated with reactive airway disease . Current regimen:  o Trelegy 1 puff daily . Patient self care activities - Over the next 180 days, patient will: o Maintain reactive airway disease  medication regimen  Health Maintenance  . Pharmacist Clinical Goal(s) o Over the next 180 days, patient will work with PharmD and providers to complete health maintenance screenings/vaccinations . Interventions: o Discussed flu vaccine o Discussed Shingrix vaccine . Patient self care activities - Over the next 180 days, patient will: o Receive flu vaccine o Complete Shingrix vaccine series  Medication management . Pharmacist Clinical Goal(s): o Over the next 180 days, patient will work with PharmD and providers to maintain optimal medication adherence . Current pharmacy: [facility] . Interventions o Comprehensive medication review performed. o Continue current medication management strategy . Patient self care activities - Over the next 180 days, patient will: o Focus on medication adherence by filling and taking medications appropriately  o Take medications as prescribed o Report any questions or concerns to PharmD and/or provider(s)  Please see past updates related to this goal by clicking on the "Past Updates" button in the selected goal  Social Hx:  Waiting to get a new mobile home. Currently living with son in [place].  Has 3 children.  Has 6 grandchildren.  Hypertension   BP goal is:  <140/90  Office blood pressures are  BP Readings from Last 3 Encounters:  07/27/20 130/90  04/13/20 (!) 144/84  03/07/20 132/82   Patient checks BP at home infrequently  Patient has failed these meds in the past: ACEI (angioedema), benicar (hair loss, dry skin) Patient is currently controlled on the following medications:  . HCTZ 12.5mg  daily  Update 03/31/20 Patient home BP readings are ranging: 132 83 134 85 140 80 124 85 136 86  124 85 139 88 Average 132.7 84.5  Update 08/10/20 136/83 last week. Denies chest pain or dizziness.    Plan -Continue current medications     Hyperlipidemia/Hx of CVA   LDL goal < 70  Lipid Panel     Component  Value Date/Time   CHOL 142 01/22/2020 1037   TRIG 68 01/22/2020 1037   HDL 56 01/22/2020 1037   LDLCALC 72 01/22/2020 1037    Hepatic Function Latest Ref Rng & Units 02/26/2020 11/16/2018 10/01/2018  Total Protein 6.1 - 8.1 g/dL 7.2 7.7 7.7  Albumin 3.5 - 5.0 g/dL - 4.0 4.3  AST 10 - 35 U/L 21 22 19   ALT 6 - 29 U/L 14 15 16   Alk Phosphatase 38 - 126 U/L - 54 58  Total Bilirubin 0.2 - 1.2 mg/dL 0.6 0.7 0.6  Bilirubin, Direct 0.0 - 0.2 mg/dL 0.2 - -     The 96-EAVW ASCVD risk score Denman George DC Jr., et al., 2013) is: 20.4%   Values used to calculate the score:     Age: 29 years     Sex: Female     Is Non-Hispanic African American: Yes     Diabetic: Yes     Tobacco smoker: No     Systolic Blood Pressure: 130 mmHg     Is BP treated: Yes     HDL Cholesterol: 56 mg/dL     Total Cholesterol: 142 mg/dL   Patient has failed these meds in past: atorvastatin (inefficacy) Patient is currently uncontrolled on the following medications:  . Rosuvastatin 40mg  daily . Aspirin 81mg  daily  Updated labs at next office visit   Plan -Continue current medications  -Repeat lipid panel at next visit    Diabetes   A1c goal <7%  Recent Relevant Labs: Lab Results  Component Value Date/Time   HGBA1C 6.1 (H) 02/26/2020 03:54 PM   HGBA1C 6.2 10/01/2018 09:04 AM   GFR 50.50 (L) 10/01/2018 09:04 AM   GFR 61.63 11/25/2017 09:12 AM   MICROALBUR <0.7 04/18/2020 09:16 AM   MICROALBUR 4.5 (H) 10/24/2016 11:52 AM    Last diabetic Eye exam:  Lab Results  Component Value Date/Time   HMDIABEYEEXA No Retinopathy 05/24/2020 12:00 AM    Last diabetic Foot exam: No results found for: HMDIABFOOTEX   Checking BG: Never  Patient has failed these meds in past: None noted  Patient is currently controlled on the following medications: . None   Plan -Continue control with diet and exercise  -Repeat a1c at next visit   Reactive Airway Disease/Tobacco   Last spirometry score: 06/10/20 FEV1: 1.3L  75% FVC: 1.7L 76% FEV1/FVC: 99%  "She has spirometry that shows stability" per 07/27/20 pulmonary note  Eosinophil count:   Lab Results  Component Value Date/Time   EOSPCT 9 11/16/2018 08:13 PM  %  Eos (Absolute):  Lab Results  Component Value Date/Time   EOSABS 0.8 (H) 11/16/2018 08:13 PM    Tobacco Status:  Social History   Tobacco Use  Smoking Status Never Smoker  Smokeless Tobacco Never Used    Patient has failed these meds in past: None noted (took [place] and Spiriva, but requested to transition to Trelegy) Patient is currently controlled on the following medications:  Trelegy 1 puff daily Using maintenance inhaler regularly? Yes Frequency of rescue inhaler use:  never  She feels less SOB since switching to Trelegy. Hasn't had to use albuterol inhaler recently. Last time inhaler was used was last year  Plan -Continue current medications   Glaucoma    Patient has failed these meds in past: None noted  Patient is currently controlled on the following medications: . Latanoprost 0.005% 1 drop each eye at bedtime  Follow up appt end of October.   Plan -Continue current medications    Vaccines   Reviewed and discussed patient's vaccination history.    Immunization History  Administered Date(s) Administered  . Fluad Quad(high Dose 65+) 10/06/2019  . Influenza, High Dose Seasonal PF 07/23/2016, 08/26/2017, 10/01/2018  . Influenza,inj,Quad PF,6+ Mos 10/23/2013, 09/20/2014, 07/25/2015  . Moderna SARS-COVID-2 Vaccination 12/16/2019, 01/18/2020  . Pneumococcal Conjugate-13 07/25/2015  . Pneumococcal Polysaccharide-23 01/23/2017  . Tdap 05/03/2012   States she received 1st Shingrix vaccine at [facility] on [address]. Confirmed via [facility] that patient received first dose of Shingrix on 04/13/20 at [facility] She is aware she is due for the second Shingrix vaccine.  Plan -Update immunization record -Recommended patient receive flu  vaccine -Recommended patient receive Shingrix vaccine in pharmacy.   Medication Management   Pt uses [facility] pharmacy for all medications Uses pill box? No - she takes all of her meds at one time during the Sabrina Ke[place] Pt endorses 100% compliance  We discussed: Current pharmacy is preferred with insurance plan and patient is satisfied with pharmacy services  Plan  Continue current medication management strategy   Follow up: 6 month phone visit  Katrinka Blazing, PharmD Clinical Pharmacist [facility] [phone]

## 2020-08-23 MED FILL — LATANOPROST 0.005% OPTH SOL: 0.005 | 25 days supply | Qty: 3 | Fill #1

## 2020-08-23 MED FILL — TRELEGY ELLIPTA 100-62.5-25: 100-62.5-25 | 30 days supply | Qty: 60 | Fill #1

## 2020-09-27 MED FILL — TRELEGY ELLIPTA 100-62.5-25: 100-62.5-25 | 30 days supply | Qty: 60 | Fill #2

## 2020-10-10 ENCOUNTER — Ambulatory Visit: Payer: Self-pay | Admitting: *Deleted

## 2020-10-11 MED FILL — LATANOPROST 0.005% OPTH SOL: 0.005 | 25 days supply | Qty: 3 | Fill #2

## 2020-10-20 ENCOUNTER — Ambulatory Visit: Payer: Self-pay

## 2020-10-21 ENCOUNTER — Telehealth: Payer: Self-pay | Admitting: Pharmacist

## 2020-10-21 NOTE — Progress Notes (Signed)
    Chronic Care Management Pharmacy Assistant   Name: RUBYE STROHMEYER  MRN: [id number] DOB: February 07, 1951  Reason for Encounter: Disease State  Patient Questions:  1.  Have you seen any other providers since your last visit? No  2.  Any changes in your medicines or health? No    PCP : [name], NP   Their chronic conditions include: Reactive Airway Disease, HTN, HLD, Hx of CVA, DM, Pain, Rash.  Office Visits: None since their CCM visit with the clinical pharmacist on 08-10-2020.  Consults: None since their CCM visit with the clinical pharmacist on 08-10-2020.  Allergies:   Allergies  Allergen Reactions  . Lisinopril Swelling    angioedema  . Benicar [Olmesartan]     hair loss, dry skin.  . Latex     Swelling/peeling skin  . Penicillins Hives  . Sulfa Antibiotics Hives    Medications: Outpatient Encounter Medications as of 10/21/2020  Medication Sig Note  . acetaminophen (TYLENOL) 325 MG tablet Take 650 mg by mouth every 6 (six) hours as needed for mild pain, moderate pain or headache.   . albuterol (PROAIR HFA) 108 (90 Base) MCG/ACT inhaler Inhale 2 puffs into the lungs every 6 (six) hours as needed for wheezing or shortness of breath.   Marland Kitchen aspirin EC 81 MG tablet Take 1 tablet (81 mg total) by mouth daily.   . fluticasone furoate-vilanterol (BREO ELLIPTA) 100-25 MCG/INH AEPB INHALE 1 PUFF BY MOUTH INTO THE LUNGS DAILY. 08/10/2020: Switched to Trelegy at 9/15 visit with pulmonology  . Fluticasone-Umeclidin-Vilant (TRELEGY ELLIPTA) 100-62.5-25 MCG/INH AEPB Inhale 1 puff into the lungs daily.   . hydrochlorothiazide (MICROZIDE) 12.5 MG capsule Take 1 capsule (12.5 mg total) by mouth daily.   Marland Kitchen latanoprost (XALATAN) 0.005 % ophthalmic solution Place 1 drop into both eyes at bedtime. Started on 08/05/20 per patient   . rosuvastatin (CRESTOR) 40 MG tablet Take 1 tablet (40 mg total) by mouth daily.   . Tiotropium Bromide Monohydrate (SPIRIVA RESPIMAT) 2.5 MCG/ACT AERS  INHALE 2 PUFFS INTO THE LUNGS DAILY. 08/10/2020: Switched to Trelegy at 07/27/20 Pulmonary visit  . Zoster Vaccine Adjuvanted [facility]) injection Inject 0.5mg  IM now and again in 2-6 months.    Facility-Administered Encounter Medications as of 10/21/2020  Medication  . 0.9 %  sodium chloride infusion  . 0.9 %  sodium chloride infusion    Current Diagnosis: Patient Active Problem List   Diagnosis Date Noted  . Diabetes mellitus type 2, controlled, without complications ([place]) 74/25/9563  . Multiple lung nodules on CT 10/27/2015  . Syncope 10/18/2015  . Reactive airways dysfunction syndrome, moderate persistent, uncomplicated ([place]) 87/56/4332  . Mediastinal adenopathy 10/06/2015  . Reactive airway disease 10/05/2015  . History of CVA (cerebrovascular accident) 10/05/2015  . Contact dermatitis 10/05/2015  . Vision problem 05/20/2014  . Carpal tunnel syndrome 01/18/2014  . Weight gain 10/27/2013  . Asthma, chronic 12/11/2012  . Hyperlipidemia 08/25/2012  . Dysphagia 05/22/2012  . Hypertension 05/14/2012    Goals Addressed   None       Chronic Care Management   Outreach Note  10/26/2020 Name: SAKINA BRIONES MRN: [id number] DOB: [birth year]  Referred by: [name], NP Reason for referral : Chronic Care Management   Third unsuccessful telephone outreach was attempted today. The patient was referred to the pharmacist for assistance with care management and care coordination.     Follow-Up:  Pharmacist Review   Fanny Skates, [place] Pharmacist Assistant [phone]

## 2020-10-27 MED FILL — TRELEGY ELLIPTA 100-62.5-25: 100-62.5-25 | 30 days supply | Qty: 60 | Fill #3

## 2020-10-27 MED FILL — HYDROCHLOROTHIAZIDE 12.5 MG: 12.5 | 90 days supply | Qty: 90 | Fill #3

## 2020-11-09 ENCOUNTER — Other Ambulatory Visit: Payer: Self-pay | Admitting: Family

## 2020-11-09 ENCOUNTER — Other Ambulatory Visit (HOSPITAL_BASED_OUTPATIENT_CLINIC_OR_DEPARTMENT_OTHER): Payer: Self-pay | Admitting: Family

## 2020-11-09 MED FILL — ROSUVASTATIN CALCIUM 40 MG: 40 | 90 days supply | Qty: 90 | Fill #0

## 2020-11-09 MED FILL — LATANOPROST 0.005% OPTH SOL: 0.005 | 25 days supply | Qty: 3 | Fill #3

## 2020-11-16 ENCOUNTER — Encounter: Payer: Self-pay | Admitting: Family

## 2020-11-16 ENCOUNTER — Other Ambulatory Visit (HOSPITAL_BASED_OUTPATIENT_CLINIC_OR_DEPARTMENT_OTHER): Payer: Self-pay | Admitting: Internal Medicine

## 2020-11-16 MED FILL — FLUAD QUADRIVALENT 0.5 ML P: 0.5 | 1 days supply | Qty: 1 | Fill #0

## 2020-12-01 ENCOUNTER — Other Ambulatory Visit: Payer: Self-pay | Admitting: Internal Medicine

## 2020-12-01 ENCOUNTER — Other Ambulatory Visit (HOSPITAL_BASED_OUTPATIENT_CLINIC_OR_DEPARTMENT_OTHER): Payer: Self-pay | Admitting: Internal Medicine

## 2020-12-01 MED FILL — TRELEGY ELLIPTA 100-62.5-25: 100-62.5-25 | 30 days supply | Qty: 60 | Fill #0

## 2020-12-06 ENCOUNTER — Other Ambulatory Visit (HOSPITAL_BASED_OUTPATIENT_CLINIC_OR_DEPARTMENT_OTHER): Payer: Self-pay | Admitting: Optometry

## 2020-12-06 DIAGNOSIS — H40023 Open angle with borderline findings, high risk, bilateral: Secondary | ICD-10-CM | POA: Diagnosis not present

## 2020-12-06 MED FILL — LATANOPROST 0.005% EYE DRP: 0.005 | 25 days supply | Qty: 3 | Fill #0

## 2021-01-03 MED FILL — TRELEGY ELLIPTA 100-62.5-25: 100-62.5-25 | 30 days supply | Qty: 60 | Fill #1

## 2021-01-03 MED FILL — LATANOPROST 0.005% EYE DRP: 0.005 | 25 days supply | Qty: 3 | Fill #1

## 2021-01-24 ENCOUNTER — Other Ambulatory Visit (HOSPITAL_BASED_OUTPATIENT_CLINIC_OR_DEPARTMENT_OTHER): Payer: Self-pay | Admitting: Internal Medicine

## 2021-01-24 ENCOUNTER — Other Ambulatory Visit: Payer: Self-pay | Admitting: Internal Medicine

## 2021-02-03 MED FILL — TRELEGY ELLIPTA 100-62.5-25: 100-62.5-25 | 30 days supply | Qty: 60 | Fill #2

## 2021-02-06 ENCOUNTER — Other Ambulatory Visit: Payer: Self-pay | Admitting: Family

## 2021-02-07 ENCOUNTER — Other Ambulatory Visit (HOSPITAL_BASED_OUTPATIENT_CLINIC_OR_DEPARTMENT_OTHER): Payer: Self-pay | Admitting: Family

## 2021-02-07 ENCOUNTER — Ambulatory Visit (INDEPENDENT_AMBULATORY_CARE_PROVIDER_SITE_OTHER): Payer: Medicare Other | Admitting: Pharmacist

## 2021-02-07 DIAGNOSIS — Z8673 Personal history of transient ischemic attack (TIA), and cerebral infarction without residual deficits: Secondary | ICD-10-CM

## 2021-02-07 DIAGNOSIS — J45909 Unspecified asthma, uncomplicated: Secondary | ICD-10-CM

## 2021-02-07 DIAGNOSIS — E785 Hyperlipidemia, unspecified: Secondary | ICD-10-CM

## 2021-02-07 DIAGNOSIS — I10 Essential (primary) hypertension: Secondary | ICD-10-CM | POA: Diagnosis not present

## 2021-02-07 MED FILL — ROSUVASTATIN CALCIUM 40 MG: 40 | 30 days supply | Qty: 30 | Fill #0

## 2021-02-07 NOTE — Patient Instructions (Signed)
Visit Information  PATIENT GOALS: Goals Addressed            This Visit's Progress   . Chronic Care Management Pharmacy Care Plan   On track    CARE PLAN ENTRY (see longitudinal plan of care for additional care plan information)  Current Barriers:  . Chronic Disease Management support, education, and care coordination needs related to Reactive Airway Disease, HTN, HLD, History of CVA, DM   Hypertension BP Readings from Last 3 Encounters:  07/27/20 130/90  04/13/20 (!) 144/84  03/07/20 132/82   . Pharmacist Clinical Goal(s): o Over the next 180 days, patient will work with PharmD and providers to maintain BP goal <140/90 . Current regimen:  o Hydrochlorothiazide 12.5mg  daily . Patient self care activities - Over the next 180 days, patient will: o Maintain hypertension medication regimen.  o Make follow up appointment with cardiologist - Dr Harrington Challenger [phone]  Hyperlipidemia/Hx of CVA Lab Results  Component Value Date/Time   LDLCALC 72 01/22/2020 10:37 AM   . Pharmacist Clinical Goal(s): o Over the next 180 days, patient will work with PharmD and providers to achieve LDL goal < 70 . Current regimen:  . Rosuvastatin 40mg  daily . Aspirin 81mg  daily . Interventions: o Updated lipid panel at next visit o Discussed limiting fat in diet and increasing exercise . Patient self care activities - Over the next 180 days, patient will: o Maintain cholesterol medication regimen. o Complete updated lipid panel at next appointment with [name]  Diabetes Lab Results  Component Value Date/Time   HGBA1C 6.1 (H) 02/26/2020 03:54 PM   HGBA1C 6.2 10/01/2018 09:04 AM   . Pharmacist Clinical Goal(s): o Over the next 180 days, patient will work with PharmD and providers to maintain A1c goal <7% . Current regimen:  o Diet and exercise management   . Patient self care activities - Over the next 180 days, patient will: o Maintain a1c <7%  Reactive Airway Disease . Pharmacist  Clinical Goal(s) o Over the next 180 days, patient will work with PharmD and providers to reduce symptoms associated with reactive airway disease . Current regimen:  o Trelegy 1 puff daily . Patient self care activities - Over the next 180 days, patient will: o Maintain reactive airway disease medication regimen  Health Maintenance  . Pharmacist Clinical Goal(s) o Over the next 180 days, patient will work with PharmD and providers to complete health maintenance screenings/vaccinations . Interventions: o Discussed flu vaccine o Updated Shringrix vaccine records - Patient has completed series . Patient self care activities - Over the next 180 days, patient will: o Receive flu vaccine in Fall 2022 o Make appointment to see [name], NP for yearly follow up    Medication management . Pharmacist Clinical Goal(s): o Over the next 180 days, patient will work with PharmD and providers to maintain optimal medication adherence . Current pharmacy: [facility] . Interventions o Comprehensive medication review performed. o Updated medication list to include vitamins patient is currently taking o Continue current medication management strategy . Patient self care activities - Over the next 180 days, patient will: o Focus on medication adherence by filling and taking medications appropriately  o Take medications as prescribed o Report any questions or concerns to PharmD and/or provider(s)  Please see past updates related to this goal by clicking on the "Past Updates" button in the selected goal        The patient verbalized understanding of instructions, educational materials, and care plan  provided today and agreed to receive a mailed copy of patient instructions, educational materials, and care plan.   Telephone follow up appointment with care management team member scheduled for: 6 months  Cherre Robins, PharmD Clinical Pharmacist [facility] [phone]

## 2021-02-07 NOTE — Chronic Care Management (AMB) (Signed)
Chronic Care Management Pharmacy Note  02/07/2021 Name:  Suzanne Ali MRN:  [id number] DOB:  [birth year]  Subjective: Suzanne Ali is an 70 y.o. year old female who is a primary patient of Suzanne Alar, NP.  The CCM team was consulted for assistance with disease management and care coordination needs.    Engaged with patient by telephone for follow up visit in response to provider referral for pharmacy case management and/or care coordination services.   Consent to Services:  The patient was given information about Chronic Care Management services, agreed to services, and gave verbal consent prior to initiation of services.  Please see initial visit note for detailed documentation.   Patient Care Team: Suzanne Alar, NP as PCP - General (Internal Medicine) Fay Records, MD as PCP - Cardiology (Cardiology) Cherre Robins, PharmD (Pharmacist)  Recent office visits: 05/02/2020 - PCP Inda Castle) - seen for f/u of chronic conditions; No medications changes. Rx sent to [facility] to received Shingrix vaccine  Recent consult visits: 07/27/2020 pulmonary (Dr Chase Caller) - F/U followed for reactive airway disease/Asthma after exposure to bleach in 2016. Also had incidental lung nodule finding in 2017 on CT. Dr Chase Caller was  Following. Most recent CT of chest from January 2020 reported that the nodules have resolved. Patient's airway disease noted to be stable but Spiriva and Breo inhalers were changed to Trelegy daily for patient convenience. Continue Albuterol as needed. Due to high risk of sleep apnea - weight, HTN and snoring, referred to sleep specialist at pulmonary office.    Hospital visits: None in previous 6 months  Objective:  Lab Results  Component Value Date   CREATININE 1.20 (H) 02/19/2020   CREATININE 1.48 (H) 01/22/2020   CREATININE 0.98 11/16/2018    Lab Results  Component Value Date   HGBA1C 6.1 (H) 02/26/2020   Last diabetic Eye exam:  Lab  Results  Component Value Date/Time   HMDIABEYEEXA No Retinopathy 05/24/2020 12:00 AM    Last diabetic Foot exam: No results found for: HMDIABFOOTEX      Component Value Date/Time   CHOL 142 01/22/2020 1037   TRIG 68 01/22/2020 1037   HDL 56 01/22/2020 1037   CHOLHDL 2.5 01/22/2020 1037   CHOLHDL 3 10/01/2018 0904   VLDL 16.2 10/01/2018 0904   LDLCALC 72 01/22/2020 1037    Hepatic Function Latest Ref Rng & Units 02/26/2020 11/16/2018 10/01/2018  Total Protein 6.1 - 8.1 g/dL 7.2 7.7 7.7  Albumin 3.5 - 5.0 g/dL - 4.0 4.3  AST 10 - 35 U/L $Remo'21 22 19  'IeSdb$ ALT 6 - 29 U/L $Remo'14 15 16  'tknxR$ Alk Phosphatase 38 - 126 U/L - 54 58  Total Bilirubin 0.2 - 1.2 mg/dL 0.6 0.7 0.6  Bilirubin, Direct 0.0 - 0.2 mg/dL 0.2 - -    Lab Results  Component Value Date/Time   TSH 0.786 12/23/2012 11:28 AM    CBC Latest Ref Rng & Units 01/22/2020 11/16/2018 01/16/2016  WBC 3.4 - 10.8 x10E3/uL 9.6 8.9 10.6(H)  Hemoglobin 11.1 - 15.9 g/dL 13.8 13.8 13.3  Hematocrit 34.0 - 46.6 % 42.1 44.6 40.1  Platelets 150 - 450 x10E3/uL 211 207 247.0    No results found for: VD25OH  Clinical ASCVD: Yes  The 10-year ASCVD risk score Mikey Bussing DC Jr., et al., 2013) is: 20.4%   Values used to calculate the score:     Age: 61 years     Sex: Female     Is Non-Hispanic African American: Yes  Diabetic: Yes     Tobacco smoker: No     Systolic Blood Pressure: 462 mmHg     Is BP treated: Yes     HDL Cholesterol: 56 mg/dL     Total Cholesterol: 142 mg/dL    Other: (CHADS2VASc if Afib, PHQ9 if depression, MMRC or CAT for COPD, ACT, DEXA)  Social History   Tobacco Use  Smoking Status Never Smoker  Smokeless Tobacco Never Used   BP Readings from Last 3 Encounters:  07/27/20 130/90  04/13/20 (!) 144/84  03/07/20 132/82   Pulse Readings from Last 3 Encounters:  07/27/20 (!) 101  04/13/20 81  03/07/20 93   Wt Readings from Last 3 Encounters:  07/27/20 228 lb 3.2 oz (103.5 kg)  04/13/20 225 lb (102.1 kg)  03/07/20 221 lb 9.6  oz (100.5 kg)    Assessment: Review of patient past medical history, allergies, medications, health status, including review of consultants reports, laboratory and other test data, was performed as part of comprehensive evaluation and provision of chronic care management services.   SDOH:  (Social Determinants of Health) assessments and interventions performed:  SDOH Interventions   Flowsheet Row Most Recent Value  SDOH Interventions   Housing Interventions Intervention Not Indicated  Transportation Interventions Intervention Not Indicated      CCM Care Plan  Allergies  Allergen Reactions  . Lisinopril Swelling    angioedema  . Benicar [Olmesartan]     hair loss, dry skin.  . Latex     Swelling/peeling skin  . Penicillins Hives  . Sulfa Antibiotics Hives    Medications Reviewed Today    Reviewed by Cherre Robins, PharmD (Pharmacist) on 02/07/21 at [phone]  Med List Status: <None>  Medication Order Taking? Sig Documenting Provider Last Dose Status Informant  0.9 %  sodium chloride infusion [id number]   Ladene Artist, MD  Active   0.9 %  sodium chloride infusion [id number]   Ladene Artist, MD  Active   acetaminophen (TYLENOL) 325 MG tablet [id number] Yes Take 650 mg by mouth every 6 (six) hours as needed for mild pain, moderate pain or headache. [provider] Taking Active Self  albuterol (PROAIR HFA) 108 (90 Base) MCG/ACT inhaler [id number] No Inhale 2 puffs into the lungs every 6 (six) hours as needed for wheezing or shortness of breath.  Patient not taking: Reported on 02/07/2021   Suzanne Alar, NP Not Taking Active   aspirin EC 81 MG tablet [id number] Yes Take 1 tablet (81 mg total) by mouth daily. Suzanne Alar, NP Taking Active   ferrous sulfate 324 MG TBEC [id number] Yes Take 324 mg by mouth daily. [provider] Taking Active   hydrochlorothiazide (MICROZIDE) 12.5 MG capsule [id number] Yes Take 1 capsule (12.5 mg total) by mouth daily. Patient  needs to call the office and schedule an appointment for further refills 1st attempt Fay Records, MD Taking Active   latanoprost (XALATAN) 0.005 % ophthalmic solution [id number] Yes Place 1 drop into both eyes at bedtime. Started on 08/05/20 per patient [provider] Taking Active   rosuvastatin (CRESTOR) 40 MG tablet [id number] Yes TAKE 1 TABLET (40 MG TOTAL) BY MOUTH DAILY. Suzanne Alar, NP Taking Active   TRELEGY ELLIPTA 100-62.5-25 MCG/INH AEPB [id number] Yes INHALE 1 PUFF BY MOUTH INTO THE LUNGS DAILY. Brand Males, MD Taking Active   vitamin C (ASCORBIC ACID) 500 MG tablet [id number] Yes Take 500 mg by mouth daily. [provider] Taking Active   Vitamin D, Cholecalciferol, 25  MCG (1000 UT) CAPS [id number] Yes Take 1 capsule by mouth daily. [provider] Taking Active   vitamin E (VITAMIN E) 180 MG (400 UNITS) capsule [id number] Yes Take 400 Units by mouth daily. [provider] Taking Active           Patient Active Problem List   Diagnosis Date Noted  . Diabetes mellitus type 2, controlled, without complications ([place]) 96/29/5284  . Multiple lung nodules on CT 10/27/2015  . Syncope 10/18/2015  . Reactive airways dysfunction syndrome, moderate persistent, uncomplicated ([place]) 13/24/4010  . Mediastinal adenopathy 10/06/2015  . Reactive airway disease 10/05/2015  . History of CVA (cerebrovascular accident) 10/05/2015  . Contact dermatitis 10/05/2015  . Vision problem 05/20/2014  . Carpal tunnel syndrome 01/18/2014  . Weight gain 10/27/2013  . Asthma, chronic 12/11/2012  . Hyperlipidemia 08/25/2012  . Dysphagia 05/22/2012  . Hypertension 05/14/2012    Immunization History  Administered Date(s) Administered  . Fluad Quad(high Dose 65+) 10/06/2019, 10/16/2020, 11/16/2020  . Influenza, High Dose Seasonal PF 07/23/2016, 08/26/2017, 10/01/2018  . Influenza,inj,Quad PF,6+ Mos 10/23/2013, 09/20/2014, 07/25/2015  . Moderna Sars-Covid-2  Vaccination 12/16/2019, 01/18/2020  . Pneumococcal Conjugate-13 07/25/2015  . Pneumococcal Polysaccharide-23 01/23/2017  . Tdap 05/03/2012  . Zoster Recombinat (Shingrix) 04/13/2020    Conditions to be addressed/monitored: CAD, HTN, HLD, DMII and Pulmonary Disease  Care Plan : General Pharmacy (Adult)  Updates made by Cherre Robins, PHARMD since 02/07/2021 12:00 AM    Problem: Hypertension (Hypertension)     Long-Range Goal: Hypertension Monitored   This Visit's Progress: On track  Priority: Medium  Note:   Current Barriers:  . Needs follow up with PCP and Cardiologist  Pharmacist Clinical Goal(s):  Marland Kitchen Over the next 180 days, patient will  maintain BP of <130/80  through collaboration with PharmD and provider.   Interventions: . 1:1 collaboration with Suzanne Alar, NP regarding development and update of comprehensive plan of care as evidenced by provider attestation and co-signature . Inter-disciplinary care team collaboration (see longitudinal plan of care) . Comprehensive medication review performed; medication list updated in electronic medical record  Diabetes: . Controlled; current treatment: diet  . Current glucose readings: patent does not check BG at home . Current diet followed: no sodas and is restricting high carbohydrate foods and fried foods. Drinking more water. Her children are assisting with meals prep.  . Current exercise: none but she walks a lot with her 30 hour per week job at [employer] . Recommended Continue to limit CHO intake and try to increase physical activity to 150 minutes per week.   Hypertension: . Controlled; current treatment: HCTZ 12.5mg  daily . Current home readings: none . Denies hypotensive/hypertensive symptoms . Recommended continue current HTN regimen Collaborated with Cardiologist office to get appt for May 2022  Hyperlipidemia: . Not at goal of LDL <70 (last LDL was 72) current treatment: rosuvastatin 40mg  daily . Medications  previously tried: none  . Current dietary patterns: trying to avoid fried foods . Counseled on continuing to limit high fat foods / fried foods and recommended increase physical activity to 150 minutes per week as able.  Recommended continue rosuvastatin 40mg  daily   Asthma / reactive airway Disease . Controlled; current treatment: Trelegy - 1 inhalation into lungs daily . Recommended continue current regimen  Patient Goals/Self-Care Activities . Over the next 180 days, patient will:  take medications as prescribed and target a minimum of 150 minutes of moderate intensity exercise weekly  Follow Up Plan: Telephone follow up appointment with  care management team member scheduled for:  6 months  and Next PCP appointment scheduled for:  message has been sent to scheduler to make appt for June of 2022.     Problem: Medication Adherence (Wellness)     Goal: Medication Adherence Maintained   Note:   Evidence-based guidance:   Develop a complete and accurate medication list including those prescribed and over-the-counter, those taken only occasionally and those not taken by mouth such as injections, inhalers, ointments or creams and drops.   Review all medications to determine if patient or caregiver knows why the medications are given and if taken as prescribed.   Complete or review a medication adherence assessment including barriers to medication adherence.      Medication Assistance: None required.  Patient affirms current coverage meets needs. [place] LIS for 2022. Per patient medication copays are $0  Uses pill box? No  Pt endorses 100% compliance; Reviewed refill history and patient has fill all medications on time so far in 2022. She is due to have an appt with Dr Harrington Challenger (Cardio) for future refills of HCTZ. Assisted patient in this today.     Patient's preferred pharmacy is:  [facility], [address] [place] [address] Phone: [phone] Fax: [facility] [address] [place]. [place]. [place] [address] Phone: [phone] Fax: [phone]   Follow Up:  Patient agrees to Care Plan and Follow-up.  Plan: Telephone follow up appointment with care management team member scheduled for:  6 months  Cherre Robins, PharmD Clinical Pharmacist [place] [facility]

## 2021-02-07 NOTE — Progress Notes (Deleted)
Chronic Care Management Pharmacy Note  02/07/2021 Name:  Suzanne Ali MRN:  [id number] DOB:  1951-09-06  Subjective: Suzanne Ali is an 70 y.o. year old female who is a primary patient of [name], NP.  The CCM team was consulted for assistance with disease management and care coordination needs.    Engaged with patient by telephone for follow up visit in response to provider referral for pharmacy case management and/or care coordination services.   Consent to Services:  The patient was given information about Chronic Care Management services, agreed to services, and gave verbal consent prior to initiation of services.  Please see initial visit note for detailed documentation.   Patient Care Team: [name], NP as PCP - General (Internal Medicine) Fay Records, MD as PCP - Cardiology (Cardiology) Day, Melvenia Beam, [facility] (Inactive) as Pharmacist (Pharmacist)  Recent office visits: 05/02/2020 - PCP Inda Castle) - seen for f/u of chronic conditions; No medications changes. Rx sent to [facility] to received Shingrix vaccine  Recent consult visits: 07/27/2020 pulmonary (Dr Chase Caller) - F/U followed for reactive airway disease/Asthma after exposure to bleach in 2016. Also had incidental lung nodule finding in 2017 on CT. Dr Chase Caller was  Following. Most recent CT of chest from January 2020 reported that the nodules have resolved. Patient's airway disease noted to be stable but Spiriva and Breo inhalers were changed to Trelegy daily for patient convenience. Continue Albuterol as needed. Due to high risk of sleep apnea - weight, HTN and snoring, referred to sleep specialist at pulmonary office.    Hospital visits: None in previous 6 months  Objective:  Lab Results  Component Value Date   CREATININE 1.20 (H) 02/19/2020   CREATININE 1.48 (H) 01/22/2020   CREATININE 0.98 11/16/2018    Lab Results  Component Value Date   HGBA1C 6.1 (H) 02/26/2020   Last diabetic  Eye exam:  Lab Results  Component Value Date/Time   HMDIABEYEEXA No Retinopathy 05/24/2020 12:00 AM    Last diabetic Foot exam: No results found for: HMDIABFOOTEX      Component Value Date/Time   CHOL 142 01/22/2020 1037   TRIG 68 01/22/2020 1037   HDL 56 01/22/2020 1037   CHOLHDL 2.5 01/22/2020 1037   CHOLHDL 3 10/01/2018 0904   VLDL 16.2 10/01/2018 0904   LDLCALC 72 01/22/2020 1037    Hepatic Function Latest Ref Rng & Units 02/26/2020 11/16/2018 10/01/2018  Total Protein 6.1 - 8.1 g/dL 7.2 7.7 7.7  Albumin 3.5 - 5.0 g/dL - 4.0 4.3  AST 10 - 35 U/L _0 ALT 6 - 29 U/L _1 Alk Phosphatase 38 - 126 U/L - 54 58  Total Bilirubin 0.2 - 1.2 mg/dL 0.6 0.7 0.6  Bilirubin, Direct 0.0 - 0.2 mg/dL 0.2 - -    Lab Results  Component Value Date/Time   TSH 0.786 12/23/2012 11:28 AM    CBC Latest Ref Rng & Units 01/22/2020 11/16/2018 01/16/2016  WBC 3.4 - 10.8 x10E3/uL 9.6 8.9 10.6(H)  Hemoglobin 11.1 - 15.9 g/dL 13.8 13.8 13.3  Hematocrit 34.0 - 46.6 % 42.1 44.6 40.1  Platelets 150 - 450 x10E3/uL 211 207 247.0    No results found for: VD25OH  Clinical ASCVD: Yes  The 10-year ASCVD risk score Mikey Bussing DC Jr., et al., 2013) is: 20.4%   Values used to calculate the score:     Age: 68 years     Sex: Female     Is Non-Hispanic African  American: Yes     Diabetic: Yes     Tobacco smoker: No     Systolic Blood Pressure: 761 mmHg     Is BP treated: Yes     HDL Cholesterol: 56 mg/dL     Total Cholesterol: 142 mg/dL    Other: (CHADS2VASc if Afib, PHQ9 if depression, MMRC or CAT for COPD, ACT, DEXA)  Social History   Tobacco Use  Smoking Status Never Smoker  Smokeless Tobacco Never Used   BP Readings from Last 3 Encounters:  07/27/20 130/90  04/13/20 (!) 144/84  03/07/20 132/82   Pulse Readings from Last 3 Encounters:  07/27/20 (!) 101  04/13/20 81  03/07/20 93   Wt Readings from Last 3 Encounters:  07/27/20 228 lb 3.2 oz (103.5 kg)  04/13/20 225 lb (102.1 kg)   03/07/20 221 lb 9.6 oz (100.5 kg)    Assessment: Review of patient past medical history, allergies, medications, health status, including review of consultants reports, laboratory and other test data, was performed as part of comprehensive evaluation and provision of chronic care management services.   SDOH:  (Social Determinants of Health) assessments and interventions performed:    CCM Care Plan  Allergies  Allergen Reactions  . Lisinopril Swelling    angioedema  . Benicar [Olmesartan]     hair loss, dry skin.  . Latex     Swelling/peeling skin  . Penicillins Hives  . Sulfa Antibiotics Hives    Medications Reviewed Today    Reviewed by [place], [facility] (Pharmacist) on 08/10/20 at [place] List Status: <None>  Medication Order Taking? Sig Documenting Provider Last Dose Status Informant  0.9 %  sodium chloride infusion [id number]   Ladene Artist, MD  Active   0.9 %  sodium chloride infusion [id number]   Ladene Artist, MD  Active   acetaminophen (TYLENOL) 325 MG tablet [id number]  Take 650 mg by mouth every 6 (six) hours as needed for mild pain, moderate pain or headache. [provider]  Active Self  albuterol (PROAIR HFA) 108 (90 Base) MCG/ACT inhaler [id number]  Inhale 2 puffs into the lungs every 6 (six) hours as needed for wheezing or shortness of breath. [name], NP  Active   aspirin EC 81 MG tablet [id number]  Take 1 tablet (81 mg total) by mouth daily. [name], NP  Active   fluticasone furoate-vilanterol (BREO ELLIPTA) 100-25 MCG/INH AEPB [id number]  INHALE 1 PUFF BY MOUTH INTO THE LUNGS DAILY. [name], NP  Consider Medication Status and Discontinue (Change in therapy)            Med Note Vivien Presto Aug 10, 2020  9:06 AM) Switched to Trelegy at 9/15 visit with pulmonology  Fluticasone-Umeclidin-Vilant (TRELEGY ELLIPTA) 100-62.5-25 MCG/INH AEPB [id number]  Inhale 1 puff into the lungs daily. Brand Males, MD  Active    hydrochlorothiazide (MICROZIDE) 12.5 MG capsule [id number]  Take 1 capsule (12.5 mg total) by mouth daily. Fay Records, MD  Active   latanoprost (XALATAN) 0.005 % ophthalmic solution [id number] Yes Place 1 drop into both eyes at bedtime. Started on 08/05/20 per patient [provider]  Active   rosuvastatin (CRESTOR) 40 MG tablet [id number]  Take 1 tablet (40 mg total) by mouth daily. [name], NP  Active   Tiotropium Bromide Monohydrate (SPIRIVA RESPIMAT) 2.5 MCG/ACT AERS [id number]  INHALE 2 PUFFS INTO THE LUNGS DAILY. [name], NP  Active   Zoster Vaccine Adjuvanted [facility]) injection [id number]  Inject 0.53m IM now and again in 2-6 months. ODebbrah Alar NP  Active           Patient Active Problem List   Diagnosis Date Noted  . Diabetes mellitus type 2, controlled, without complications ([place] 019/91/4445 . Multiple lung nodules on CT 10/27/2015  . Syncope 10/18/2015  . Reactive airways dysfunction syndrome, moderate persistent, uncomplicated ([place] 184/83/5075 . Mediastinal adenopathy 10/06/2015  . Reactive airway disease 10/05/2015  . History of CVA (cerebrovascular accident) 10/05/2015  . Contact dermatitis 10/05/2015  . Vision problem 05/20/2014  . Carpal tunnel syndrome 01/18/2014  . Weight gain 10/27/2013  . Asthma, chronic 12/11/2012  . Hyperlipidemia 08/25/2012  . Dysphagia 05/22/2012  . Hypertension 05/14/2012    Immunization History  Administered Date(s) Administered  . Fluad Quad(high Dose 65+) 10/06/2019, 10/16/2020, 11/16/2020  . Influenza, High Dose Seasonal PF 07/23/2016, 08/26/2017, 10/01/2018  . Influenza,inj,Quad PF,6+ Mos 10/23/2013, 09/20/2014, 07/25/2015  . Moderna Sars-Covid-2 Vaccination 12/16/2019, 01/18/2020  . Pneumococcal Conjugate-13 07/25/2015  . Pneumococcal Polysaccharide-23 01/23/2017  . Tdap 05/03/2012  . Zoster Recombinat (Shingrix) 04/13/2020    Conditions to be addressed/monitored: {CCM ASSESSMENT  DISEASE OPTIONS:25047}  There are no care plans that you recently modified to display for this patient.   Medication Assistance: {MEDASSISTANCEINFO:25044}  [place]'s [place]:  [place] [address] [address]Phone: [phone]: [address] [place] [place]: [phone]: [phone] Uses pill box? {Yes or If no, why not?:20788} Pt endorses ***% compliance  Follow Up:  {FOLLOWUP:24991}  Plan: {CM FOLLOW UP PLAN:25073}  SIG***

## 2021-02-08 ENCOUNTER — Telehealth: Payer: Self-pay | Admitting: Family

## 2021-02-08 NOTE — Telephone Encounter (Signed)
Left voice mail to schedule appt

## 2021-02-08 NOTE — Telephone Encounter (Signed)
-----   Message from Cherre Robins, PharmD sent at 02/07/2021 10:53 AM EDT ----- [place] - Please contact patient to schedule appt with PCP - last appt was 04/13/2020

## 2021-02-28 ENCOUNTER — Other Ambulatory Visit (HOSPITAL_BASED_OUTPATIENT_CLINIC_OR_DEPARTMENT_OTHER): Payer: Self-pay

## 2021-02-28 ENCOUNTER — Other Ambulatory Visit: Payer: Self-pay | Admitting: Internal Medicine

## 2021-03-01 ENCOUNTER — Other Ambulatory Visit (HOSPITAL_BASED_OUTPATIENT_CLINIC_OR_DEPARTMENT_OTHER): Payer: Self-pay

## 2021-03-01 MED ORDER — HYDROCHLOROTHIAZIDE 12.5 MG PO CAPS
12.5000 mg | ORAL_CAPSULE | Freq: Every day | ORAL | 1 refills | Status: DC
  Filled 2021-03-01: qty 30, 30d supply, fill #0
  Filled 2021-04-03: qty 30, 30d supply, fill #1

## 2021-03-06 ENCOUNTER — Other Ambulatory Visit (HOSPITAL_BASED_OUTPATIENT_CLINIC_OR_DEPARTMENT_OTHER): Payer: Self-pay

## 2021-03-06 ENCOUNTER — Other Ambulatory Visit: Payer: Self-pay | Admitting: Family

## 2021-03-06 MED ORDER — ROSUVASTATIN CALCIUM 40 MG PO TABS
ORAL_TABLET | ORAL | 0 refills | Status: DC
  Filled 2021-03-06: qty 30, 30d supply, fill #0

## 2021-03-06 MED ORDER — ROSUVASTATIN CALCIUM 40 MG PO TABS
ORAL_TABLET | Freq: Every day | ORAL | 0 refills | Status: DC
  Filled 2021-03-06 (×2): qty 90, 90d supply, fill #0

## 2021-03-06 NOTE — Telephone Encounter (Signed)
Was filled

## 2021-03-07 ENCOUNTER — Other Ambulatory Visit (HOSPITAL_BASED_OUTPATIENT_CLINIC_OR_DEPARTMENT_OTHER): Payer: Self-pay

## 2021-03-08 ENCOUNTER — Ambulatory Visit: Payer: Medicare Other | Admitting: Family

## 2021-03-16 ENCOUNTER — Other Ambulatory Visit (HOSPITAL_BASED_OUTPATIENT_CLINIC_OR_DEPARTMENT_OTHER): Payer: Self-pay

## 2021-03-16 MED FILL — Fluticasone-Umeclidinium-Vilanterol AEPB 100-62.5-25 MCG/ACT: RESPIRATORY_TRACT | 30 days supply | Qty: 60 | Fill #0 | Status: AC

## 2021-03-23 NOTE — Progress Notes (Signed)
Cardiology Office Note:    Date:  03/24/2021   ID:  Kabrea, Seeney [birth year], MRN [id number]  PCP:  [name], NP   [facility] Providers Cardiologist:  Dorris Carnes, MD      Referring MD: [name], NP   Chief Complaint:  Follow-up (CAD)    Patient Profile:    Suzanne Ali is a 70 y.o. female with:   Coronary Ca2+ on chest CT  ETT in 2019: no ischemia; poor ex tol  Hx of syncope (including laugh syncope)  Diabetes mellitus   Hypertension   Hx of CVA   Hyperlipidemia   Aortic atherosclerosis    Prior CV studies: EXERCISE TOLERANCE TEST (ETT) 12/30/2017 Narrative  Blood pressure demonstrated a normal response to exercise.  There was no ST segment deviation noted during stress.  No T wave inversion was noted during stress.  Target heart rate nearly achieved.  No ischemic changes noted.  Echocardiogram 11/02/15 Moderate LVH, EF 65-70, no RWMA, GR 1 DD, mild LAE, trace MR, trace TR  Carotid US 05/05/12 [facility] in [place], [place]) No ICA stenosis  History of Present Illness: Ms. Angeletti was last seen by Dr. Harrington Challenger in 3/21.  She returns for f/u.  She is here alone. 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  She has not had chest pain, shortness of breath, syncope, orthopnea, leg edema.          Past Medical History:  Diagnosis Date  . Asthma   . Diabetes mellitus type 2, controlled, without complications ([place]) 4/62/7035  . Hyperlipidemia   . Hypertension   . Reactive airway disease   . Stroke [facility]) 2013    Current Medications: Current Meds  Medication Sig  . acetaminophen (TYLENOL) 325 MG tablet Take 650 mg by mouth every 6 (six) hours as needed for mild pain, moderate pain or headache.  . albuterol (PROAIR HFA) 108 (90 Base) MCG/ACT inhaler Inhale 2 puffs into the lungs every 6 (six) hours as needed for wheezing or shortness of breath.  Marland Kitchen aspirin EC 81 MG tablet Take 1 tablet (81 mg total) by mouth daily.  .  ferrous sulfate 324 MG TBEC Take 324 mg by mouth daily.  . hydrochlorothiazide (MICROZIDE) 12.5 MG capsule Take 1 capsule (12.5 mg total) by mouth daily. Please keep upcoming appt in May 2022 with Cardiologist before anymore refills. Thank you  . influenza vaccine adjuvanted (FLUAD) 0.5 ML injection INJECT AS DIRECTED  . latanoprost (XALATAN) 0.005 % ophthalmic solution Place 1 drop into both eyes at bedtime. Started on 08/05/20 per patient  . rosuvastatin (CRESTOR) 40 MG tablet TAKE 1 TABLET BY MOUTH ONCE DAILY  . TRELEGY ELLIPTA 100-62.5-25 MCG/INH AEPB INHALE 1 PUFF BY MOUTH INTO THE LUNGS DAILY.  . vitamin C (ASCORBIC ACID) 500 MG tablet Take 500 mg by mouth daily.  . Vitamin D, Cholecalciferol, 25 MCG (1000 UT) CAPS Take 1 capsule by mouth daily.  . vitamin E 180 MG (400 UNITS) capsule Take 400 Units by mouth daily.   Current Facility-Administered Medications for the 03/24/21 encounter (Office Visit) with Richardson Dopp T, PA-C  Medication  . 0.9 %  sodium chloride infusion  . 0.9 %  sodium chloride infusion     Allergies:   Lisinopril, Benicar [olmesartan], Latex, Penicillins, and Sulfa antibiotics   Social History   Tobacco Use  . Smoking status: Never Smoker  . Smokeless tobacco: Never Used  Vaping Use  . Vaping Use: Never used  Substance  Use Topics  . Alcohol use: No  . Drug use: No     Family Hx: The patient's family history includes CAD in her brother; Cancer in her father; Heart disease in her father and mother; Hypertension in her mother. There is no history of Colon cancer, Colon polyps, Diabetes, Stomach cancer, or Rectal cancer.  Review of Systems  Cardiovascular: Negative for claudication.     EKGs/Labs/Other Test Reviewed:    EKG:  EKG is   ordered today.  The ekg ordered today demonstrates normal sinus rhythm, HR 87, normal axis, no ST-TW changes, QTc 462 ms  Recent Labs: No results found for requested labs within last 8760 hours.   Recent Lipid Panel Lab  Results  Component Value Date/Time   CHOL 142 01/22/2020 10:37 AM   TRIG 68 01/22/2020 10:37 AM   HDL 56 01/22/2020 10:37 AM   CHOLHDL 2.5 01/22/2020 10:37 AM   CHOLHDL 3 10/01/2018 09:04 AM   LDLCALC 72 01/22/2020 10:37 AM      Risk Assessment/Calculations:      Physical Exam:    VS:  BP 110/70   Pulse 87   Ht 5\' 4"  (1.626 m)   Wt 203 lb 12.8 oz (92.4 kg)   SpO2 97%   BMI 34.98 kg/m     Wt Readings from Last 3 Encounters:  03/24/21 203 lb 12.8 oz (92.4 kg)  07/27/20 228 lb 3.2 oz (103.5 kg)  04/13/20 225 lb (102.1 kg)     Constitutional:      Appearance: Healthy appearance. Not in distress.  Neck:     Vascular: JVD normal.  Pulmonary:     Effort: Pulmonary effort is normal.     Breath sounds: No wheezing. No rales.  Cardiovascular:     Normal rate. Regular rhythm. Normal S1. Normal S2.     Murmurs: There is no murmur.  Edema:    Peripheral edema absent.  Abdominal:     Palpations: Abdomen is soft.  Skin:    General: Skin is warm and dry.  Neurological:     Mental Status: Alert and oriented to person, place and time.     Cranial Nerves: Cranial nerves are intact.         ASSESSMENT & PLAN:    1. Coronary artery calcification seen on CT scan Noted on prior CT.  She had a low risk ETT in 2019.  She is doing well without chest pain.  She remains on ASA, statin Rx.   2. Essential hypertension BP is controlled on current regimen.   3. Mixed hyperlipidemia She remains on high intensity statin.  With hx of CVA, goal LDL is < 70.  Lipids are managed by primary care.    4. Aortic atherosclerosis ([place]) She remains on ASA, statin Rx.     Dispo:  Return in about 1 year (around 03/24/2022) for Routine Follow Up, w/ Dr. Harrington Challenger.   Medication Adjustments/Labs and Tests Ordered: Current medicines are reviewed at length with the patient today.  Concerns regarding medicines are outlined above.  Tests Ordered: Orders Placed This Encounter  Procedures  . EKG  12-Lead   Medication Changes: No orders of the defined types were placed in this encounter.   Signed, Richardson Dopp, PA-C  03/24/2021 10:43 AM    [facility] [address], [place], [place]  [address] Phone: [phone]; Fax: [phone]

## 2021-03-24 ENCOUNTER — Encounter: Payer: Self-pay | Admitting: Physician Assistant

## 2021-03-24 ENCOUNTER — Other Ambulatory Visit: Payer: Self-pay

## 2021-03-24 ENCOUNTER — Ambulatory Visit (INDEPENDENT_AMBULATORY_CARE_PROVIDER_SITE_OTHER): Payer: Medicare Other | Admitting: Physician Assistant

## 2021-03-24 VITALS — BP 110/70 | HR 87 | Ht 64.0 in | Wt 203.8 lb

## 2021-03-24 DIAGNOSIS — E782 Mixed hyperlipidemia: Secondary | ICD-10-CM

## 2021-03-24 DIAGNOSIS — I700 Atherosclerosis of aorta: Secondary | ICD-10-CM

## 2021-03-24 DIAGNOSIS — I10 Essential (primary) hypertension: Secondary | ICD-10-CM | POA: Diagnosis not present

## 2021-03-24 DIAGNOSIS — I2510 Atherosclerotic heart disease of native coronary artery without angina pectoris: Secondary | ICD-10-CM

## 2021-03-24 NOTE — Patient Instructions (Signed)
Medication Instructions:  Your physician recommends that you continue on your current medications as directed. Please refer to the Current Medication list given to you today.  *If you need a refill on your cardiac medications before your next appointment, please call your pharmacy*   Lab Work: None ordered  If you have labs (blood work) drawn today and your tests are completely normal, you will receive your results only by: . MyChart Message (if you have MyChart) OR . A paper copy in the mail If you have any lab test that is abnormal or we need to change your treatment, we will call you to review the results.   Testing/Procedures: None ordered   Follow-Up: At [facility], you and your health needs are our priority.  As part of our continuing mission to provide you with exceptional heart care, we have created designated Provider Care Teams.  These Care Teams include your primary Cardiologist (physician) and Advanced Practice Providers (APPs -  Physician Assistants and Nurse Practitioners) who all work together to provide you with the care you need, when you need it.  We recommend signing up for the patient portal called "MyChart".  Sign up information is provided on this After Visit Summary.  MyChart is used to connect with patients for Virtual Visits (Telemedicine).  Patients are able to view lab/test results, encounter notes, upcoming appointments, etc.  Non-urgent messages can be sent to your provider as well.   To learn more about what you can do with MyChart, go to [email or web address]://[email or web address].com.    Your next appointment:   12 month(s)  The format for your next appointment:   In Person  Provider:   You may see Paula Ross, MD or one of the following Advanced Practice Providers on your designated Care Team:    Scott Weaver, PA-C  Vin Bhagat, PA-C    Other Instructions   

## 2021-04-03 ENCOUNTER — Other Ambulatory Visit (HOSPITAL_BASED_OUTPATIENT_CLINIC_OR_DEPARTMENT_OTHER): Payer: Self-pay

## 2021-04-04 ENCOUNTER — Encounter: Payer: Medicare Other | Admitting: Family

## 2021-04-06 ENCOUNTER — Other Ambulatory Visit (HOSPITAL_BASED_OUTPATIENT_CLINIC_OR_DEPARTMENT_OTHER): Payer: Self-pay

## 2021-04-11 DIAGNOSIS — H35372 Puckering of macula, left eye: Secondary | ICD-10-CM | POA: Diagnosis not present

## 2021-04-11 DIAGNOSIS — E119 Type 2 diabetes mellitus without complications: Secondary | ICD-10-CM | POA: Diagnosis not present

## 2021-04-11 DIAGNOSIS — H2513 Age-related nuclear cataract, bilateral: Secondary | ICD-10-CM | POA: Diagnosis not present

## 2021-04-11 DIAGNOSIS — H40023 Open angle with borderline findings, high risk, bilateral: Secondary | ICD-10-CM | POA: Diagnosis not present

## 2021-04-11 DIAGNOSIS — H02055 Trichiasis without entropian left lower eyelid: Secondary | ICD-10-CM | POA: Diagnosis not present

## 2021-04-11 LAB — HM DIABETES EYE EXAM

## 2021-04-12 ENCOUNTER — Encounter: Payer: Medicare Other | Admitting: Family
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
## 2021-04-20 ENCOUNTER — Other Ambulatory Visit (HOSPITAL_BASED_OUTPATIENT_CLINIC_OR_DEPARTMENT_OTHER): Payer: Self-pay

## 2021-04-20 ENCOUNTER — Other Ambulatory Visit: Payer: Self-pay

## 2021-04-20 ENCOUNTER — Other Ambulatory Visit: Payer: Self-pay | Admitting: Internal Medicine

## 2021-04-20 MED ORDER — LATANOPROST 0.005 % OP SOLN
OPHTHALMIC | 3 refills | Status: DC
  Filled 2021-04-20: qty 2.5, 28d supply, fill #0
  Filled 2021-05-29 – 2021-06-06 (×2): qty 2.5, 28d supply, fill #1
  Filled 2021-10-18: qty 2.5, 28d supply, fill #2
  Filled 2021-12-21: qty 2.5, 28d supply, fill #3

## 2021-04-21 ENCOUNTER — Other Ambulatory Visit (HOSPITAL_BASED_OUTPATIENT_CLINIC_OR_DEPARTMENT_OTHER): Payer: Self-pay

## 2021-04-24 ENCOUNTER — Other Ambulatory Visit: Payer: Self-pay | Admitting: Internal Medicine

## 2021-04-24 ENCOUNTER — Other Ambulatory Visit (HOSPITAL_BASED_OUTPATIENT_CLINIC_OR_DEPARTMENT_OTHER): Payer: Self-pay

## 2021-04-24 MED FILL — Fluticasone-Umeclidinium-Vilanterol AEPB 100-62.5-25 MCG/ACT: RESPIRATORY_TRACT | 30 days supply | Qty: 60 | Fill #0 | Status: AC

## 2021-05-01 ENCOUNTER — Emergency Department (HOSPITAL_BASED_OUTPATIENT_CLINIC_OR_DEPARTMENT_OTHER)
Admission: EM | Admit: 2021-05-01 | Discharge: 2021-05-01 | Disposition: A | Discharge status: Home / Self Care | Payer: Medicare Other | Attending: Emergency Medicine | Admitting: Emergency Medicine

## 2021-05-01 ENCOUNTER — Other Ambulatory Visit: Payer: Self-pay

## 2021-05-01 ENCOUNTER — Encounter (HOSPITAL_BASED_OUTPATIENT_CLINIC_OR_DEPARTMENT_OTHER): Payer: Self-pay | Admitting: *Deleted

## 2021-05-01 ENCOUNTER — Other Ambulatory Visit (HOSPITAL_BASED_OUTPATIENT_CLINIC_OR_DEPARTMENT_OTHER): Payer: Self-pay

## 2021-05-01 DIAGNOSIS — Z7952 Long term (current) use of systemic steroids: Secondary | ICD-10-CM | POA: Insufficient documentation

## 2021-05-01 DIAGNOSIS — Z7982 Long term (current) use of aspirin: Secondary | ICD-10-CM | POA: Diagnosis not present

## 2021-05-01 DIAGNOSIS — Z79899 Other long term (current) drug therapy: Secondary | ICD-10-CM | POA: Diagnosis not present

## 2021-05-01 DIAGNOSIS — H6691 Otitis media, unspecified, right ear: Secondary | ICD-10-CM | POA: Diagnosis not present

## 2021-05-01 DIAGNOSIS — J019 Acute sinusitis, unspecified: Secondary | ICD-10-CM | POA: Diagnosis not present

## 2021-05-01 DIAGNOSIS — Z91040 Latex allergy status: Secondary | ICD-10-CM | POA: Diagnosis not present

## 2021-05-01 DIAGNOSIS — I10 Essential (primary) hypertension: Secondary | ICD-10-CM | POA: Insufficient documentation

## 2021-05-01 DIAGNOSIS — E119 Type 2 diabetes mellitus without complications: Secondary | ICD-10-CM | POA: Diagnosis not present

## 2021-05-01 DIAGNOSIS — J45909 Unspecified asthma, uncomplicated: Secondary | ICD-10-CM | POA: Diagnosis not present

## 2021-05-01 DIAGNOSIS — H6121 Impacted cerumen, right ear: Secondary | ICD-10-CM | POA: Insufficient documentation

## 2021-05-01 DIAGNOSIS — H9201 Otalgia, right ear: Secondary | ICD-10-CM | POA: Diagnosis present

## 2021-05-01 MED ORDER — DOCUSATE SODIUM 50 MG/5ML PO LIQD
50.0000 mg | Freq: Once | ORAL | Status: AC
  Administered 2021-05-01: 50 mg via ORAL
  Filled 2021-05-01: qty 10

## 2021-05-01 MED ORDER — CEFDINIR 300 MG PO CAPS
300.0000 mg | ORAL_CAPSULE | Freq: Two times a day (BID) | ORAL | 0 refills | Status: DC
  Filled 2021-05-01: qty 20, 10d supply, fill #0
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
## 2021-05-01 NOTE — ED Triage Notes (Signed)
C/o let ear pain x 3 days

## 2021-05-01 NOTE — ED Notes (Signed)
Signature pad not available at time of discharge. Pt has verbalized understanding to discharge paperwork. Given instructions on prescription and follow up information. No further questions. NAD, ambulatory to check out desk

## 2021-05-03 ENCOUNTER — Other Ambulatory Visit (HOSPITAL_BASED_OUTPATIENT_CLINIC_OR_DEPARTMENT_OTHER): Payer: Self-pay

## 2021-05-05 ENCOUNTER — Other Ambulatory Visit (HOSPITAL_BASED_OUTPATIENT_CLINIC_OR_DEPARTMENT_OTHER): Payer: Self-pay

## 2021-05-05 ENCOUNTER — Other Ambulatory Visit: Payer: Self-pay | Admitting: Family

## 2021-05-06 MED ORDER — HYDROCHLOROTHIAZIDE 12.5 MG PO CAPS
12.5000 mg | ORAL_CAPSULE | Freq: Every day | ORAL | 5 refills | Status: DC
  Filled 2021-05-06: qty 30, 30d supply, fill #0
  Filled 2021-06-13: qty 30, 30d supply, fill #1
  Filled 2021-07-10: qty 30, 30d supply, fill #2
  Filled 2021-08-10: qty 30, 30d supply, fill #3
  Filled 2021-09-08: qty 30, 30d supply, fill #4
  Filled 2021-10-18: qty 30, 30d supply, fill #5

## 2021-05-08 ENCOUNTER — Other Ambulatory Visit (HOSPITAL_BASED_OUTPATIENT_CLINIC_OR_DEPARTMENT_OTHER): Payer: Self-pay

## 2021-05-12 ENCOUNTER — Other Ambulatory Visit (HOSPITAL_BASED_OUTPATIENT_CLINIC_OR_DEPARTMENT_OTHER): Payer: Self-pay

## 2021-05-12 ENCOUNTER — Other Ambulatory Visit: Payer: Self-pay

## 2021-05-12 ENCOUNTER — Ambulatory Visit (INDEPENDENT_AMBULATORY_CARE_PROVIDER_SITE_OTHER): Payer: Medicare Other | Admitting: Family

## 2021-05-12 VITALS — BP 131/74 | HR 77 | Temp 98.2°F | Resp 16 | Ht 64.0 in | Wt 205.0 lb

## 2021-05-12 DIAGNOSIS — I10 Essential (primary) hypertension: Secondary | ICD-10-CM

## 2021-05-12 DIAGNOSIS — Z8673 Personal history of transient ischemic attack (TIA), and cerebral infarction without residual deficits: Secondary | ICD-10-CM

## 2021-05-12 DIAGNOSIS — J45909 Unspecified asthma, uncomplicated: Secondary | ICD-10-CM

## 2021-05-12 DIAGNOSIS — E119 Type 2 diabetes mellitus without complications: Secondary | ICD-10-CM | POA: Diagnosis not present

## 2021-05-12 DIAGNOSIS — E782 Mixed hyperlipidemia: Secondary | ICD-10-CM

## 2021-05-12 LAB — LIPID PANEL
Cholesterol: 156 mg/dL (ref 0–200)
HDL: 61.4 mg/dL (ref >39.00)
LDL Cholesterol: 82 mg/dL (ref 0–99)
NonHDL: 94.53
Total CHOL/HDL Ratio: 3
Triglycerides: 65 mg/dL (ref 0.0–149.0)
VLDL: 13 mg/dL (ref 0.0–40.0)

## 2021-05-12 LAB — COMPREHENSIVE METABOLIC PANEL
ALT: 16 U/L (ref 0–35)
AST: 21 U/L (ref 0–37)
Albumin: 4.2 g/dL (ref 3.5–5.2)
Alkaline Phosphatase: 64 U/L (ref 39–117)
BUN: 24 mg/dL — ABNORMAL HIGH (ref 6–23)
CO2: 25 mEq/L (ref 19–32)
Calcium: 9.5 mg/dL (ref 8.4–10.5)
Chloride: 106 mEq/L (ref 96–112)
Creatinine, Ser: 1.32 mg/dL — ABNORMAL HIGH (ref 0.40–1.20)
GFR: 40.72 mL/min — ABNORMAL LOW (ref >60.00)
Glucose, Bld: 93 mg/dL (ref 70–99)
Potassium: 4.2 mEq/L (ref 3.5–5.1)
Sodium: 139 mEq/L (ref 135–145)
Total Bilirubin: 0.6 mg/dL (ref 0.2–1.2)
Total Protein: 7 g/dL (ref 6.0–8.3)

## 2021-05-12 LAB — HEMOGLOBIN A1C: Hgb A1c MFr Bld: 6.4 % (ref 4.6–6.5)

## 2021-05-12 MED ORDER — ALBUTEROL SULFATE HFA 108 (90 BASE) MCG/ACT IN AERS
2.0000 | INHALATION_SPRAY | Freq: Four times a day (QID) | RESPIRATORY_TRACT | 5 refills | Status: DC | PRN
  Filled 2021-05-12: qty 8.5, 25d supply, fill #0

## 2021-05-12 MED ORDER — ROSUVASTATIN CALCIUM 40 MG PO TABS
40.0000 mg | ORAL_TABLET | Freq: Every day | ORAL | 1 refills | Status: DC
  Filled 2021-05-12 – 2021-06-13 (×2): qty 90, 90d supply, fill #0
  Filled 2021-09-05: qty 90, 90d supply, fill #1

## 2021-05-12 MED ORDER — ASPIRIN 81 MG PO TBEC: 81.0000 mg | DELAYED_RELEASE_TABLET | Freq: Every day | ORAL | 12 refills | Status: AC

## 2021-05-12 NOTE — Assessment & Plan Note (Signed)
Tolerating rosuvastatin 40mg .  Obtain follow up lipid panel, cmet.

## 2021-05-12 NOTE — Assessment & Plan Note (Signed)
She is not taking aspirin. Recommended aspirin 81mg  once daily for secondary stroke prevention along with lipid management and BP control.
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
## 2021-05-12 NOTE — Assessment & Plan Note (Signed)
Clinically stable. Check A1C.

## 2021-05-12 NOTE — Assessment & Plan Note (Signed)
Uncontrolled, despite Trelegy. She does not have a rescue inhaler. Will send rx for albuterol MDI.

## 2021-05-12 NOTE — Assessment & Plan Note (Signed)
Stable on hctz 25mg  once daily. Continue same.

## 2021-05-12 NOTE — Patient Instructions (Signed)
Please complete lab work prior to leaving.   

## 2021-05-15 ENCOUNTER — Encounter: Payer: Self-pay | Admitting: Family

## 2021-05-16 NOTE — Progress Notes (Signed)
Mailed out to pt 

## 2021-05-29 ENCOUNTER — Other Ambulatory Visit (HOSPITAL_BASED_OUTPATIENT_CLINIC_OR_DEPARTMENT_OTHER): Payer: Self-pay

## 2021-05-29 MED FILL — Fluticasone-Umeclidinium-Vilanterol AEPB 100-62.5-25 MCG/ACT: RESPIRATORY_TRACT | 30 days supply | Qty: 60 | Fill #1 | Status: CN

## 2021-05-30 ENCOUNTER — Telehealth: Payer: Self-pay | Admitting: Pharmacist

## 2021-05-30 NOTE — Chronic Care Management (AMB) (Signed)
    Chronic Care Management Pharmacy Assistant   Name: Suzanne Ali  MRN: [id number] DOB: [birth year]   Reason for Encounter: Disease State General    Recent office visits:  05/12/21-Melissa Inda Castle, NP (PCP) Seen for Annual Exam. Labs ordered. Recommended aspirin 81mg  once daily for secondary stroke prevention along with lipid management and BP control. Start on Albuterol (VENTOLIN HFA) 108 (90 Base) MCG/ACT inhaler Inhale 2 puffs into the lungs every 6 (six) hours as needed for wheezing or shortness of breath. Follow up in 4 weeks.  Recent consult visits:  03/24/21-[place] J[facility]ne Minors (Cardiology) General follow up. EKG completed.Follow up in 1 year.  Hospital visits:  Medication Reconciliation was completed by comparing discharge summary, patient's EMR and Pharmacy list, and upon discussion with patient.  Admitted to the hospital on 05/01/21 due to Ear pain. Discharge date was 05/01/21. Discharged from [facility]?Medications Started at [facility] Discharge:?? -started Cefdinir 300 mg cap due to Ear pain  Medication Changes at Hospital Discharge: -Changed Noted  Medications Discontinued at Hospital Discharge: -Stopped None noted  Medications that remain the same after Hospital Discharge:??  -All other medications will remain the same.    Medications: Outpatient Encounter Medications as of 05/30/2021  Medication Sig   acetaminophen (TYLENOL) 325 MG tablet Take 650 mg by mouth every 6 (six) hours as needed for mild pain, moderate pain or headache.   albuterol (VENTOLIN HFA) 108 (90 Base) MCG/ACT inhaler Inhale 2 puffs into the lungs every 6 (six) hours as needed for wheezing or shortness of breath.   aspirin 81 MG EC tablet Take 1 tablet (81 mg total) by mouth daily. Swallow whole.   ferrous sulfate 324 MG TBEC Take 324 mg by mouth daily.   Fluticasone-Umeclidin-Vilant (TRELEGY ELLIPTA) 100-62.5-25 MCG/INH AEPB INHALE 1 PUFF BY MOUTH INTO THE LUNGS  DAILY.   hydrochlorothiazide (MICROZIDE) 12.5 MG capsule Take 1 capsule (12.5 mg total) by mouth daily. Please keep upcoming appt in May 2022 with Cardiologist before anymore refills. Thank you   latanoprost (XALATAN) 0.005 % ophthalmic solution Instill 1 drop in both eyes at bedtime   rosuvastatin (CRESTOR) 40 MG tablet Take 1 tablet (40 mg total) by mouth daily.   vitamin C (ASCORBIC ACID) 500 MG tablet Take 500 mg by mouth daily.   Vitamin D, Cholecalciferol, 25 MCG (1000 UT) CAPS Take 1 capsule by mouth daily.   vitamin E 180 MG (400 UNITS) capsule Take 400 Units by mouth daily.   Facility-Administered Encounter Medications as of 05/30/2021  Medication   0.9 %  sodium chloride infusion   0.9 %  sodium chloride infusion   Attempted to reach patient 3 times for General assessment call. I left voicemail's for the patient to return phone call when possible.  Star Rating Drugs: Rosuvastatin 40 mg Last filled:03/07/21 90DS  Corrie Mckusick, [place]

## 2021-06-05 ENCOUNTER — Other Ambulatory Visit (HOSPITAL_BASED_OUTPATIENT_CLINIC_OR_DEPARTMENT_OTHER): Payer: Self-pay

## 2021-06-06 ENCOUNTER — Other Ambulatory Visit (HOSPITAL_BASED_OUTPATIENT_CLINIC_OR_DEPARTMENT_OTHER): Payer: Self-pay

## 2021-06-06 MED FILL — Fluticasone-Umeclidinium-Vilanterol AEPB 100-62.5-25 MCG/ACT: RESPIRATORY_TRACT | 30 days supply | Qty: 60 | Fill #1 | Status: AC

## 2021-06-13 ENCOUNTER — Other Ambulatory Visit (HOSPITAL_BASED_OUTPATIENT_CLINIC_OR_DEPARTMENT_OTHER): Payer: Self-pay

## 2021-06-21 ENCOUNTER — Other Ambulatory Visit: Payer: Self-pay

## 2021-06-21 ENCOUNTER — Ambulatory Visit (INDEPENDENT_AMBULATORY_CARE_PROVIDER_SITE_OTHER): Payer: Medicare Other | Admitting: Family

## 2021-06-21 ENCOUNTER — Encounter: Payer: Self-pay | Admitting: Family

## 2021-06-21 VITALS — BP 131/68 | HR 79 | Temp 98.0°F | Resp 16 | Ht 64.0 in | Wt 203.0 lb

## 2021-06-21 DIAGNOSIS — Z0000 Encounter for general adult medical examination without abnormal findings: Secondary | ICD-10-CM

## 2021-06-21 DIAGNOSIS — E119 Type 2 diabetes mellitus without complications: Secondary | ICD-10-CM
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
## 2021-06-21 NOTE — Patient Instructions (Signed)
Please schedule a routine dental exam.  

## 2021-06-21 NOTE — Assessment & Plan Note (Addendum)
Colo up to date.  Discussed healthy diet and regular exercise. Immunizations reviewed and up to date.  Mammogram is due and oder as been placed. Recommended that she schedule follow up dental exam.

## 2021-07-10 ENCOUNTER — Other Ambulatory Visit (HOSPITAL_BASED_OUTPATIENT_CLINIC_OR_DEPARTMENT_OTHER): Payer: Self-pay

## 2021-07-10 MED FILL — Fluticasone-Umeclidinium-Vilanterol AEPB 100-62.5-25 MCG/ACT: RESPIRATORY_TRACT | 30 days supply | Qty: 60 | Fill #2 | Status: AC

## 2021-07-11 ENCOUNTER — Other Ambulatory Visit (HOSPITAL_BASED_OUTPATIENT_CLINIC_OR_DEPARTMENT_OTHER): Payer: Self-pay

## 2021-08-09 ENCOUNTER — Telehealth: Payer: Medicare Other

## 2021-08-10 ENCOUNTER — Other Ambulatory Visit: Payer: Self-pay | Admitting: Internal Medicine

## 2021-08-10 ENCOUNTER — Other Ambulatory Visit (HOSPITAL_BASED_OUTPATIENT_CLINIC_OR_DEPARTMENT_OTHER): Payer: Self-pay

## 2021-08-10 MED ORDER — TRELEGY ELLIPTA 100-62.5-25 MCG/INH IN AEPB
INHALATION_SPRAY | RESPIRATORY_TRACT | 1 refills | Status: DC
  Filled 2021-08-10: qty 60, 30d supply, fill #0

## 2021-08-11 ENCOUNTER — Other Ambulatory Visit (HOSPITAL_BASED_OUTPATIENT_CLINIC_OR_DEPARTMENT_OTHER): Payer: Self-pay

## 2021-08-11 ENCOUNTER — Telehealth: Payer: Medicare Other

## 2021-08-14 ENCOUNTER — Other Ambulatory Visit (HOSPITAL_BASED_OUTPATIENT_CLINIC_OR_DEPARTMENT_OTHER): Payer: Self-pay

## 2021-08-17 ENCOUNTER — Telehealth: Payer: Medicare Other

## 2021-08-17 ENCOUNTER — Telehealth: Payer: Self-pay | Admitting: Pharmacist

## 2021-08-17 NOTE — Telephone Encounter (Signed)
Attempted to contact patient for second time (first was 08/09/2021 at 10;45am) for Chronic Care Management follow up phone visit. Unable to reach patient. LM on VM with my contact number [phone]

## 2021-09-04 ENCOUNTER — Telehealth: Payer: Self-pay | Admitting: Family

## 2021-09-04 NOTE — Telephone Encounter (Signed)
Left message for patient to call back and schedule Medicare Annual Wellness Visit (AWV) in office.   If not able to come in office, please offer to do virtually or by telephone.  Left office number and my jabber [phone].  Last AWV:10/06/2019  Please schedule at anytime with Nurse Health Advisor.

## 2021-09-05 ENCOUNTER — Other Ambulatory Visit (HOSPITAL_BASED_OUTPATIENT_CLINIC_OR_DEPARTMENT_OTHER): Payer: Self-pay

## 2021-09-08 ENCOUNTER — Other Ambulatory Visit (HOSPITAL_BASED_OUTPATIENT_CLINIC_OR_DEPARTMENT_OTHER): Payer: Self-pay

## 2021-09-19 ENCOUNTER — Other Ambulatory Visit (HOSPITAL_BASED_OUTPATIENT_CLINIC_OR_DEPARTMENT_OTHER): Payer: Self-pay

## 2021-09-19 DIAGNOSIS — H401234 Low-tension glaucoma, bilateral, indeterminate stage: Secondary | ICD-10-CM | POA: Diagnosis not present

## 2021-09-19 DIAGNOSIS — H35372 Puckering of macula, left eye: Secondary | ICD-10-CM | POA: Diagnosis not present

## 2021-09-19 DIAGNOSIS — E119 Type 2 diabetes mellitus without complications: Secondary | ICD-10-CM | POA: Diagnosis not present

## 2021-09-19 DIAGNOSIS — H2513 Age-related nuclear cataract, bilateral: Secondary | ICD-10-CM | POA: Diagnosis not present

## 2021-09-19 MED ORDER — SIMBRINZA 1-0.2 % OP SUSP
1.0000 [drp] | Freq: Two times a day (BID) | OPHTHALMIC | 0 refills | Status: DC
  Filled 2021-09-19: qty 8, 80d supply, fill #0

## 2021-09-20 ENCOUNTER — Other Ambulatory Visit (HOSPITAL_BASED_OUTPATIENT_CLINIC_OR_DEPARTMENT_OTHER): Payer: Self-pay

## 2021-09-20 ENCOUNTER — Other Ambulatory Visit: Payer: Self-pay | Admitting: Internal Medicine

## 2021-09-21 ENCOUNTER — Other Ambulatory Visit (HOSPITAL_BASED_OUTPATIENT_CLINIC_OR_DEPARTMENT_OTHER): Payer: Self-pay

## 2021-09-22 ENCOUNTER — Ambulatory Visit: Payer: Medicare Other | Admitting: Family

## 2021-09-25 ENCOUNTER — Other Ambulatory Visit (HOSPITAL_BASED_OUTPATIENT_CLINIC_OR_DEPARTMENT_OTHER): Payer: Self-pay

## 2021-09-25 MED ORDER — TRELEGY ELLIPTA 100-62.5-25 MCG/ACT IN AEPB
INHALATION_SPRAY | RESPIRATORY_TRACT | 1 refills | Status: DC
  Filled 2021-09-25: qty 60, 30d supply, fill #0

## 2021-10-04 ENCOUNTER — Telehealth: Payer: Self-pay | Admitting: Pharmacist

## 2021-10-04 ENCOUNTER — Telehealth: Payer: Medicare Other

## 2021-10-04 NOTE — Telephone Encounter (Signed)
Third attempt to reach patient for Chronic Care Management follow up appointment. Unable to reach patient. LM on VM with my contact number [phone] Will have scheduling team reach out one more time to rescheduled appointment.

## 2021-10-09 NOTE — Chronic Care Management (AMB) (Signed)
  Care Management   Note  10/09/2021 Name: HONI NAME MRN: [id number] DOB: 29-Jan-1951  Jaclin T Kobel is a 70 y.o. year old female who is a primary care patient of [name], NP and is actively engaged with the care management team. I reached out to [place] by phone today to assist with re-scheduling a follow up visit with the Pharmacist  Follow up plan: Telephone appointment with care management team member scheduled for: 10/26/2021  Julian Hy, [place], [place] Management  Direct Dial: [phone]

## 2021-10-18 ENCOUNTER — Other Ambulatory Visit (HOSPITAL_BASED_OUTPATIENT_CLINIC_OR_DEPARTMENT_OTHER): Payer: Self-pay

## 2021-10-20 DIAGNOSIS — E119 Type 2 diabetes mellitus without complications: Secondary | ICD-10-CM | POA: Diagnosis not present

## 2021-10-20 DIAGNOSIS — H35372 Puckering of macula, left eye: Secondary | ICD-10-CM | POA: Diagnosis not present

## 2021-10-20 DIAGNOSIS — H401234 Low-tension glaucoma, bilateral, indeterminate stage: Secondary | ICD-10-CM | POA: Diagnosis not present

## 2021-10-23 ENCOUNTER — Other Ambulatory Visit (HOSPITAL_BASED_OUTPATIENT_CLINIC_OR_DEPARTMENT_OTHER): Payer: Self-pay

## 2021-10-26 ENCOUNTER — Telehealth: Payer: Medicare Other

## 2021-10-26 ENCOUNTER — Telehealth: Payer: Self-pay | Admitting: Pharmacist

## 2021-10-26 NOTE — Telephone Encounter (Signed)
°  Care Management   Follow Up Note   10/26/2021 Name: Suzanne Ali MRN: [id number] DOB: 03-09-1951   Referred by: [name], NP Reason for referral : No chief complaint on file.   An unsuccessful telephone outreach was attempted today. The patient was referred to the case management team for assistance with care management and care coordination.  Left message on patient's voicemail my contact number [phone] and office number [phone].   Follow Up Plan: The care management team will reach out to the patient again over the next 30 days.   Cherre Robins, PharmD Clinical Pharmacist [place] [facility]

## 2021-10-26 NOTE — Telephone Encounter (Signed)
Attempted to contact patient for Chronic Care Management follow up phone visit. Unable to reach patient. LM on VM with my contact number [phone]

## 2021-10-27 ENCOUNTER — Telehealth: Payer: Self-pay | Admitting: Family

## 2021-10-27 NOTE — Telephone Encounter (Signed)
Left message for patient to call back and schedule Medicare Annual Wellness Visit (AWV) in office.   If not able to come in office, please offer to do virtually or by telephone.  Left office number and my jabber [phone].  Last AWV:10/06/2019  Please schedule at anytime with Nurse Health Advisor.

## 2021-10-30 ENCOUNTER — Ambulatory Visit (INDEPENDENT_AMBULATORY_CARE_PROVIDER_SITE_OTHER): Payer: Medicare Other | Admitting: Pharmacist

## 2021-10-30 ENCOUNTER — Other Ambulatory Visit (HOSPITAL_BASED_OUTPATIENT_CLINIC_OR_DEPARTMENT_OTHER): Payer: Self-pay

## 2021-10-30 DIAGNOSIS — Z8673 Personal history of transient ischemic attack (TIA), and cerebral infarction without residual deficits: Secondary | ICD-10-CM

## 2021-10-30 DIAGNOSIS — E782 Mixed hyperlipidemia: Secondary | ICD-10-CM

## 2021-10-30 DIAGNOSIS — J45909 Unspecified asthma, uncomplicated: Secondary | ICD-10-CM

## 2021-10-30 DIAGNOSIS — I10 Essential (primary) hypertension: Secondary | ICD-10-CM

## 2021-10-30 DIAGNOSIS — E119 Type 2 diabetes mellitus without complications: Secondary | ICD-10-CM

## 2021-10-30 MED ORDER — HYDROCHLOROTHIAZIDE 12.5 MG PO CAPS
12.5000 mg | ORAL_CAPSULE | Freq: Every day | ORAL | 0 refills | Status: DC
  Filled 2021-10-30 – 2021-11-23 (×2): qty 90, 90d supply, fill #0

## 2021-10-30 MED ORDER — TRELEGY ELLIPTA 100-62.5-25 MCG/ACT IN AEPB
INHALATION_SPRAY | RESPIRATORY_TRACT | 1 refills | Status: DC
  Filled 2021-10-30 – 2021-11-17 (×2): qty 60, 30d supply, fill #0

## 2021-10-30 NOTE — Chronic Care Management (AMB) (Signed)
Chronic Care Management Pharmacy Note  10/30/2021 Name:  Suzanne Ali MRN:  [id number] DOB:  [birth year]  Subjective: Suzanne Ali is an 70 y.o. year old female who is a primary patient of [name], NP.  The CCM team was consulted for assistance with disease management and care coordination needs.    Engaged with patient by telephone for follow up visit in response to provider referral for pharmacy case management and/or care coordination services.   Consent to Services:  The patient was given information about Chronic Care Management services, agreed to services, and gave verbal consent prior to initiation of services.  Please see initial visit note for detailed documentation.   Patient Care Team: [name], NP as PCP - General (Internal Medicine) Fay Records, MD as PCP - Cardiology (Cardiology) Cherre Robins, RPH-CPP (Pharmacist)  Recent office visits: 06/21/21 - PCP Inda Castle, NP) CPE. Ordered mammogram.  05/12/21-PCP Inda Castle, NP) Annual Exam. Labs ordered. Recommended aspirin 81mg  once daily for secondary stroke prevention. Start Albuterol (VENTOLIN HFA)  inhaler Inhale 2 puffs into the lungs every 6 (six) hours as needed for wheezing or shortness of breath. Follow up in 4 weeks. 05/02/2020 - PCP Inda Castle) - seen for f/u of chronic conditions; No medications changes. Rx sent to [facility] to received Shingrix vaccine   Recent consult visits: 03/24/21-Cardio - Kathlen Mody, PA-C) General cardio follow up. EKG completed.Follow up in 1 year.   Hospital visits: 05/01/2021 ED Visit for ear pain. Started cefdinir 300mg  bid.   Objective:  Lab Results  Component Value Date   CREATININE 1.32 (H) 05/12/2021   CREATININE 1.20 (H) 02/19/2020   CREATININE 1.48 (H) 01/22/2020    Lab Results  Component Value Date   HGBA1C 6.4 05/12/2021   Last diabetic Eye exam:  Lab Results  Component Value Date/Time   HMDIABEYEEXA No Retinopathy 04/11/2021 12:00  AM    Last diabetic Foot exam: No results found for: HMDIABFOOTEX      Component Value Date/Time   CHOL 156 05/12/2021 0944   CHOL 142 01/22/2020 1037   TRIG 65.0 05/12/2021 0944   HDL 61.40 05/12/2021 0944   HDL 56 01/22/2020 1037   CHOLHDL 3 05/12/2021 0944   VLDL 13.0 05/12/2021 0944   LDLCALC 82 05/12/2021 0944   LDLCALC 72 01/22/2020 1037    Hepatic Function Latest Ref Rng & Units 05/12/2021 02/26/2020 11/16/2018  Total Protein 6.0 - 8.3 g/dL 7.0 7.2 7.7  Albumin 3.5 - 5.2 g/dL 4.2 - 4.0  AST 0 - 37 U/L 21 21 22   ALT 0 - 35 U/L 16 14 15   Alk Phosphatase 39 - 117 U/L 64 - 54  Total Bilirubin 0.2 - 1.2 mg/dL 0.6 0.6 0.7  Bilirubin, Direct 0.0 - 0.2 mg/dL - 0.2 -    Lab Results  Component Value Date/Time   TSH 0.786 12/23/2012 11:28 AM    CBC Latest Ref Rng & Units 01/22/2020 11/16/2018 01/16/2016  WBC 3.4 - 10.8 x10E3/uL 9.6 8.9 10.6(H)  Hemoglobin 11.1 - 15.9 g/dL 13.8 13.8 13.3  Hematocrit 34.0 - 46.6 % 42.1 44.6 40.1  Platelets 150 - 450 x10E3/uL 211 207 247.0    No results found for: VD25OH  Clinical ASCVD: Yes  The 10-year ASCVD risk score (Arnett DK, et al., 2019) is: 23.8%   Values used to calculate the score:     Age: 61 years     Sex: Female     Is Non-Hispanic African American: Yes     Diabetic:  Yes     Tobacco smoker: No     Systolic Blood Pressure: 454 mmHg     Is BP treated: Yes     HDL Cholesterol: 61.4 mg/dL     Total Cholesterol: 156 mg/dL      Social History   Tobacco Use  Smoking Status Never  Smokeless Tobacco Never   BP Readings from Last 3 Encounters:  06/21/21 131/68  05/12/21 131/74  05/01/21 (!) 125/110   Pulse Readings from Last 3 Encounters:  06/21/21 79  05/12/21 77  05/01/21 74   Wt Readings from Last 3 Encounters:  06/21/21 203 lb (92.1 kg)  05/12/21 205 lb (93 kg)  05/01/21 200 lb (90.7 kg)    Assessment: Review of patient past medical history, allergies, medications, health status, including review of consultants  reports, laboratory and other test data, was performed as part of comprehensive evaluation and provision of chronic care management services.   SDOH:  (Social Determinants of Health) assessments and interventions performed:  SDOH Interventions    Flowsheet Row Most Recent Value  SDOH Interventions   Financial Strain Interventions Intervention Not Indicated       CCM Care Plan  Allergies  Allergen Reactions   Lisinopril Swelling    angioedema   Benicar [Olmesartan]     hair loss, dry skin.   Latex     Swelling/peeling skin   Shellfish Allergy    Penicillins Hives   Sulfa Antibiotics Hives    Medications Reviewed Today     Reviewed by Cherre Robins, RPH-CPP (Pharmacist) on 10/30/21 at 0931  Med List Status: <None>   Medication Order Taking? Sig Documenting Provider Last Dose Status Informant  0.9 %  sodium chloride infusion [id number]   Ladene Artist, MD  Active   0.9 %  sodium chloride infusion [id number]   Ladene Artist, MD  Active   acetaminophen (TYLENOL) 325 MG tablet [id number] Yes Take 650 mg by mouth every 6 (six) hours as needed for mild pain, moderate pain or headache. [provider] Taking Active Self  albuterol (VENTOLIN HFA) 108 (90 Base) MCG/ACT inhaler [id number] Yes Inhale 2 puffs into the lungs every 6 (six) hours as needed for wheezing or shortness of breath. [name], NP Taking Active   aspirin 81 MG EC tablet [id number] Yes Take 1 tablet (81 mg total) by mouth daily. Swallow whole. [name], NP Taking Active   Brinzolamide-Brimonidine [facility]) 1-0.2 % SUSP [id number] Yes Place 1 drop into both eyes 2 (two) times daily.  Taking Active   ferrous sulfate 324 MG TBEC [id number] Yes Take 324 mg by mouth daily. [provider] Taking Active   Fluticasone-Umeclidin-Vilant (TRELEGY ELLIPTA) 100-62.5-25 MCG/INH AEPB [id number] Yes INHALE 1 PUFF BY MOUTH INTO THE LUNGS DAILY. Brand Males, MD Taking Active    hydrochlorothiazide (MICROZIDE) 12.5 MG capsule [id number] Yes Take 1 capsule (12.5 mg total) by mouth daily. Colon Branch, MD Taking Active   latanoprost (XALATAN) 0.005 % ophthalmic solution [id number] Yes Instill 1 drop in both eyes at bedtime  Taking Active   rosuvastatin (CRESTOR) 40 MG tablet [id number] Yes Take 1 tablet (40 mg total) by mouth daily. [name], NP Taking Active   vitamin C (ASCORBIC ACID) 500 MG tablet [id number] Yes Take 500 mg by mouth daily. [provider] Taking Active   Vitamin D, Cholecalciferol, 25 MCG (1000 UT) CAPS [id number] Yes Take 1 capsule by mouth daily. [provider] Taking Active   vitamin E 180 MG (400  UNITS) capsule [id number] Yes Take 400 Units by mouth daily. [provider] Taking Active             Patient Active Problem List   Diagnosis Date Noted   Preventative health care 06/21/2021   Diabetes mellitus type 2, controlled, without complications ([place]) 37/02/8888   Multiple lung nodules on CT 10/27/2015   Syncope 10/18/2015   Reactive airways dysfunction syndrome, moderate persistent, uncomplicated ([place]) 16/94/5038   Mediastinal adenopathy 10/06/2015   Reactive airway disease 10/05/2015   History of CVA (cerebrovascular accident) 10/05/2015   Contact dermatitis 10/05/2015   Vision problem 05/20/2014   Carpal tunnel syndrome 01/18/2014   Weight gain 10/27/2013   Asthma, chronic 12/11/2012   Hyperlipidemia 08/25/2012   Dysphagia 05/22/2012   Hypertension 05/14/2012    Immunization History  Administered Date(s) Administered   Fluad Quad(high Dose 65+) 10/06/2019, 10/16/2020, 11/16/2020   Influenza, High Dose Seasonal PF 07/23/2016, 08/26/2017, 10/01/2018   Influenza,inj,Quad PF,6+ Mos 10/23/2013, 09/20/2014, 07/25/2015   Moderna SARS-COV2 Booster Vaccination 09/28/2020, 05/25/2021   Moderna Sars-Covid-2 Vaccination 12/16/2019, 01/18/2020   Pneumococcal Conjugate-13 07/25/2015   Pneumococcal  Polysaccharide-23 01/23/2017   Tdap 05/03/2012   Zoster Recombinat (Shingrix) 04/13/2020, 10/14/2020    Conditions to be addressed/monitored: CAD, HTN, HLD, DMII and Pulmonary Disease  Care Plan : General Pharmacy (Adult)  Updates made by Cherre Robins, PHARMD since 02/07/2021 12:00 AM     Problem: Hypertension (Hypertension)      Long-Range Goal: Hypertension Monitored   This Visit's Progress: On track  Priority: Medium  Note:   Current Barriers:  Needs follow up with PCP and Cardiologist  Pharmacist Clinical Goal(s):  Over the next 180 days, patient will  maintain BP of <130/80  through collaboration with PharmD and provider.   Interventions: 1:1 collaboration with [name], NP regarding development and update of comprehensive plan of care as evidenced by provider attestation and co-signature Inter-disciplinary care team collaboration (see longitudinal plan of care) Comprehensive medication review performed; medication list updated in electronic medical record  Diabetes:  Controlled; current treatment: diet   Current glucose readings: patent does not check BG at home  Current diet followed: no sodas and is restricting high carbohydrate foods and fried foods. Drinking more water. Her children are assisting with meals prep.   Current exercise: none but she walks a lot with her 30 hour per week job at [employer] to limit CHO intake and try to increase physical activity to 150 minutes per week.   Hypertension:  Controlled; current treatment: HCTZ 12.5mg  daily  Current home readings: none  Denies hypotensive/hypertensive symptoms  Recommended continue current HTN regimen Collaborated with Cardiologist office to get appt for May 2022  Hyperlipidemia:  Not at goal of LDL <70 (last LDL was 72) current treatment: rosuvastatin 40mg  daily  Medications previously tried: none   Current dietary patterns: trying to avoid fried foods  Counseled on  continuing to limit high fat foods / fried foods and recommended increase physical activity to 150 minutes per week as able.  Recommended continue rosuvastatin 40mg  daily   Asthma / reactive airway Disease  Controlled; current treatment: Trelegy - 1 inhalation into lungs daily  Recommended continue current regimen  Patient Goals/Self-Care Activities Over the next 180 days, patient will:  take medications as prescribed and target a minimum of 150 minutes of moderate intensity exercise weekly  Follow Up Plan: Telephone follow up appointment with care management team member scheduled for:  6 months  and Next PCP  appointment scheduled for:  message has been sent to scheduler to make appt for June of 2022.      Problem: Medication Adherence (Wellness)      Goal: Medication Adherence Maintained   Note:   Evidence-based guidance:  Develop a complete and accurate medication list including those prescribed and over-the-counter, those taken only occasionally and those not taken by mouth such as injections, inhalers, ointments or creams and drops.  Review all medications to determine if patient or caregiver knows why the medications are given and if taken as prescribed.  Complete or review a medication adherence assessment including barriers to medication adherence.      Medication Assistance: None required.  Patient affirms current coverage meets needs. [place] LIS for 2022. Per patient medication copays are $0    Uses pill box? No  Pt endorses 100% compliance; Reviewed refill history and patient has fill all medications on time in 2022.    Patient's preferred pharmacy is:  [facility] [address] [place] [address] Phone: [phone] Fax: [place] [address], [place]. [place]. [place] [address] Phone: [phone] Fax: [phone]   Follow Up:  Patient agrees  to Care Plan and Follow-up.  Plan: Telephone follow up appointment with care management team member scheduled for:  4 months  Cherre Robins, PharmD Clinical Pharmacist [facility] [facility]

## 2021-10-30 NOTE — Patient Instructions (Addendum)
Ms. Wragg,  It was a pleasure speaking with you today.  I have attached a summary of our visit today and information about your health goals.  Below is contact information for [facility] which is listed on the [facility] website as providers that accept your Medicare Eye Plan.   [facility] [place], [place] [address] Clinic [phone] Lennox Grumbles Doctors: Warden Fillers, Richard Malibu or [employer].    Our next appointment is by telephone on February 28, 2022 at 9:15am Please call the care guide team at [phone] if you need to cancel or reschedule your appointment.   If you have any questions or concerns, please feel free to contact me either at the phone number below or with a MyChart message.   Keep up the good work!  Cherre Robins, PharmD Clinical Pharmacist [place] [phone] (direct line)  [phone] (main office number)       CARE PLAN ENTRY  Current Barriers:  Chronic Disease Management support, education, and care coordination needs related to Reactive Airway Disease, HTN, HLD, History of CVA, DM   Hypertension BP Readings from Last 3 Encounters:  06/21/21 131/68  05/12/21 131/74  05/01/21 (!) 125/110   Pharmacist Clinical Goal(s): Over the next 180 days, patient will work with PharmD and providers to maintain BP goal <140/90 Current regimen:  Hydrochlorothiazide 12.5mg  daily Patient self care activities - Over the next 180 days, patient will: Maintain hypertension medication regimen.  Collaborated with pharmacy to updated hydrochlorothiazide prescription for 90 days supply  Hyperlipidemia/Hx of CVA Lab Results  Component Value Date/Time   LDLCALC 82 05/12/2021 09:44 AM   LDLCALC 72 01/22/2020 10:37 AM   Pharmacist Clinical Goal(s): Over the next 180 days, patient will work with PharmD and providers to achieve LDL goal < 70 Current regimen:  Rosuvastatin 40mg   daily Aspirin 81mg  daily Interventions: Discussed limiting fat in diet and increasing exercise Patient self care activities - Over the next 180 days, patient will: Maintain cholesterol medication regimen. Limit intake of saturated fat / follow heart health diet.   Diabetes Lab Results  Component Value Date/Time   HGBA1C 6.4 05/12/2021 09:44 AM   HGBA1C 6.1 (H) 02/26/2020 03:54 PM   Pharmacist Clinical Goal(s): Over the next 180 days, patient will work with PharmD and providers to maintain A1c goal <7% Current regimen:  Diet and exercise management   Patient self care activities - Over the next 180 days, patient will: Maintain a1c <7%  Reactive Airway Disease Pharmacist Clinical Goal(s) Over the next 180 days, patient will work with PharmD and providers to reduce symptoms associated with reactive airway disease Current regimen:  Trelegy 1 puff daily Patient self care activities - Over the next 180 days, patient will: Maintain reactive airway disease medication regimen I have requested refill for Trelegy - make sure to pick up at [facility].   Health Maintenance  Pharmacist Clinical Goal(s) Over the next 180 days, patient will work with PharmD and providers to complete health maintenance screenings/vaccinations Interventions: Discussed flu vaccine Checked with [facility] and they were unable to confirm that patient received flu vaccine for the 2022 - 23 season Patient self care activities - Over the next 180 days, patient will: Receive flu vaccine as soon as possible Recommended patient get flu vaccine Researched and provided names and number for ophthalmologist that  [place] Medicare eye plan Made appointment for follow up with PCP -  appointment December 01, 2021 at 9:20am   Medication management Pharmacist Clinical Goal(s): Over the next 180 days, patient will work with PharmD and providers to maintain optimal medication adherence Current  pharmacy: [facility] Interventions Comprehensive medication review performed. Updated medication list to include vitamins patient is currently taking Continue current medication management strategy Patient self care activities - Over the next 180 days, patient will: Focus on medication adherence by filling and taking medications appropriately  Take medications as prescribed Report any questions or concerns to PharmD and/or provider(s)  Heart-Healthy Eating Plan Heart-healthy meal planning includes: Eating less unhealthy fats. Eating more healthy fats. Making other changes in your diet. Talk with your doctor or a diet specialist (dietitian) to create an eating plan that is right for you. What is my plan? Your doctor may recommend an eating plan that includes: Total fat: 30% or less of total calories a day. Saturated fat: 10% or less of total calories a day. Cholesterol: less than 200mg  a day. What are tips for following this plan? Cooking Avoid frying your food. Try to bake, boil, grill, or broil it instead. You can also reduce fat by: Removing the skin from poultry. Removing all visible fats from meats. Steaming vegetables in water or broth. Meal planning  At meals, divide your plate into four equal parts: Fill one-half of your plate with vegetables and green salads. Fill one-fourth of your plate with whole grains. Fill one-fourth of your plate with lean protein foods. Eat 4-5 servings of vegetables per day. A serving of vegetables is: 1 cup of raw or cooked vegetables. 2 cups of raw leafy greens. Eat 4-5 servings of fruit per day. A serving of fruit is: 1 medium whole fruit.  cup of dried fruit.  cup of fresh, frozen, or canned fruit.  cup of 100% fruit juice. Eat more foods that have soluble fiber. These are apples, broccoli, carrots, beans, peas, and barley. Try to get 20-30 g of fiber per day. Eat 4-5 servings of nuts, legumes, and seeds per week: 1 serving  of dried beans or legumes equals  cup after being cooked. 1 serving of nuts is  cup. 1 serving of seeds equals 1 tablespoon. General information Eat more home-cooked food. Eat less restaurant, buffet, and fast food. Limit or avoid alcohol. Limit foods that are high in starch and sugar. Avoid fried foods. Lose weight if you are overweight. Keep track of how much salt (sodium) you eat. This is important if you have high blood pressure. Ask your doctor to tell you more about this. Try to add vegetarian meals each week. Fats Choose healthy fats. These include olive oil and canola oil, flaxseeds, walnuts, almonds, and seeds. Eat more omega-3 fats. These include salmon, mackerel, sardines, tuna, flaxseed oil, and ground flaxseeds. Try to eat fish at least 2 times each week. Check food labels. Avoid foods with trans fats or high amounts of saturated fat. Limit saturated fats. These are often found in animal products, such as meats, butter, and cream. These are also found in plant foods, such as palm oil, palm kernel oil, and coconut oil. Avoid foods with partially hydrogenated oils in them. These have trans fats. Examples are stick margarine, some tub margarines, cookies, crackers, and other baked goods. What foods can I eat? Fruits All fresh, canned (in natural juice), or frozen fruits. Vegetables Fresh or frozen vegetables (raw, steamed, roasted, or grilled). Green salads. Grains Most grains. Choose whole wheat and whole grains most of the time.  Rice and pasta, including brown rice and pastas made with whole wheat. Meats and other proteins Lean, well-trimmed beef, veal, pork, and lamb. Chicken and [place] without skin. All fish and shellfish. Wild duck, rabbit, pheasant, and venison. Egg whites or low-cholesterol egg substitutes. Dried beans, peas, lentils, and tofu. Seeds and most nuts. Dairy Low-fat or nonfat cheeses, including ricotta and mozzarella. Skim or 1% milk that is liquid,  powdered, or evaporated. Buttermilk that is made with low-fat milk. Nonfat or low-fat yogurt. Fats and oils Non-hydrogenated (trans-free) margarines. Vegetable oils, including soybean, sesame, sunflower, olive, peanut, safflower, corn, canola, and cottonseed. Salad dressings or mayonnaise made with a vegetable oil. Beverages Mineral water. Coffee and tea. Diet carbonated beverages. Sweets and desserts Sherbet, gelatin, and fruit ice. Small amounts of dark chocolate. Limit all sweets and desserts. Seasonings and condiments All seasonings and condiments. The items listed above may not be a complete list of foods and drinks you can eat. Contact a dietitian for more options. What foods should I avoid? Fruits Canned fruit in heavy syrup. Fruit in cream or butter sauce. Fried fruit. Limit coconut. Vegetables Vegetables cooked in cheese, cream, or butter sauce. Fried vegetables. Grains Breads that are made with saturated or trans fats, oils, or whole milk. Croissants. Sweet rolls. Donuts. High-fat crackers, such as cheese crackers. Meats and other proteins Fatty meats, such as hot dogs, ribs, sausage, bacon, rib-eye roast or steak. High-fat deli meats, such as salami and bologna. Caviar. Domestic duck and goose. Organ meats, such as liver. Dairy Cream, sour cream, cream cheese, and creamed cottage cheese. Whole-milk cheeses. Whole or 2% milk that is liquid, evaporated, or condensed. Whole buttermilk. Cream sauce or high-fat cheese sauce. Yogurt that is made from whole milk. Fats and oils Meat fat, or shortening. Cocoa butter, hydrogenated oils, palm oil, coconut oil, palm kernel oil. Solid fats and shortenings, including bacon fat, salt pork, lard, and butter. Nondairy cream substitutes. Salad dressings with cheese or sour cream. Beverages Regular sodas and juice drinks with added sugar. Sweets and desserts Frosting. Pudding. Cookies. Cakes. Pies. Milk chocolate or white chocolate. Buttered  syrups. Full-fat ice cream or ice cream drinks. The items listed above may not be a complete list of foods and drinks to avoid. Contact a dietitian for more information. Summary Heart-healthy meal planning includes eating less unhealthy fats, eating more healthy fats, and making other changes in your diet. Eat a balanced diet. This includes fruits and vegetables, low-fat or nonfat dairy, lean protein, nuts and legumes, whole grains, and heart-healthy oils and fats. This information is not intended to replace advice given to you by your health care provider. Make sure you discuss any questions you have with your health care provider. Document Revised: 03/09/2021 Document Reviewed: 03/09/2021 Elsevier Patient Education  2022 [employer].

## 2021-11-07 ENCOUNTER — Other Ambulatory Visit (HOSPITAL_BASED_OUTPATIENT_CLINIC_OR_DEPARTMENT_OTHER): Payer: Self-pay

## 2021-11-11 DIAGNOSIS — E119 Type 2 diabetes mellitus without complications: Secondary | ICD-10-CM | POA: Diagnosis not present

## 2021-11-11 DIAGNOSIS — I10 Essential (primary) hypertension: Secondary | ICD-10-CM | POA: Diagnosis not present

## 2021-11-11 DIAGNOSIS — J45909 Unspecified asthma, uncomplicated: Secondary | ICD-10-CM

## 2021-11-11 DIAGNOSIS — E782 Mixed hyperlipidemia: Secondary | ICD-10-CM | POA: Diagnosis not present

## 2021-11-17 ENCOUNTER — Other Ambulatory Visit (HOSPITAL_BASED_OUTPATIENT_CLINIC_OR_DEPARTMENT_OTHER): Payer: Self-pay

## 2021-11-23 ENCOUNTER — Other Ambulatory Visit: Payer: Self-pay | Admitting: Internal Medicine

## 2021-11-23 ENCOUNTER — Other Ambulatory Visit (HOSPITAL_BASED_OUTPATIENT_CLINIC_OR_DEPARTMENT_OTHER): Payer: Self-pay

## 2021-11-27 ENCOUNTER — Other Ambulatory Visit (HOSPITAL_BASED_OUTPATIENT_CLINIC_OR_DEPARTMENT_OTHER): Payer: Self-pay

## 2021-12-01 ENCOUNTER — Ambulatory Visit (INDEPENDENT_AMBULATORY_CARE_PROVIDER_SITE_OTHER): Payer: Medicare Other | Admitting: Family

## 2021-12-01 ENCOUNTER — Telehealth: Payer: Self-pay

## 2021-12-01 VITALS — BP 136/85 | HR 78 | Temp 98.0°F | Resp 16 | Wt 207.0 lb

## 2021-12-01 DIAGNOSIS — I10 Essential (primary) hypertension: Secondary | ICD-10-CM | POA: Diagnosis not present

## 2021-12-01 DIAGNOSIS — E782 Mixed hyperlipidemia: Secondary | ICD-10-CM

## 2021-12-01 DIAGNOSIS — H409 Unspecified glaucoma: Secondary | ICD-10-CM | POA: Insufficient documentation

## 2021-12-01 DIAGNOSIS — E119 Type 2 diabetes mellitus without complications: Secondary | ICD-10-CM | POA: Diagnosis not present

## 2021-12-01 NOTE — Telephone Encounter (Signed)
[name] with [facility] called stating they received a referral for pt and they do not accept the pt's insurance.

## 2021-12-01 NOTE — Patient Instructions (Signed)
Please complete lab work prior to leaving.   

## 2021-12-01 NOTE — Assessment & Plan Note (Signed)
BP at goal. Continue hctz 12.5mg  once daily.

## 2021-12-01 NOTE — Assessment & Plan Note (Signed)
LDL at goal. Continue crestor 40mg .

## 2021-12-01 NOTE — Assessment & Plan Note (Signed)
Clinically stable. Continue DM diet.

## 2021-12-01 NOTE — Assessment & Plan Note (Signed)
Requesting new eye doctor- hers left.  Will refer.
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
## 2021-12-04 NOTE — Telephone Encounter (Signed)
Lvm for patient to call back about this message 

## 2021-12-05 NOTE — Telephone Encounter (Signed)
noted 

## 2021-12-05 NOTE — Telephone Encounter (Signed)
Patient called back and was informed of message, insurance number was given to her per scanned card on our system. Pt stated she understood and will call them to see who is in networks and call us back.

## 2021-12-21 ENCOUNTER — Other Ambulatory Visit (HOSPITAL_BASED_OUTPATIENT_CLINIC_OR_DEPARTMENT_OTHER): Payer: Self-pay

## 2021-12-21 ENCOUNTER — Other Ambulatory Visit: Payer: Self-pay | Admitting: Family

## 2021-12-21 MED ORDER — ROSUVASTATIN CALCIUM 40 MG PO TABS
40.0000 mg | ORAL_TABLET | Freq: Every day | ORAL | 1 refills | Status: DC
  Filled 2021-12-21: qty 90, 90d supply, fill #0
  Filled 2022-04-20: qty 90, 90d supply, fill #1

## 2022-01-11 ENCOUNTER — Telehealth: Payer: Self-pay | Admitting: Family

## 2022-01-11 NOTE — Telephone Encounter (Signed)
Left message for patient to call back and schedule Medicare Annual Wellness Visit (AWV) in office.  ? ?If not able to come in office, please offer to do virtually or by telephone.  Left office number and my jabber [phone]. ? ?Last AWV:10/06/2019 ? ?Please schedule at anytime with Nurse Health Advisor. ?  ?

## 2022-01-15 ENCOUNTER — Other Ambulatory Visit (HOSPITAL_BASED_OUTPATIENT_CLINIC_OR_DEPARTMENT_OTHER): Payer: Self-pay

## 2022-01-15 ENCOUNTER — Other Ambulatory Visit: Payer: Self-pay | Admitting: Family

## 2022-01-15 MED ORDER — TRELEGY ELLIPTA 100-62.5-25 MCG/ACT IN AEPB
INHALATION_SPRAY | RESPIRATORY_TRACT | 1 refills | Status: DC
  Filled 2022-01-15: qty 60, 30d supply, fill #0
  Filled 2022-02-28: qty 60, 30d supply, fill #1

## 2022-01-16 ENCOUNTER — Other Ambulatory Visit (HOSPITAL_BASED_OUTPATIENT_CLINIC_OR_DEPARTMENT_OTHER): Payer: Self-pay

## 2022-02-07 ENCOUNTER — Telehealth: Payer: Self-pay | Admitting: Family

## 2022-02-07 NOTE — Telephone Encounter (Signed)
Left message for patient to call back and schedule Medicare Annual Wellness Visit (AWV) in office.  ? ?If not able to come in office, please offer to do virtually or by telephone.  Left office number and my jabber [phone]. ? ?Last AWV:10/06/2019 ? ?Please schedule at anytime with Nurse Health Advisor. ?  ?

## 2022-02-28 ENCOUNTER — Ambulatory Visit (INDEPENDENT_AMBULATORY_CARE_PROVIDER_SITE_OTHER): Payer: Medicare Other | Admitting: Pharmacist

## 2022-02-28 ENCOUNTER — Other Ambulatory Visit (HOSPITAL_BASED_OUTPATIENT_CLINIC_OR_DEPARTMENT_OTHER): Payer: Self-pay

## 2022-02-28 DIAGNOSIS — I10 Essential (primary) hypertension: Secondary | ICD-10-CM

## 2022-02-28 DIAGNOSIS — H409 Unspecified glaucoma: Secondary | ICD-10-CM

## 2022-02-28 DIAGNOSIS — E782 Mixed hyperlipidemia: Secondary | ICD-10-CM

## 2022-02-28 DIAGNOSIS — J45909 Unspecified asthma, uncomplicated: Secondary | ICD-10-CM

## 2022-02-28 MED ORDER — SIMBRINZA 1-0.2 % OP SUSP
OPHTHALMIC | 0 refills | Status: DC
  Filled 2022-02-28: qty 8, 38d supply, fill #0

## 2022-02-28 MED ORDER — HYDROCHLOROTHIAZIDE 12.5 MG PO CAPS
12.5000 mg | ORAL_CAPSULE | Freq: Every day | ORAL | 0 refills | Status: DC
  Filled 2022-02-28: qty 90, 90d supply, fill #0

## 2022-02-28 NOTE — Patient Instructions (Signed)
[name],  ?It was a pleasure speaking with you  ?Below is a summary of your health goals and care plan ? ? ?If you have any questions or concerns, please feel free to contact me either at the phone number below or with a MyChart message.  ? ?Keep up the good work! ? ?Cherre Robins, PharmD ?Clinical Pharmacist ?[facility] ?[place] ?[phone] (direct line)  ?[phone] (main office number) ? ?Chronic Care Management Care Plan ?Hypertension ?BP Readings from Last 3 Encounters:  ?12/01/21 136/85  ?06/21/21 131/68  ?05/12/21 131/74  ? ?Pharmacist Clinical Goal(s): ?Over the next 180 days, patient will work with PharmD and providers to maintain BP goal <140/90 ?Current regimen:  ?Hydrochlorothiazide 12.'5mg'$  daily ?Patient self care activities - Over the next 180 days, patient will: ?Maintain hypertension medication regimen.  ?Collaborated with pharmacy to update hydrochlorothiazide prescription for 90 days supply ? ?Hyperlipidemia/Hx of CVA ?Lab Results  ?Component Value Date/Time  ? [place] 82 05/12/2021 09:44 AM  ? LDLCALC 72 01/22/2020 10:37 AM  ? ?Pharmacist Clinical Goal(s): ?Over the next 180 days, patient will work with PharmD and providers to achieve LDL goal < 70 ?Current regimen:  ?Rosuvastatin '40mg'$  daily ?Aspirin '81mg'$  daily ?Interventions: ?Discussed limiting fat in diet and increasing exercise ?Patient self care activities - Over the next 180 days, patient will: ?Maintain cholesterol medication regimen. ?Limit intake of saturated fat / follow heart health diet.  ? ?Diabetes ?Lab Results  ?Component Value Date/Time  ? HGBA1C 6.4 05/12/2021 09:44 AM  ? HGBA1C 6.1 (H) 02/26/2020 03:54 PM  ? ?Pharmacist Clinical Goal(s): ?Over the next 180 days, patient will work with PharmD and providers to maintain A1c goal <7% ?Current regimen:  ?Diet and exercise management   ?Patient self care activities - Over the next 180 days, patient will: ?Maintain a1c <7% ? ?Reactive Airway  Disease ?Pharmacist Clinical Goal(s) ?Over the next 180 days, patient will work with PharmD and providers to reduce symptoms associated with reactive airway disease ?Current regimen:  ?Trelegy 1 puff daily ?Patient self care activities - Over the next 180 days, patient will: ?Maintain reactive airway disease medication regimen ?Requested refill for Trelegy - make sure to pick up at [facility].  ? ?Health Maintenance  ?Pharmacist Clinical Goal(s) ?Over the next 180 days, patient will work with PharmD and providers to complete health maintenance screenings/vaccinations ?Interventions: ?Made AWV appointment (last was 09/2019) ?Sent message to our office referral department to assist with ophthalmologist appointment. ? ? ?Medication management ?Pharmacist Clinical Goal(s): ?Over the next 180 days, patient will work with PharmD and providers to maintain optimal medication adherence ?Current pharmacy: [facility] ?Interventions ?Comprehensive medication review performed. ?Updated medication list to include vitamins patient is currently taking ?Continue current medication management strategy ?Patient self care activities - Over the next 180 days, patient will: ?Focus on medication adherence by filling and taking medications appropriately  ?Take medications as prescribed ?Report any questions or concerns to PharmD and/or provider(s) ? ? ? ?Follow Up Plan: Telephone follow up appointment with care management team member scheduled for:  3 months  and Next PCP appointment scheduled for:  May 2023.  ? ?The patient verbalized understanding of instructions, educational materials, and care plan provided today and agreed to receive a mailed copy of patient instructions, educational materials, and care plan.   ?

## 2022-02-28 NOTE — Chronic Care Management (AMB) (Signed)
? ? ?Chronic Care Management ?Pharmacy Note ? ?02/28/2022 ?Name:  Suzanne Ali MRN:  [id number] DOB:  1951/08/07 ? ?Summary:  ?Patient reports she has not been set up with a new ophthalmologist yet. Suzanne Ali sent in referral 12/01/2021 and looks like it was forwarded to [facility]. I called [facility] and representative said that they saw patient (Dr Jerline Pain) 10/2021 but that they do not take her duel Medicare / Medicaid coverage.  Sent message to our office referral department to see if we can locate an ophthalmologist that is able to see Mrs. Suzanne Ali.  ?Reviewed medication list for needed refills. Assisted patient in requesting refills for hydrochlorothiazide, Trelegy and Smibrinza.  ?Patient is also due to have labs rechecked (ordered in January but not drawn) - she has appointment with PCP in May so [place] to wait until then.  ?[facility] AWV - scheduled today for next week 03/07/2022. Patient requested to be a phone AWV.  ? ?Subjective: ?Rendy T Chesler is an 71 y.o. year old female who is a primary patient of Suzanne Alar, NP.  The CCM team was consulted for assistance with disease management and care coordination needs.   ? ?Engaged with patient by telephone for follow up visit in response to provider referral for pharmacy case management and/or care coordination services.  ? ?Consent to Services:  ?The patient was given information about Chronic Care Management services, agreed to services, and gave verbal consent prior to initiation of services.  Please see initial visit note for detailed documentation.  ? ?Patient Care Team: ?Suzanne Alar, NP as PCP - General (Internal Medicine) ?Fay Records, MD as PCP - Cardiology (Cardiology) ?Cherre Robins, RPH-CPP (Pharmacist) ? ?Recent office visits: ?12/01/2021 - Int Med Inda Castle, NP) Follow up chronic conditions. No med changes noted. Referral sent for new ophthalmologist. ?06/21/21 - PCP Inda Castle, NP) CPE. Ordered mammogram.  ?   ?Recent consult visits: ?None in the last 6 months.  ?  ?Hospital visits: ?None in the last 6 months.  ? ?Objective: ? ?Lab Results  ?Component Value Date  ? CREATININE 1.32 (H) 05/12/2021  ? CREATININE 1.20 (H) 02/19/2020  ? CREATININE 1.48 (H) 01/22/2020  ? ? ?Lab Results  ?Component Value Date  ? HGBA1C 6.4 05/12/2021  ? ?Last diabetic Eye exam:  ?Lab Results  ?Component Value Date/Time  ? HMDIABEYEEXA No Retinopathy 04/11/2021 12:00 AM  ?  ?Last diabetic Foot exam: No results found for: HMDIABFOOTEX  ? ?   ?Component Value Date/Time  ? CHOL 156 05/12/2021 0944  ? CHOL 142 01/22/2020 1037  ? TRIG 65.0 05/12/2021 0944  ? HDL 61.40 05/12/2021 0944  ? HDL 56 01/22/2020 1037  ? CHOLHDL 3 05/12/2021 0944  ? VLDL 13.0 05/12/2021 0944  ? [place] 82 05/12/2021 0944  ? [place] 72 01/22/2020 1037  ? ? ? ?  Latest Ref Rng & Units 05/12/2021  ?  9:44 AM 02/26/2020  ?  3:54 PM 11/16/2018  ?  8:13 PM  ?Hepatic Function  ?Total Protein 6.0 - 8.3 g/dL 7.0   7.2   7.7    ?Albumin 3.5 - 5.2 g/dL 4.2    4.0    ?AST 0 - 37 U/L $Remo'21   21   22    'yExbf$ ?ALT 0 - 35 U/L $Remo'16   14   15    'tudJU$ ?Alk Phosphatase 39 - 117 U/L 64    54    ?Total Bilirubin 0.2 - 1.2 mg/dL 0.6   0.6  0.7    ?Bilirubin, Direct 0.0 - 0.2 mg/dL  0.2     ? ? ?Lab Results  ?Component Value Date/Time  ? TSH 0.786 12/23/2012 11:28 AM  ? ? ? ?  Latest Ref Rng & Units 01/22/2020  ? 10:37 AM 11/16/2018  ?  8:13 PM 01/16/2016  ?  9:19 AM  ?CBC  ?WBC 3.4 - 10.8 x10E3/uL 9.6   8.9   10.6    ?Hemoglobin 11.1 - 15.9 g/dL 13.8   13.8   13.3    ?Hematocrit 34.0 - 46.6 % 42.1   44.6   40.1    ?Platelets 150 - 450 x10E3/uL 211   207   247.0    ? ? ?No results found for: VD25OH ? ?Clinical ASCVD: Yes  ?The 10-year ASCVD risk score (Arnett DK, et al., 2019) is: 25.3% ?  Values used to calculate the score: ?    Age: 48 years ?    Sex: Female ?    Is Non-Hispanic African American: Yes ?    Diabetic: Yes ?    Tobacco smoker: No ?    Systolic Blood Pressure: 295 mmHg ?    Is BP treated: Yes ?    HDL  Cholesterol: 61.4 mg/dL ?    Total Cholesterol: 156 mg/dL   ? ? ? ?Social History  ? ?Tobacco Use  ?Smoking Status Never  ?Smokeless Tobacco Never  ? ?BP Readings from Last 3 Encounters:  ?12/01/21 136/85  ?06/21/21 131/68  ?05/12/21 131/74  ? ?Pulse Readings from Last 3 Encounters:  ?12/01/21 78  ?06/21/21 79  ?05/12/21 77  ? ?Wt Readings from Last 3 Encounters:  ?12/01/21 207 lb (93.9 kg)  ?06/21/21 203 lb (92.1 kg)  ?05/12/21 205 lb (93 kg)  ? ? ?Assessment: Review of patient past medical history, allergies, medications, health status, including review of consultants reports, laboratory and other test data, was performed as part of comprehensive evaluation and provision of chronic care management services.  ? ?SDOH:  (Social Determinants of Health) assessments and interventions performed:  ? ? ? ?CCM Care Plan ? ?Allergies  ?Allergen Reactions  ? Lisinopril Swelling  ?  angioedema  ? Benicar [Olmesartan]   ?  hair loss, dry skin.  ? Latex   ?  Swelling/peeling skin  ? Shellfish Allergy   ? Penicillins Hives  ? Sulfa Antibiotics Hives  ? ? ?Medications Reviewed Today   ? ? Reviewed by Jiles Prows, CMA (Certified Medical Assistant) on 12/01/21 at [phone]  Med List Status: <None>  ? ?Medication Order Taking? Sig Documenting Provider Last Dose Status Informant  ?0.9 %  sodium chloride infusion [id number]   Ladene Artist, MD  Active   ?0.9 %  sodium chloride infusion [id number]   Ladene Artist, MD  Active   ?acetaminophen (TYLENOL) 325 MG tablet [id number] Yes Take 650 mg by mouth every 6 (six) hours as needed for mild pain, moderate pain or headache. [provider] Taking Active Self  ?albuterol (VENTOLIN HFA) 108 (90 Base) MCG/ACT inhaler [id number] Yes Inhale 2 puffs into the lungs every 6 (six) hours as needed for wheezing or shortness of breath. Suzanne Alar, NP Taking Active   ?aspirin 81 MG EC tablet [id number] Yes Take 1 tablet (81 mg total) by mouth daily. Swallow whole. Suzanne Alar,  NP Taking Active   ?Brinzolamide-Brimonidine (SIMBRINZA) 1-0.2 % SUSP [id number] Yes Place 1 drop into both eyes 2 (two) times daily.  Taking Active   ?ferrous sulfate 324  MG TBEC [id number] Yes Take 324 mg by mouth daily. [provider] Taking Active   ?Fluticasone-Umeclidin-Vilant (TRELEGY ELLIPTA) 100-62.5-25 MCG/ACT AEPB [id number] Yes Inhale 1 puff by mouth into the lungs daily Brand Males, MD Taking Active   ?Fluticasone-Umeclidin-Vilant (TRELEGY ELLIPTA) 100-62.5-25 MCG/INH AEPB [id number] Yes INHALE 1 PUFF BY MOUTH INTO THE LUNGS DAILY. Brand Males, MD Taking Active   ?hydrochlorothiazide (MICROZIDE) 12.5 MG capsule [id number] Yes Take 1 capsule (12.5 mg total) by mouth daily. Colon Branch, MD Taking Active   ?latanoprost (XALATAN) 0.005 % ophthalmic solution [id number] Yes Instill 1 drop in both eyes at bedtime  Taking Active   ?rosuvastatin (CRESTOR) 40 MG tablet [id number] Yes Take 1 tablet (40 mg total) by mouth daily. Suzanne Alar, NP Taking Active   ?vitamin C (ASCORBIC ACID) 500 MG tablet [id number] Yes Take 500 mg by mouth daily. [provider] Taking Active   ?Vitamin D, Cholecalciferol, 25 MCG (1000 UT) CAPS [id number] Yes Take 1 capsule by mouth daily. [provider] Taking Active   ?vitamin E 180 MG (400 UNITS) capsule [id number] Yes Take 400 Units by mouth daily. [provider] Taking Active   ? ?  ?  ? ?  ? ? ?Patient Active Problem List  ? Diagnosis Date Noted  ? Glaucoma 12/01/2021  ? Preventative health care 06/21/2021  ? Diabetes mellitus type 2, controlled, without complications ([place]) 70/96/4383  ? Multiple lung nodules on CT 10/27/2015  ? Syncope 10/18/2015  ? Reactive airways dysfunction syndrome, moderate persistent, uncomplicated ([place]) 81/84/0375  ? 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 Reactive airway disease 10/05/2015  ? History of CVA (cerebrovascular accident) 10/05/2015  ? Contact dermatitis 10/05/2015  ? Vision problem 05/20/2014  ?  Carpal tunnel syndrome 01/18/2014  ? Weight gain 10/27/2013  ? Asthma, chronic 12/11/2012  ? Hyperlipidemia 08/25/2012  ? Dysphagia 05/22/2012  ? Hypertension 05/14/2012  ? ? ?Immunization History  ?Administered

## 2022-03-06 ENCOUNTER — Other Ambulatory Visit (HOSPITAL_BASED_OUTPATIENT_CLINIC_OR_DEPARTMENT_OTHER): Payer: Self-pay

## 2022-03-06 ENCOUNTER — Ambulatory Visit (INDEPENDENT_AMBULATORY_CARE_PROVIDER_SITE_OTHER): Payer: Medicare Other

## 2022-03-06 DIAGNOSIS — Z780 Asymptomatic menopausal state: Secondary | ICD-10-CM

## 2022-03-06 DIAGNOSIS — Z0000 Encounter for general adult medical examination without abnormal findings: Secondary | ICD-10-CM | POA: Diagnosis not present

## 2022-03-06 NOTE — Patient Instructions (Signed)
Suzanne Ali , ?Thank you for taking time to come for your Medicare Wellness Visit. I appreciate your ongoing commitment to your health goals. Please review the following plan we discussed and let me know if I can assist you in the future.  ? ?Screening recommendations/referrals: ?Colonoscopy: 06/14/17 due 06/14/22 ?Mammogram: ordered 06/21/21 ?Bone Density: ordered 03/06/22 ?Recommended yearly ophthalmology/optometry visit for glaucoma screening and checkup ?Recommended yearly dental visit for hygiene and checkup ? ?Vaccinations: ?Influenza vaccine: up to date ?Pneumococcal vaccine: up to date ?Tdap vaccine: up to date ?Shingles vaccine: up to date   ?Covid-19:Due-May obtain vaccine at our local pharmacy.  ? ?Advanced directives: no, packet sent  ? ?Conditions/risks identified: see problem list ? ?Next appointment: Follow up in one year for your annual wellness visit 03/08/23 ? ? ?Preventive Care 54 Years and Older, Female ?Preventive care refers to lifestyle choices and visits with your health care provider that can promote health and wellness. ?What does preventive care include? ?A yearly physical exam. This is also called an annual well check. ?Dental exams once or twice a year. ?Routine eye exams. Ask your health care provider how often you should have your eyes checked. ?Personal lifestyle choices, including: ?Daily care of your teeth and gums. ?Regular physical activity. ?Eating a healthy diet. ?Avoiding tobacco and drug use. ?Limiting alcohol use. ?Practicing safe sex. ?Taking low-dose aspirin every day. ?Taking vitamin and mineral supplements as recommended by your health care provider. ?What happens during an annual well check? ?The services and screenings done by your health care provider during your annual well check will depend on your age, overall health, lifestyle risk factors, and family history of disease. ?Counseling  ?Your health care provider may ask you questions about your: ?Alcohol use. ?Tobacco  use. ?Drug use. ?Emotional well-being. ?Home and relationship well-being. ?Sexual activity. ?Eating habits. ?History of falls. ?Memory and ability to understand (cognition). ?Work and work Statistician. ?Reproductive health. ?Screening  ?You may have the following tests or measurements: ?Height, weight, and BMI. ?Blood pressure. ?Lipid and cholesterol levels. These may be checked every 5 years, or more frequently if you are over 37 years old. ?Skin check. ?Lung cancer screening. You may have this screening every year starting at age 26 if you have a 30-pack-year history of smoking and currently smoke or have quit within the past 15 years. ?Fecal occult blood test (FOBT) of the stool. You may have this test every year starting at age 73. ?Flexible sigmoidoscopy or colonoscopy. You may have a sigmoidoscopy every 5 years or a colonoscopy every 10 years starting at age 70. ?Hepatitis C blood test. ?Hepatitis B blood test. ?Sexually transmitted disease (STD) testing. ?Diabetes screening. This is done by checking your blood sugar (glucose) after you have not eaten for a while (fasting). You may have this done every 1-3 years. ?Bone density scan. This is done to screen for osteoporosis. You may have this done starting at age 61. ?Mammogram. This may be done every 1-2 years. Talk to your health care provider about how often you should have regular mammograms. ?Talk with your health care provider about your test results, treatment options, and if necessary, the need for more tests. ?Vaccines  ?Your health care provider may recommend certain vaccines, such as: ?Influenza vaccine. This is recommended every year. ?Tetanus, diphtheria, and acellular pertussis (Tdap, Td) vaccine. You may need a Td booster every 10 years. ?Zoster vaccine. You may need this after age 36. ?Pneumococcal 13-valent conjugate (PCV13) vaccine. One dose is recommended after age  65. ?Pneumococcal polysaccharide (PPSV23) vaccine. One dose is recommended  after age 71. ?Talk to your health care provider about which screenings and vaccines you need and how often you need them. ?This information is not intended to replace advice given to you by your health care provider. Make sure you discuss any questions you have with your health care provider. ?Document Released: 11/25/2015 Document Revised: 07/18/2016 Document Reviewed: 08/30/2015 ?Elsevier Interactive Patient Education ? 2017 [place]. ? ?Fall Prevention in the Home ?Falls can cause injuries. They can happen to people of all ages. There are many things you can do to make your home safe and to help prevent falls. ?What can I do on the outside of my home? ?Regularly fix the edges of walkways and driveways and fix any cracks. ?Remove anything that might make you trip as you walk through a door, such as a raised step or threshold. ?Trim any bushes or trees on the path to your home. ?Use bright outdoor lighting. ?Clear any walking paths of anything that might make someone trip, such as rocks or tools. ?Regularly check to see if handrails are loose or broken. Make sure that both sides of any steps have handrails. ?Any raised decks and porches should have guardrails on the edges. ?Have any leaves, snow, or ice cleared regularly. ?Use sand or salt on walking paths during winter. ?Clean up any spills in your garage right away. This includes oil or grease spills. ?What can I do in the bathroom? ?Use night lights. ?Install grab bars by the toilet and in the tub and shower. Do not use towel bars as grab bars. ?Use non-skid mats or decals in the tub or shower. ?If you need to sit down in the shower, use a plastic, non-slip stool. ?Keep the floor dry. Clean up any water that spills on the floor as soon as it happens. ?Remove soap buildup in the tub or shower regularly. ?Attach bath mats securely with double-sided non-slip rug tape. ?Do not have throw rugs and other things on the floor that can make you trip. ?What can I do  in the bedroom? ?Use night lights. ?Make sure that you have a light by your bed that is easy to reach. ?Do not use any sheets or blankets that are too big for your bed. They should not hang down onto the floor. ?Have a firm chair that has side arms. You can use this for support while you get dressed. ?Do not have throw rugs and other things on the floor that can make you trip. ?What can I do in the kitchen? ?Clean up any spills right away. ?Avoid walking on wet floors. ?Keep items that you use a lot in easy-to-reach places. ?If you need to reach something above you, use a strong step stool that has a grab bar. ?Keep electrical cords out of the way. ?Do not use floor polish or wax that makes floors slippery. If you must use wax, use non-skid floor wax. ?Do not have throw rugs and other things on the floor that can make you trip. ?What can I do with my stairs? ?Do not leave any items on the stairs. ?Make sure that there are handrails on both sides of the stairs and use them. Fix handrails that are broken or loose. Make sure that handrails are as long as the stairways. ?Check any carpeting to make sure that it is firmly attached to the stairs. Fix any carpet that is loose or worn. ?Avoid having throw rugs at  the top or bottom of the stairs. If you do have throw rugs, attach them to the floor with carpet tape. ?Make sure that you have a light switch at the top of the stairs and the bottom of the stairs. If you do not have them, ask someone to add them for you. ?What else can I do to help prevent falls? ?Wear shoes that: ?Do not have high heels. ?Have rubber bottoms. ?Are comfortable and fit you well. ?Are closed at the toe. Do not wear sandals. ?If you use a stepladder: ?Make sure that it is fully opened. Do not climb a closed stepladder. ?Make sure that both sides of the stepladder are locked into place. ?Ask someone to hold it for you, if possible. ?Clearly mark and make sure that you can see: ?Any grab bars or  handrails. ?First and last steps. ?Where the edge of each step is. ?Use tools that help you move around (mobility aids) if they are needed. These include: ?Canes. ?Walkers. ?Scooters. ?Crutches. ?Turn on the lights
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
## 2022-03-07 ENCOUNTER — Other Ambulatory Visit (HOSPITAL_BASED_OUTPATIENT_CLINIC_OR_DEPARTMENT_OTHER): Payer: Self-pay

## 2022-03-11 DIAGNOSIS — E782 Mixed hyperlipidemia: Secondary | ICD-10-CM | POA: Diagnosis not present

## 2022-03-11 DIAGNOSIS — I10 Essential (primary) hypertension: Secondary | ICD-10-CM | POA: Diagnosis not present

## 2022-03-11 DIAGNOSIS — H409 Unspecified glaucoma: Secondary | ICD-10-CM

## 2022-03-11 DIAGNOSIS — J45909 Unspecified asthma, uncomplicated: Secondary | ICD-10-CM

## 2022-03-30 ENCOUNTER — Telehealth: Payer: Self-pay | Admitting: *Deleted

## 2022-03-30 NOTE — Telephone Encounter (Signed)
Patient Name Zaliyah Hickam Patient DOB Mar 22, 2051 Call Type Message Only Information Provided Reason for Call Request to [facility] Appointment Initial Comment Cancel her appt for Monday. Patient request to speak to RN No Additional Comment Pls call back to reschedule Disp. Time Disposition Final User 03/30/2022 7:05:52 AM General Information Provided Yes Donato Heinz

## 2022-03-30 NOTE — Telephone Encounter (Signed)
Tried calling pt to reschedule but vm was full.  Appt cancelled for Monday.

## 2022-04-02 ENCOUNTER — Ambulatory Visit: Payer: Medicare Other | Admitting: Family

## 2022-04-20 ENCOUNTER — Emergency Department (HOSPITAL_BASED_OUTPATIENT_CLINIC_OR_DEPARTMENT_OTHER)
Admission: EM | Admit: 2022-04-20 | Discharge: 2022-04-20 | Disposition: A | Discharge status: Home / Self Care | Payer: Medicare Other | Attending: Emergency Medicine | Admitting: Emergency Medicine

## 2022-04-20 ENCOUNTER — Other Ambulatory Visit: Payer: Self-pay

## 2022-04-20 ENCOUNTER — Other Ambulatory Visit: Payer: Self-pay | Admitting: Family

## 2022-04-20 ENCOUNTER — Encounter (HOSPITAL_BASED_OUTPATIENT_CLINIC_OR_DEPARTMENT_OTHER): Payer: Self-pay

## 2022-04-20 ENCOUNTER — Other Ambulatory Visit (HOSPITAL_BASED_OUTPATIENT_CLINIC_OR_DEPARTMENT_OTHER): Payer: Self-pay

## 2022-04-20 DIAGNOSIS — Z7982 Long term (current) use of aspirin: Secondary | ICD-10-CM | POA: Diagnosis not present

## 2022-04-20 DIAGNOSIS — E860 Dehydration: Secondary | ICD-10-CM | POA: Diagnosis not present

## 2022-04-20 DIAGNOSIS — R42 Dizziness and giddiness: Secondary | ICD-10-CM

## 2022-04-20 DIAGNOSIS — Z91040 Latex allergy status: Secondary | ICD-10-CM | POA: Insufficient documentation

## 2022-04-20 LAB — COMPREHENSIVE METABOLIC PANEL
ALT: 12 U/L (ref 0–44)
AST: 24 U/L (ref 15–41)
Albumin: 4 g/dL (ref 3.5–5.0)
Alkaline Phosphatase: 69 U/L (ref 38–126)
Anion gap: 9 (ref 5–15)
BUN: 36 mg/dL — ABNORMAL HIGH (ref 8–23)
CO2: 25 mmol/L (ref 22–32)
Calcium: 9.5 mg/dL (ref 8.9–10.3)
Chloride: 105 mmol/L (ref 98–111)
Creatinine, Ser: 1.91 mg/dL — ABNORMAL HIGH (ref 0.44–1.00)
GFR, Estimated: 28 mL/min — ABNORMAL LOW (ref >60)
Glucose, Bld: 90 mg/dL (ref 70–99)
Potassium: 3.5 mmol/L (ref 3.5–5.1)
Sodium: 139 mmol/L (ref 135–145)
Total Bilirubin: 0.6 mg/dL (ref 0.3–1.2)
Total Protein: 8.1 g/dL (ref 6.5–8.1)

## 2022-04-20 LAB — CBC WITH DIFFERENTIAL/PLATELET
Abs Immature Granulocytes: 0.02 10*3/uL (ref 0.00–0.07)
Basophils Absolute: 0.1 10*3/uL (ref 0.0–0.1)
Basophils Relative: 1 %
Eosinophils Absolute: 0.2 10*3/uL (ref 0.0–0.5)
Eosinophils Relative: 2 %
HCT: 44.1 % (ref 36.0–46.0)
Hemoglobin: 14.5 g/dL (ref 12.0–15.0)
Immature Granulocytes: 0 %
Lymphocytes Relative: 24 %
Lymphs Abs: 2.4 10*3/uL (ref 0.7–4.0)
MCH: 27.3 pg (ref 26.0–34.0)
MCHC: 32.9 g/dL (ref 30.0–36.0)
MCV: 82.9 fL (ref 80.0–100.0)
Monocytes Absolute: 0.8 10*3/uL (ref 0.1–1.0)
Monocytes Relative: 8 %
Neutro Abs: 6.6 10*3/uL (ref 1.7–7.7)
Neutrophils Relative %: 65 %
Platelets: 211 10*3/uL (ref 150–400)
RBC: 5.32 MIL/uL — ABNORMAL HIGH (ref 3.87–5.11)
RDW: 16.1 % — ABNORMAL HIGH (ref 11.5–15.5)
WBC: 10.1 10*3/uL (ref 4.0–10.5)
nRBC: 0 % (ref 0.0–0.2)

## 2022-04-20 LAB — CBG MONITORING, ED: Glucose-Capillary: 101 mg/dL — ABNORMAL HIGH (ref 70–99)

## 2022-04-20 MED ORDER — SODIUM CHLORIDE 0.9 % IV BOLUS
1000.0000 mL | Freq: Once | INTRAVENOUS | Status: AC
  Administered 2022-04-20: 1000 mL via INTRAVENOUS

## 2022-04-20 MED ORDER — MECLIZINE HCL 25 MG PO TABS
25.0000 mg | ORAL_TABLET | Freq: Three times a day (TID) | ORAL | 0 refills | Status: AC | PRN
  Filled 2022-04-20: qty 30, 10d supply, fill #0

## 2022-04-20 MED ORDER — MECLIZINE HCL 25 MG PO TABS
25.0000 mg | ORAL_TABLET | Freq: Once | ORAL | Status: AC
  Administered 2022-04-20: 25 mg via ORAL
  Filled 2022-04-20: qty 1

## 2022-04-20 NOTE — ED Provider Notes (Signed)
[place] EMERGENCY DEPARTMENT Provider Note   CSN: [id number] Arrival date & time: 04/20/22  0856     History  Chief Complaint  Patient presents with   Dizziness    Suzanne Ali is a 71 y.o. female.  71 yo F with a chief complaints of feeling dizzy.  This occurred first thing in the morning when she woke up and tried to get out of bed.  She has trouble describing what it felt like but felt like she was dizzy and was unable to get up.  Since then she has felt fine.  She denies any dizziness currently.  Denies chest pain shortness of breath abdominal pain.  She denies headache or neck pain.  Denies difficulty with speech or swallowing denies one-sided numbness or weakness.  Denies trauma to the head or neck.  Denies any recent medication changes.  Feels like she is been eating and drinking normally.   Dizziness      Home Medications Prior to Admission medications   Medication Sig Start Date End Date Taking? Authorizing Provider  meclizine (ANTIVERT) 25 MG tablet Take 1 tablet (25 mg total) by mouth 3 (three) times daily as needed for dizziness. 04/20/22  Yes Deno Etienne, DO  acetaminophen (TYLENOL) 325 MG tablet Take 650 mg by mouth every 6 (six) hours as needed for mild pain, moderate pain or headache.    [provider]  albuterol (VENTOLIN HFA) 108 (90 Base) MCG/ACT inhaler Inhale 2 puffs into the lungs every 6 (six) hours as needed for wheezing or shortness of breath. 05/12/21   [name], NP  aspirin 81 MG EC tablet Take 1 tablet (81 mg total) by mouth daily. Swallow whole. 05/12/21   [name], NP  Brinzolamide-Brimonidine [facility]) 1-0.2 % SUSP Place 1 drop into both eyes 2 (two) times daily. 09/19/21     ferrous sulfate 324 MG TBEC Take 324 mg by mouth daily.    [provider]  Fluticasone-Umeclidin-Vilant (TRELEGY ELLIPTA) 100-62.5-25 MCG/ACT AEPB Inhale 1 puff by mouth into the lungs daily 01/15/22   [name], NP   hydrochlorothiazide (MICROZIDE) 12.5 MG capsule Take 1 capsule (12.5 mg total) by mouth daily. 02/28/22   Colon Branch, MD  latanoprost (XALATAN) 0.005 % ophthalmic solution Instill 1 drop in both eyes at bedtime 04/20/21     rosuvastatin (CRESTOR) 40 MG tablet Take 1 tablet (40 mg total) by mouth daily. 12/21/21   [name], NP  SIMBRINZA 1-0.2 % SUSP Instill 1 drop in both eyes twice a day 02/28/22     vitamin C (ASCORBIC ACID) 500 MG tablet Take 500 mg by mouth daily.    [provider]  Vitamin D, Cholecalciferol, 25 MCG (1000 UT) CAPS Take 1 capsule by mouth daily.    [provider]  vitamin E 180 MG (400 UNITS) capsule Take 400 Units by mouth daily.    [provider]      Allergies    Lisinopril, Benicar [olmesartan], Latex, Shellfish allergy, Penicillins, and Sulfa antibiotics    Review of Systems   Review of Systems  Neurological:  Positive for dizziness.    Physical Exam Updated Vital Signs BP (!) 149/96   Pulse 71   Temp 98.6 F (37 C)   Resp (!) 21   Ht '5\' 3"'$  (1.6 m)   Wt 91.9 kg   SpO2 100%   BMI 35.87 kg/m  Physical Exam Vitals and nursing note reviewed.  Constitutional:  General: She is not in acute distress.    Appearance: She is well-developed. She is not diaphoretic.  HENT:     Head: Normocephalic and atraumatic.  Eyes:     Pupils: Pupils are equal, round, and reactive to light.  Cardiovascular:     Rate and Rhythm: Normal rate and regular rhythm.     Heart sounds: No murmur heard.    No friction rub. No gallop.  Pulmonary:     Effort: Pulmonary effort is normal.     Breath sounds: No wheezing or rales.  Abdominal:     General: There is no distension.     Palpations: Abdomen is soft.     Tenderness: There is no abdominal tenderness.  Musculoskeletal:        General: No tenderness.     Cervical back: Normal range of motion and neck supple.  Skin:    General: Skin is warm and dry.  Neurological:     Mental  Status: She is alert and oriented to person, place, and time.     GCS: GCS eye subscore is 4. GCS verbal subscore is 5. GCS motor subscore is 6.     Cranial Nerves: Cranial nerves 2-12 are intact.     Sensory: Sensation is intact.     Motor: Motor function is intact.     Coordination: Coordination is intact.     Comments: Patient is able to sit up on the bed with her arms crossed without any truncal ataxia.  Finger-to-nose without issue heel-to-shin without issue.  Benign neurologic exam.  Psychiatric:        Behavior: Behavior normal.     ED Results / Procedures / Treatments   Labs (all labs ordered are listed, but only abnormal results are displayed) Labs Reviewed  CBC WITH DIFFERENTIAL/PLATELET - Abnormal; Notable for the following components:      Result Value   RBC 5.32 (*)    RDW 16.1 (*)    All other components within normal limits  COMPREHENSIVE METABOLIC PANEL - Abnormal; Notable for the following components:   BUN 36 (*)    Creatinine, Ser 1.91 (*)    GFR, Estimated 28 (*)    All other components within normal limits  CBG MONITORING, ED - Abnormal; Notable for the following components:   Glucose-Capillary 101 (*)    All other components within normal limits    EKG EKG Interpretation  Date/Time:  Friday April 20 2022 09:08:54 EDT Ventricular Rate:  86 PR Interval:  152 QRS Duration: 98 QT Interval:  375 QTC Calculation: 449 R Axis:   17 Text Interpretation: Sinus rhythm no wpw, prolonged qt or brugada No significant change since last tracing Confirmed by Deno Etienne [phone]) on 04/20/2022 9:22:47 AM  Radiology No results found.  Procedures Procedures    Medications Ordered in ED Medications  sodium chloride 0.9 % bolus 1,000 mL (1,000 mLs Intravenous New Bag/Given 04/20/22 0920)  meclizine (ANTIVERT) tablet 25 mg (25 mg Oral Given 04/20/22 9628)    ED Course/ Medical Decision Making/ A&P                           Medical Decision Making Amount and/or  Complexity of Data Reviewed Labs: ordered.   71 yo F with a chief complaints of dizziness.  This was very short-lived and occurred first thing when she woke up.  Could be orthostasis based on history.  She is currently asymptomatic.  Obtain a  laboratory evaluation to assess for anemia or electrolyte abnormality.  Bolus of IV fluids for possible dehydration.  Dose of meclizine.  Reassess.  Patient has mild bump in her renal function looks like she has a baseline of 1.3 and today is 1.9.  No significant anemia.  No leukocytosis.  Patient was able to ambulate independently.  She does tell me that she feels a little bit dizzy while she is up and moving.  She would like to go home at this point.  We will have her call her family doctor and see when they can see her in the office.  10:33 AM:  I have discussed the diagnosis/risks/treatment options with the patient and family.  Evaluation and diagnostic testing in the emergency department does not suggest an emergent condition requiring admission or immediate intervention beyond what has been performed at this time.  They will follow up with  PCP. We also discussed returning to the ED immediately if new or worsening sx occur. We discussed the sx which are most concerning (e.g., sudden worsening pain, fever, inability to tolerate by mouth, inability to ambulate one-sided numbness or weakness difficulty speech or swallowing.) that necessitate immediate return. Medications administered to the patient during their visit and any new prescriptions provided to the patient are listed below.  Medications given during this visit Medications  sodium chloride 0.9 % bolus 1,000 mL (1,000 mLs Intravenous New Bag/Given 04/20/22 0920)  meclizine (ANTIVERT) tablet 25 mg (25 mg Oral Given 04/20/22 4982)     The patient appears reasonably screen and/or stabilized for discharge and I doubt any other medical condition or other [facility] requiring further screening, evaluation, or  treatment in the ED at this time prior to discharge.          Final Clinical Impression(s) / ED Diagnoses Final diagnoses:  Dizziness  Dehydration    Rx / DC Orders ED Discharge Orders          Ordered    meclizine (ANTIVERT) 25 MG tablet  3 times daily PRN        04/20/22 1030              Sargent, DO 04/20/22 1033

## 2022-04-20 NOTE — ED Triage Notes (Signed)
Pt ambulatory to room with son Pt reports she woke up dizzy this morning at 0530 . Went to bed approx 9 pm without complaints. No complaints of pain

## 2022-04-20 NOTE — Discharge Instructions (Signed)
Eat and drink as well as you can for the next 48 hours.  I prescribed you a medicine that can help you with the sensation of dizziness.  As we discussed in the room your kidney function is worse than was checked last year.  Please call your family doctor and discuss this with them.  Hopefully they can have it rechecked within a week or 2.

## 2022-04-20 NOTE — ED Notes (Signed)
EDP at bedside  

## 2022-04-20 NOTE — Telephone Encounter (Signed)
Pt in ED- please advise.

## 2022-04-24 ENCOUNTER — Other Ambulatory Visit: Payer: Self-pay

## 2022-04-24 ENCOUNTER — Ambulatory Visit (INDEPENDENT_AMBULATORY_CARE_PROVIDER_SITE_OTHER): Payer: Medicare Other | Admitting: Family

## 2022-04-24 ENCOUNTER — Other Ambulatory Visit (HOSPITAL_BASED_OUTPATIENT_CLINIC_OR_DEPARTMENT_OTHER): Payer: Self-pay

## 2022-04-24 VITALS — BP 150/76 | HR 80 | Temp 97.9°F | Resp 16 | Wt 200.4 lb

## 2022-04-24 DIAGNOSIS — R42 Dizziness and giddiness: Secondary | ICD-10-CM | POA: Insufficient documentation

## 2022-04-24 DIAGNOSIS — I10 Essential (primary) hypertension: Secondary | ICD-10-CM

## 2022-04-24 DIAGNOSIS — E119 Type 2 diabetes mellitus without complications: Secondary | ICD-10-CM

## 2022-04-24 DIAGNOSIS — E782 Mixed hyperlipidemia: Secondary | ICD-10-CM | POA: Diagnosis not present

## 2022-04-24 LAB — BASIC METABOLIC PANEL
BUN: 34 mg/dL — ABNORMAL HIGH (ref 6–23)
CO2: 24 mEq/L (ref 19–32)
Calcium: 9.6 mg/dL (ref 8.4–10.5)
Chloride: 105 mEq/L (ref 96–112)
Creatinine, Ser: 1.75 mg/dL — ABNORMAL HIGH (ref 0.40–1.20)
GFR: 28.83 mL/min — ABNORMAL LOW (ref >60.00)
Glucose, Bld: 79 mg/dL (ref 70–99)
Potassium: 4 mEq/L (ref 3.5–5.1)
Sodium: 142 mEq/L (ref 135–145)

## 2022-04-24 LAB — HEMOGLOBIN A1C: Hgb A1c MFr Bld: 6 % (ref 4.6–6.5)

## 2022-04-24 MED ORDER — TRELEGY ELLIPTA 100-62.5-25 MCG/ACT IN AEPB
INHALATION_SPRAY | RESPIRATORY_TRACT | 5 refills | Status: DC
  Filled 2022-04-24: qty 60, 30d supply, fill #0
  Filled 2022-07-11 – 2022-07-20 (×2): qty 60, 30d supply, fill #1

## 2022-04-24 MED ORDER — HYDROCHLOROTHIAZIDE 25 MG PO TABS
25.0000 mg | ORAL_TABLET | Freq: Every day | ORAL | 1 refills | Status: DC
  Filled 2022-04-24: qty 90, 90d supply, fill #0

## 2022-04-24 NOTE — Assessment & Plan Note (Signed)
She is not checking sugars. Question if her sugar has been running higher causing increased thirst. Obtain follow up A1C.

## 2022-04-24 NOTE — Assessment & Plan Note (Signed)
Lab Results  Component Value Date   CHOL 156 05/12/2021   HDL 61.40 05/12/2021   LDLCALC 82 05/12/2021   TRIG 65.0 05/12/2021   CHOLHDL 3 05/12/2021   LDL at goal. Continue crestor.

## 2022-04-24 NOTE — Assessment & Plan Note (Signed)
BP Readings from Last 3 Encounters:  04/24/22 (!) 150/76  04/20/22 (!) 149/96  12/01/21 136/85   Uncontrolled. Increase hctz to '25mg'$  once daily.

## 2022-04-24 NOTE — Assessment & Plan Note (Signed)
Resolved. Her Cr was elevated in the ED suggesting that she was dehydrated.  Encouraged hydration.  Lab Results  Component Value Date   CREATININE 1.91 (H) 04/20/2022   Will repeat BMET today.

## 2022-04-24 NOTE — Patient Instructions (Signed)
Please increase your hctz to '25mg'$  once daily.  Complete lab work prior to leaving.
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
## 2022-04-25 ENCOUNTER — Telehealth: Payer: Self-pay | Admitting: Family

## 2022-04-25 DIAGNOSIS — N1832 Chronic kidney disease, stage 3b: Secondary | ICD-10-CM | POA: Insufficient documentation

## 2022-04-25 NOTE — Telephone Encounter (Signed)
Sugar is stable, but kidney function has worsened. I would like to set her up with a kidney specialist. She should stay well hydrated and avoid NSAIDS.    Sugar control looks good.

## 2022-04-26 ENCOUNTER — Telehealth: Payer: Self-pay

## 2022-04-26 ENCOUNTER — Telehealth: Payer: Self-pay | Admitting: *Deleted

## 2022-04-26 NOTE — Chronic Care Management (AMB) (Unsigned)
  Chronic Care Management   Outreach Note  04/26/2022 Name: Suzanne Ali MRN: [id number] DOB: 1951/05/18  [name] is a 71 y.o. year old female who is a primary care patient of [name], NP. I reached out to [place] by phone today in response to a referral sent by Ms. [name]'s primary care provider.  An unsuccessful telephone outreach was attempted today. The patient was referred to the case management team for assistance with care management and care coordination.   Follow Up Plan: A HIPAA compliant phone message was left for the patient providing contact information and requesting a return call.   Julian Hy, [facility] Management  Direct Dial: [phone]

## 2022-04-26 NOTE — Telephone Encounter (Signed)
Lvm for patient to call back about resutls

## 2022-04-26 NOTE — Telephone Encounter (Signed)
Telephone encounter was:  Unsuccessful.  04/26/2022 Name: Suzanne Ali MRN: [id number] DOB: [birth year]  Unsuccessful outbound call made today to assist with:  Transportation Needs   Outreach Attempt:  1st Attempt  A HIPAA compliant voice message was left requesting a return call.  Instructed patient to call back at [phone] at their earliest convenience.  [facility] Guide, Embedded Care Coordination [facility]  [place], [place] [address]  Main Phone: [phone]  E-mail: [email or web address]  Website: [email or web address]

## 2022-04-27 NOTE — Telephone Encounter (Signed)
Lvm  for patient to call back about results. 

## 2022-05-01 NOTE — Telephone Encounter (Signed)
Patient advised of results, referral and provider's comments. She verbalized understanding

## 2022-05-02 ENCOUNTER — Ambulatory Visit: Payer: Medicare Other | Admitting: Family

## 2022-05-03 ENCOUNTER — Telehealth: Payer: Self-pay

## 2022-05-03 NOTE — Telephone Encounter (Signed)
Telephone encounter was:  Unsuccessful.  05/03/2022 Name: Suzanne Ali MRN: [id number] DOB: 15-Aug-1951  Unsuccessful outbound call made today to assist with:  Transportation Needs   Outreach Attempt:  2nd Attempt  A HIPAA compliant voice message was left requesting a return call.  Instructed patient to call back at [phone] at their earliest convenience.  [facility] Guide, Embedded Care Coordination [facility]  [place], [place] [address]  Main Phone: [phone]  E-mail: [email or web address]  Website: [email or web address]

## 2022-05-04 ENCOUNTER — Telehealth: Payer: Self-pay

## 2022-05-07 ENCOUNTER — Encounter: Payer: Self-pay | Admitting: Family

## 2022-05-07 ENCOUNTER — Ambulatory Visit (INDEPENDENT_AMBULATORY_CARE_PROVIDER_SITE_OTHER): Payer: Medicare Other | Admitting: Family

## 2022-05-07 VITALS — BP 138/72 | HR 72 | Temp 98.1°F | Resp 16 | Wt 199.6 lb

## 2022-05-07 DIAGNOSIS — I10 Essential (primary) hypertension: Secondary | ICD-10-CM

## 2022-05-07 LAB — BASIC METABOLIC PANEL
BUN: 32 mg/dL — ABNORMAL HIGH (ref 6–23)
CO2: 26 mEq/L (ref 19–32)
Calcium: 9.6 mg/dL (ref 8.4–10.5)
Chloride: 106 mEq/L (ref 96–112)
Creatinine, Ser: 1.67 mg/dL — ABNORMAL HIGH (ref 0.40–1.20)
GFR: 30.49 mL/min — ABNORMAL LOW (ref >60.00)
Glucose, Bld: 87 mg/dL (ref 70–99)
Potassium: 4.7 mEq/L (ref 3.5–5.1)
Sodium: 142 mEq/L (ref 135–145)

## 2022-05-16 ENCOUNTER — Ambulatory Visit (INDEPENDENT_AMBULATORY_CARE_PROVIDER_SITE_OTHER): Payer: Medicare Other | Admitting: Pharmacist

## 2022-05-16 ENCOUNTER — Telehealth: Payer: Self-pay

## 2022-05-16 DIAGNOSIS — E782 Mixed hyperlipidemia: Secondary | ICD-10-CM

## 2022-05-16 DIAGNOSIS — J45909 Unspecified asthma, uncomplicated: Secondary | ICD-10-CM

## 2022-05-16 DIAGNOSIS — E119 Type 2 diabetes mellitus without complications: Secondary | ICD-10-CM

## 2022-05-16 DIAGNOSIS — N1832 Chronic kidney disease, stage 3b: Secondary | ICD-10-CM

## 2022-05-16 DIAGNOSIS — I10 Essential (primary) hypertension: Secondary | ICD-10-CM

## 2022-05-16 NOTE — Telephone Encounter (Signed)
Telephone encounter was:  Unsuccessful.  05/16/2022 Name: Suzanne Ali MRN: [id number] DOB: 12/24/1950  Unsuccessful outbound call made today to assist with:  Transportation Needs   Outreach Attempt:  1st Attempt f/u  A HIPAA compliant voice message was left requesting a return call.  Instructed patient to call back at [phone] at their earliest convenience.  [facility] Guide, Embedded Care Coordination [facility]  [place], [place] [address]  Main Phone: [phone]  E-mail: [email or web address]  Website: [email or web address]

## 2022-05-16 NOTE — Patient Instructions (Signed)
Ms. Medley It was a pleasure speaking with you  Below is a summary of your health goals and care plan  Patient Goals/Self-Care Activities take medications as prescribed  target a minimum of 150 minutes of moderate intensity exercise weekly Reminded to rinse mouth / gargle after each use of Trelegy to prevent thrush.  Limit intake of sugar and foods high in carbohydrates (bread, pasta, rice and potatoes) Limit intake of sodium - recommended less than '2300mg'$  per day Call Care coordination team to scheduled future transportation for medical appointments - [place] [phone]  If you have any questions or concerns, please feel free to contact me either at the phone number below or with a MyChart message.   Keep up the good work!  Cherre Robins, PharmD Clinical Pharmacist [place] [phone] (direct line)  [phone] (main office number)   The patient verbalized understanding of instructions, educational materials, and care plan provided today and agreed to receive a mailed copy of patient instructions, educational materials, and care plan.

## 2022-05-16 NOTE — Chronic Care Management (AMB) (Signed)
  Chronic Care Management Pharmacy Note  05/16/2022 Name:  Suzanne Ali MRN:  [id number] DOB:  01/23/1951  Summary:  Patient reports she has not been set up with a new ophthalmologist yet. Referral from 11/2021 was sent to [facility] but they do no take patient's insurance. Referral has been closed out. Will ask PCP to sent in new referral.  Patient is taking hydrochlorothiazide and has had recent decrease in eGFR. 04/24/2022 eGFR was 28 but was rechecked 05/07/2022 and eGFR improved to 30.49; hydrochlorothiazide can lose effect when eGFR is below 30 mL/min - consider change to furosemide if eGFR drops again.  Discussed hydration since patient was recently at ED and found to be hydrated. Encourage her to drink a glass of water with meal and to have water to sip during the day.  Patient said recent transportation services were helpful. Provided number for care coordination - Ja'Mecha Davenport if she has further questions or need [phone].     Subjective: Suzanne Ali is an 71 y.o. year old female who is a primary patient of O'Sullivan, Melissa, NP.  The CCM team was consulted for assistance with disease management and care coordination needs.    Engaged with patient by telephone for follow up visit in response to provider referral for pharmacy case management and/or care coordination services.   Consent to Services:  The patient was given information about Chronic Care Management services, agreed to services, and gave verbal consent prior to initiation of services.  Please see initial visit note for detailed documentation.   Patient Care Team: O'Sullivan, Melissa, NP as PCP - General (Internal Medicine) Ross, Paula V, MD as PCP - Cardiology (Cardiology) Eckard, Tammy, RPH-CPP (Pharmacist)  Recent office visits: 05/07/2022 - Int Med (O'Sullivan, NP) F/U HTN. Improved BP with recent increase in HCTZ dose.  04/25/2022 - Phone Call - Scr increased. Referred to nephrologist.   04/24/2022 - Int Med (O'Sullivan, NP) Follow up chronic conditions. BP elevated. HCTZ increased to 25mg daily.  12/01/2021 - Int Med (O'Sullivan, NP) Follow up chronic conditions. No med changes noted. Referral sent for new ophthalmologist. 06/21/21 - PCP (O'Sullivan, NP) CPE. Ordered mammogram.    Recent consult visits: None in the last 6 months.    Hospital visits: 04/20/2022 - ED Visit for dizziness and dehydration. Given IV fluids and prescribed meclizine 25mg 3 times a day as needed.   Objective:  Lab Results  Component Value Date   CREATININE 1.67 (H) 05/07/2022   CREATININE 1.75 (H) 04/24/2022   CREATININE 1.91 (H) 04/20/2022    Lab Results  Component Value Date   HGBA1C 6.0 04/24/2022   Last diabetic Eye exam:  Lab Results  Component Value Date/Time   HMDIABEYEEXA No Retinopathy 04/11/2021 12:00 AM    Last diabetic Foot exam: No results found for: "HMDIABFOOTEX"      Component Value Date/Time   CHOL 156 05/12/2021 0944   CHOL 142 01/22/2020 1037   TRIG 65.0 05/12/2021 0944   HDL 61.40 05/12/2021 0944   HDL 56 01/22/2020 1037   CHOLHDL 3 05/12/2021 0944   VLDL 13.0 05/12/2021 0944   LDLCALC 82 05/12/2021 0944   LDLCALC 72 01/22/2020 1037       Latest Ref Rng & Units 04/20/2022    9:13 AM 05/12/2021    9:44 AM 02/26/2020    3:54 PM  Hepatic Function  Total Protein 6.5 - 8.1 g/dL 8.1  7.0  7.2   Albumin 3.5 - 5.0 g/dL 4.0  4.2        Chronic Care Management Pharmacy Note  05/16/2022 Name:  Suzanne Ali MRN:  [id number] DOB:  01/23/1951  Summary:  Patient reports she has not been set up with a new ophthalmologist yet. Referral from 11/2021 was sent to [facility] but they do no take patient's insurance. Referral has been closed out. Will ask PCP to sent in new referral.  Patient is taking hydrochlorothiazide and has had recent decrease in eGFR. 04/24/2022 eGFR was 28 but was rechecked 05/07/2022 and eGFR improved to 30.49; hydrochlorothiazide can lose effect when eGFR is below 30 mL/min - consider change to furosemide if eGFR drops again.  Discussed hydration since patient was recently at ED and found to be hydrated. Encourage her to drink a glass of water with meal and to have water to sip during the day.  Patient said recent transportation services were helpful. Provided number for care coordination - Ja'Mecha Davenport if she has further questions or need [phone].     Subjective: Suzanne Ali is an 71 y.o. year old female who is a primary patient of O'Sullivan, Melissa, NP.  The CCM team was consulted for assistance with disease management and care coordination needs.    Engaged with patient by telephone for follow up visit in response to provider referral for pharmacy case management and/or care coordination services.   Consent to Services:  The patient was given information about Chronic Care Management services, agreed to services, and gave verbal consent prior to initiation of services.  Please see initial visit note for detailed documentation.   Patient Care Team: O'Sullivan, Melissa, NP as PCP - General (Internal Medicine) Ross, Paula V, MD as PCP - Cardiology (Cardiology) , , RPH-CPP (Pharmacist)  Recent office visits: 05/07/2022 - Int Med (O'Sullivan, NP) F/U HTN. Improved BP with recent increase in HCTZ dose.  04/25/2022 - Phone Call - Scr increased. Referred to nephrologist.   04/24/2022 - Int Med (O'Sullivan, NP) Follow up chronic conditions. BP elevated. HCTZ increased to 25mg daily.  12/01/2021 - Int Med (O'Sullivan, NP) Follow up chronic conditions. No med changes noted. Referral sent for new ophthalmologist. 06/21/21 - PCP (O'Sullivan, NP) CPE. Ordered mammogram.    Recent consult visits: None in the last 6 months.    Hospital visits: 04/20/2022 - ED Visit for dizziness and dehydration. Given IV fluids and prescribed meclizine 25mg 3 times a day as needed.   Objective:  Lab Results  Component Value Date   CREATININE 1.67 (H) 05/07/2022   CREATININE 1.75 (H) 04/24/2022   CREATININE 1.91 (H) 04/20/2022    Lab Results  Component Value Date   HGBA1C 6.0 04/24/2022   Last diabetic Eye exam:  Lab Results  Component Value Date/Time   HMDIABEYEEXA No Retinopathy 04/11/2021 12:00 AM    Last diabetic Foot exam: No results found for: "HMDIABFOOTEX"      Component Value Date/Time   CHOL 156 05/12/2021 0944   CHOL 142 01/22/2020 1037   TRIG 65.0 05/12/2021 0944   HDL 61.40 05/12/2021 0944   HDL 56 01/22/2020 1037   CHOLHDL 3 05/12/2021 0944   VLDL 13.0 05/12/2021 0944   LDLCALC 82 05/12/2021 0944   LDLCALC 72 01/22/2020 1037       Latest Ref Rng & Units 04/20/2022    9:13 AM 05/12/2021    9:44 AM 02/26/2020    3:54 PM  Hepatic Function  Total Protein 6.5 - 8.1 g/dL 8.1  7.0  7.2   Albumin 3.5 - 5.0 g/dL 4.0  4.2      Chronic Care Management Pharmacy Note  05/16/2022 Name:  Suzanne Ali MRN:  [id number] DOB:  03-09-1951  Summary:  Patient reports she has not been set up with a new ophthalmologist yet. Referral from 11/2021 was sent to [facility] but they do no take patient's insurance. Referral has been closed out. Will ask PCP to sent in new referral.  Patient is taking hydrochlorothiazide and has had recent decrease in eGFR. 04/24/2022 eGFR was 28 but was rechecked 05/07/2022 and eGFR improved to 30.49; hydrochlorothiazide can lose effect when eGFR is below 30 mL/min - consider change to furosemide if eGFR drops again.  Discussed hydration since patient was recently at ED and found to be hydrated. Encourage her to drink a glass of water with meal and to have water to sip during the day.  Patient said recent transportation services were helpful. Provided number for care coordination - Ja'Mecha Melina Fiddler if she has further questions or need [phone].     Subjective: Madell T Kramm is an 71 y.o. year old female who is a primary patient of [name], NP.  The CCM team was consulted for assistance with disease management and care coordination needs.    Engaged with patient by telephone for follow up visit in response to provider referral for pharmacy case management and/or care coordination services.   Consent to Services:  The patient was given information about Chronic Care Management services, agreed to services, and gave verbal consent prior to initiation of services.  Please see initial visit note for detailed documentation.   Patient Care Team: [name], NP as PCP - General (Internal Medicine) Fay Records, MD as PCP - Cardiology (Cardiology) Cherre Robins, RPH-CPP (Pharmacist)  Recent office visits: 05/07/2022 - Int Med Inda Castle, NP) F/U HTN. Improved BP with recent increase in HCTZ dose.  04/25/2022 - Phone Call - Scr increased. Referred to nephrologist.   04/24/2022 - Int Med Inda Castle, NP) Follow up chronic conditions. BP elevated. HCTZ increased to 89m daily.  12/01/2021 - Int Med (Inda Castle NP) Follow up chronic conditions. No med changes noted. Referral sent for new ophthalmologist. 06/21/21 - PCP (Inda Castle NP) CPE. Ordered mammogram.    Recent consult visits: None in the last 6 months.    Hospital visits: 04/20/2022 - ED Visit for dizziness and dehydration. Given IV fluids and prescribed meclizine 239m3 times a day as needed.   Objective:  Lab Results  Component Value Date   CREATININE 1.67 (H) 05/07/2022   CREATININE 1.75 (H) 04/24/2022   CREATININE 1.91 (H) 04/20/2022    Lab Results  Component Value Date   HGBA1C 6.0 04/24/2022   Last diabetic Eye exam:  Lab Results  Component Value Date/Time   HMDIABEYEEXA No Retinopathy 04/11/2021 12:00 AM    Last diabetic Foot exam: No results found for: "HMDIABFOOTEX"      Component Value Date/Time   CHOL 156 05/12/2021 0944   CHOL 142 01/22/2020 1037   TRIG 65.0 05/12/2021 0944   HDL 61.40 05/12/2021 0944   HDL 56 01/22/2020 1037   CHOLHDL 3 05/12/2021 0944   VLDL 13.0 05/12/2021 0944   LDLCALC 82 05/12/2021 0944   LDLCALC 72 01/22/2020 1037       Latest Ref Rng & Units 04/20/2022    9:13 AM 05/12/2021    9:44 AM 02/26/2020    3:54 PM  Hepatic Function  Total Protein 6.5 - 8.1 g/dL 8.1  7.0  7.2   Albumin 3.5 - 5.0 g/dL 4.0  4.2  Chronic Care Management Pharmacy Note  05/16/2022 Name:  Suzanne Ali MRN:  [id number] DOB:  03/27/1951  Summary:  Patient reports she has not been set up with a new ophthalmologist yet. Referral from 11/2021 was sent to [facility] but they do no take patient's insurance. Referral has been closed out. Will ask PCP to sent in new referral.  Patient is taking hydrochlorothiazide and has had recent decrease in eGFR. 04/24/2022 eGFR was 28 but was rechecked 05/07/2022 and eGFR improved to 30.49; hydrochlorothiazide can lose effect when eGFR is below 30 mL/min - consider change to furosemide if eGFR drops again.  Discussed hydration since patient was recently at ED and found to be hydrated. Encourage her to drink a glass of water with meal and to have water to sip during the day.  Patient said recent transportation services were helpful. Provided number for care coordination - Ja'Mecha Melina Fiddler if she has further questions or need [phone].     Subjective: Suzanne Ali is an 71 y.o. year old female who is a primary patient of [name], NP.  The CCM team was consulted for assistance with disease management and care coordination needs.    Engaged with patient by telephone for follow up visit in response to provider referral for pharmacy case management and/or care coordination services.   Consent to Services:  The patient was given information about Chronic Care Management services, agreed to services, and gave verbal consent prior to initiation of services.  Please see initial visit note for detailed documentation.   Patient Care Team: [name], NP as PCP - General (Internal Medicine) Fay Records, MD as PCP - Cardiology (Cardiology) Cherre Robins, RPH-CPP (Pharmacist)  Recent office visits: 05/07/2022 - Int Med Inda Castle, NP) F/U HTN. Improved BP with recent increase in HCTZ dose.  04/25/2022 - Phone Call - Scr increased. Referred to nephrologist.   04/24/2022 - Int Med Inda Castle, NP) Follow up chronic conditions. BP elevated. HCTZ increased to 76m daily.  12/01/2021 - Int Med (Inda Castle NP) Follow up chronic conditions. No med changes noted. Referral sent for new ophthalmologist. 06/21/21 - PCP (Inda Castle NP) CPE. Ordered mammogram.    Recent consult visits: None in the last 6 months.    Hospital visits: 04/20/2022 - ED Visit for dizziness and dehydration. Given IV fluids and prescribed meclizine 23m3 times a day as needed.   Objective:  Lab Results  Component Value Date   CREATININE 1.67 (H) 05/07/2022   CREATININE 1.75 (H) 04/24/2022   CREATININE 1.91 (H) 04/20/2022    Lab Results  Component Value Date   HGBA1C 6.0 04/24/2022   Last diabetic Eye exam:  Lab Results  Component Value Date/Time   HMDIABEYEEXA No Retinopathy 04/11/2021 12:00 AM    Last diabetic Foot exam: No results found for: "HMDIABFOOTEX"      Component Value Date/Time   CHOL 156 05/12/2021 0944   CHOL 142 01/22/2020 1037   TRIG 65.0 05/12/2021 0944   HDL 61.40 05/12/2021 0944   HDL 56 01/22/2020 1037   CHOLHDL 3 05/12/2021 0944   VLDL 13.0 05/12/2021 0944   LDLCALC 82 05/12/2021 0944   LDLCALC 72 01/22/2020 1037       Latest Ref Rng & Units 04/20/2022    9:13 AM 05/12/2021    9:44 AM 02/26/2020    3:54 PM  Hepatic Function  Total Protein 6.5 - 8.1 g/dL 8.1  7.0  7.2   Albumin 3.5 - 5.0 g/dL 4.0  4.2  Chronic Care Management Pharmacy Note  05/16/2022 Name:  Suzanne Ali MRN:  [id number] DOB:  03/27/1951  Summary:  Patient reports she has not been set up with a new ophthalmologist yet. Referral from 11/2021 was sent to [facility] but they do no take patient's insurance. Referral has been closed out. Will ask PCP to sent in new referral.  Patient is taking hydrochlorothiazide and has had recent decrease in eGFR. 04/24/2022 eGFR was 28 but was rechecked 05/07/2022 and eGFR improved to 30.49; hydrochlorothiazide can lose effect when eGFR is below 30 mL/min - consider change to furosemide if eGFR drops again.  Discussed hydration since patient was recently at ED and found to be hydrated. Encourage her to drink a glass of water with meal and to have water to sip during the day.  Patient said recent transportation services were helpful. Provided number for care coordination - Ja'Mecha Melina Fiddler if she has further questions or need [phone].     Subjective: Suzanne Ali is an 71 y.o. year old female who is a primary patient of [name], NP.  The CCM team was consulted for assistance with disease management and care coordination needs.    Engaged with patient by telephone for follow up visit in response to provider referral for pharmacy case management and/or care coordination services.   Consent to Services:  The patient was given information about Chronic Care Management services, agreed to services, and gave verbal consent prior to initiation of services.  Please see initial visit note for detailed documentation.   Patient Care Team: [name], NP as PCP - General (Internal Medicine) Fay Records, MD as PCP - Cardiology (Cardiology) Cherre Robins, RPH-CPP (Pharmacist)  Recent office visits: 05/07/2022 - Int Med Inda Castle, NP) F/U HTN. Improved BP with recent increase in HCTZ dose.  04/25/2022 - Phone Call - Scr increased. Referred to nephrologist.   04/24/2022 - Int Med Inda Castle, NP) Follow up chronic conditions. BP elevated. HCTZ increased to 76m daily.  12/01/2021 - Int Med (Inda Castle NP) Follow up chronic conditions. No med changes noted. Referral sent for new ophthalmologist. 06/21/21 - PCP (Inda Castle NP) CPE. Ordered mammogram.    Recent consult visits: None in the last 6 months.    Hospital visits: 04/20/2022 - ED Visit for dizziness and dehydration. Given IV fluids and prescribed meclizine 23m3 times a day as needed.   Objective:  Lab Results  Component Value Date   CREATININE 1.67 (H) 05/07/2022   CREATININE 1.75 (H) 04/24/2022   CREATININE 1.91 (H) 04/20/2022    Lab Results  Component Value Date   HGBA1C 6.0 04/24/2022   Last diabetic Eye exam:  Lab Results  Component Value Date/Time   HMDIABEYEEXA No Retinopathy 04/11/2021 12:00 AM    Last diabetic Foot exam: No results found for: "HMDIABFOOTEX"      Component Value Date/Time   CHOL 156 05/12/2021 0944   CHOL 142 01/22/2020 1037   TRIG 65.0 05/12/2021 0944   HDL 61.40 05/12/2021 0944   HDL 56 01/22/2020 1037   CHOLHDL 3 05/12/2021 0944   VLDL 13.0 05/12/2021 0944   LDLCALC 82 05/12/2021 0944   LDLCALC 72 01/22/2020 1037       Latest Ref Rng & Units 04/20/2022    9:13 AM 05/12/2021    9:44 AM 02/26/2020    3:54 PM  Hepatic Function  Total Protein 6.5 - 8.1 g/dL 8.1  7.0  7.2   Albumin 3.5 - 5.0 g/dL 4.0  4.2

## 2022-05-29 ENCOUNTER — Other Ambulatory Visit (HOSPITAL_BASED_OUTPATIENT_CLINIC_OR_DEPARTMENT_OTHER): Payer: Self-pay

## 2022-06-04 ENCOUNTER — Telehealth: Payer: Medicare Other

## 2022-06-11 DIAGNOSIS — N1832 Chronic kidney disease, stage 3b: Secondary | ICD-10-CM

## 2022-06-11 DIAGNOSIS — I10 Essential (primary) hypertension: Secondary | ICD-10-CM | POA: Diagnosis not present

## 2022-06-11 DIAGNOSIS — J45909 Unspecified asthma, uncomplicated: Secondary | ICD-10-CM | POA: Diagnosis not present

## 2022-06-11 DIAGNOSIS — E119 Type 2 diabetes mellitus without complications: Secondary | ICD-10-CM | POA: Diagnosis not present

## 2022-06-11 DIAGNOSIS — E782 Mixed hyperlipidemia: Secondary | ICD-10-CM | POA: Diagnosis not present

## 2022-07-03 ENCOUNTER — Telehealth: Payer: Self-pay | Admitting: Family

## 2022-07-03 NOTE — Telephone Encounter (Signed)
Medication:   Blood Pressure meds  Has the patient contacted their pharmacy? No. (If no, request that the patient contact the pharmacy for the refill.) (If yes, when and what did the pharmacy advise?)  Preferred Pharmacy (with phone number or street name):   [facility]  [address] [place], [place] [address]  Phone:  [phone]  Fax:  [phone]   Agent: Please be advised that RX refills may take up to 3 business days. We ask that you follow-up with your pharmacy.

## 2022-07-09 ENCOUNTER — Other Ambulatory Visit: Payer: Self-pay

## 2022-07-09 ENCOUNTER — Other Ambulatory Visit (HOSPITAL_BASED_OUTPATIENT_CLINIC_OR_DEPARTMENT_OTHER): Payer: Self-pay

## 2022-07-09 DIAGNOSIS — I10 Essential (primary) hypertension: Secondary | ICD-10-CM

## 2022-07-09 MED ORDER — ROSUVASTATIN CALCIUM 40 MG PO TABS
40.0000 mg | ORAL_TABLET | Freq: Every day | ORAL | 1 refills | Status: DC
  Filled 2022-07-09 – 2022-07-20 (×2): qty 90, 90d supply, fill #0
  Filled 2022-10-11: qty 90, 90d supply, fill #1

## 2022-07-09 MED ORDER — HYDROCHLOROTHIAZIDE 25 MG PO TABS
25.0000 mg | ORAL_TABLET | Freq: Every day | ORAL | 1 refills | Status: DC
  Filled 2022-07-09 – 2022-07-20 (×2): qty 90, 90d supply, fill #0
  Filled 2022-10-11: qty 90, 90d supply, fill #1

## 2022-07-09 NOTE — Telephone Encounter (Signed)
Rx sent 

## 2022-07-11 ENCOUNTER — Other Ambulatory Visit (HOSPITAL_BASED_OUTPATIENT_CLINIC_OR_DEPARTMENT_OTHER): Payer: Self-pay

## 2022-07-11 ENCOUNTER — Ambulatory Visit: Payer: Medicare Other | Admitting: Pharmacist

## 2022-07-11 DIAGNOSIS — E119 Type 2 diabetes mellitus without complications: Secondary | ICD-10-CM

## 2022-07-11 DIAGNOSIS — E782 Mixed hyperlipidemia: Secondary | ICD-10-CM

## 2022-07-11 DIAGNOSIS — H409 Unspecified glaucoma: Secondary | ICD-10-CM

## 2022-07-11 DIAGNOSIS — N1832 Chronic kidney disease, stage 3b: Secondary | ICD-10-CM

## 2022-07-11 DIAGNOSIS — I10 Essential (primary) hypertension: Secondary | ICD-10-CM

## 2022-07-11 MED ORDER — ALBUTEROL SULFATE HFA 108 (90 BASE) MCG/ACT IN AERS
2.0000 | INHALATION_SPRAY | Freq: Four times a day (QID) | RESPIRATORY_TRACT | 2 refills | Status: DC | PRN
  Filled 2022-07-11: qty 6.7, 25d supply, fill #0
  Filled 2022-07-20: qty 6.7, 20d supply, fill #0
  Filled 2022-09-14 – 2022-10-11 (×2): qty 6.7, 20d supply, fill #1

## 2022-07-11 NOTE — Chronic Care Management (AMB) (Signed)
Chronic Care Management Pharmacy Note  07/18/2022 Name:  Suzanne Ali MRN:  [id number] DOB:  [birth year]  Summary:  Patient reports she has not been set up with a new ophthalmologist yet. Patient is not currently using any eye drops for glaucoma. Requested notes from last OV with opthalmologist from 10/20/2021. Patient was to continue Simbrinza BID in both eyes. Referral from 11/2021 was sent to [facility] but they do no take patient's insurance. Referral has been closed out. Called to see if Dr Manuella Ghazi takes patient's [facility]. Reported that do but needs last ophthalmology notes. Faxed  last ophthalmology notes to Dr Trena Platt office - [facility] on [address]  Patient is taking hydrochlorothiazide and has had recent decrease in eGFR. 04/24/2022 eGFR was 28 but was rechecked 05/07/2022 and eGFR improved to 30.49; hydrochlorothiazide can lose effect when eGFR is below 30 mL/min - consider change to furosemide if eGFR drops again.  Discussed hydration since patient was recently at ED and found to be hydrated. Encourage her to drink a glass of water with meal and to have water to sip during the day.  Patient said recent transportation services were helpful. Provided number for care coordination - Ja'Mecha Melina Fiddler if she has further questions or need [phone].    Assisted patient in requesting refills for Trelegy, albuterol and rosuvastatin from [place].   Subjective: Suzanne Ali is an 71 y.o. year old female who is a primary patient of [name], NP.  The CCM team was consulted for assistance with disease management and care coordination needs.    Engaged with patient by telephone for follow up visit in response to provider referral for pharmacy case management and/or care coordination services.   Consent to Services:  The patient was given information about Chronic Care Management services, agreed to services, and gave verbal consent prior to  initiation of services.  Please see initial visit note for detailed documentation.   Patient Care Team: [name], NP as PCP - General (Internal Medicine) Fay Records, MD as PCP - Cardiology (Cardiology) Cherre Robins, RPH-CPP (Pharmacist)  Recent office visits: 05/07/2022 - Int Med Inda Castle, NP) F/U HTN. Improved BP with recent increase in HCTZ dose.  04/25/2022 - Phone Call - Scr increased. Referred to nephrologist.  04/24/2022 - Int Med Inda Castle, NP) Follow up chronic conditions. BP elevated. HCTZ increased to 78m daily.  12/01/2021 - Int Med (Inda Castle NP) Follow up chronic conditions. No med changes noted. Referral sent for new ophthalmologist. 06/21/21 - PCP (Inda Castle NP) CPE. Ordered mammogram.    Recent consult visits: None in the last 6 months.    Hospital visits: 04/20/2022 - ED Visit for dizziness and dehydration. Given IV fluids and prescribed meclizine 21m3 times a day as needed.   Objective:  Lab Results  Component Value Date   CREATININE 1.67 (H) 05/07/2022   CREATININE 1.75 (H) 04/24/2022   CREATININE 1.91 (H) 04/20/2022    Lab Results  Component Value Date   HGBA1C 6.0 04/24/2022   Last diabetic Eye exam:  Lab Results  Component Value Date/Time   HMDIABEYEEXA No Retinopathy 04/11/2021 12:00 AM    Last diabetic Foot exam: No results found for: "HMDIABFOOTEX"      Component Value Date/Time   CHOL 156 05/12/2021 0944   CHOL 142 01/22/2020 1037   TRIG 65.0 05/12/2021 0944   HDL 61.40 05/12/2021 0944   HDL 56 01/22/2020 1037   CHOLHDL 3 05/12/2021 0944  VLDL 13.0 05/12/2021 0944   LDLCALC 82 05/12/2021 0944   LDLCALC 72 01/22/2020 1037       Latest Ref Rng & Units 04/20/2022    9:13 AM 05/12/2021    9:44 AM 02/26/2020    3:54 PM  Hepatic Function  Total Protein 6.5 - 8.1 g/dL 8.1  7.0  7.2   Albumin 3.5 - 5.0 g/dL 4.0  4.2    AST 15 - 41 U/L _0 ALT 0 - 44 U/L _1 Alk Phosphatase 38 - 126 U/L 69  64     Total Bilirubin 0.3 - 1.2 mg/dL 0.6  0.6  0.6   Bilirubin, Direct 0.0 - 0.2 mg/dL   0.2     Lab Results  Component Value Date/Time   TSH 0.786 12/23/2012 11:28 AM       Latest Ref Rng & Units 04/20/2022    9:13 AM 01/22/2020   10:37 AM 11/16/2018    8:13 PM  CBC  WBC 4.0 - 10.5 K/uL 10.1  9.6  8.9   Hemoglobin 12.0 - 15.0 g/dL 14.5  13.8  13.8   Hematocrit 36.0 - 46.0 % 44.1  42.1  44.6   Platelets 150 - 400 K/uL 211  211  207     No results found for: "VD25OH"  Clinical ASCVD: Yes  The 10-year ASCVD risk score (Arnett DK, et al., 2019) is: 27.1%   Values used to calculate the score:     Age: 71 years     Sex: Female     Is Non-Hispanic African American: Yes     Diabetic: Yes     Tobacco smoker: No     Systolic Blood Pressure: 751 mmHg     Is BP treated: Yes     HDL Cholesterol: 61.4 mg/dL     Total Cholesterol: 156 mg/dL      Social History   Tobacco Use  Smoking Status Never  Smokeless Tobacco Never   BP Readings from Last 3 Encounters:  05/07/22 138/72  04/24/22 (!) 150/76  04/20/22 (!) 149/96   Pulse Readings from Last 3 Encounters:  05/07/22 72  04/24/22 80  04/20/22 71   Wt Readings from Last 3 Encounters:  05/07/22 199 lb 9.6 oz (90.5 kg)  04/24/22 200 lb 6.4 oz (90.9 kg)  04/20/22 202 lb 8 oz (91.9 kg)    Assessment: Review of patient past medical history, allergies, medications, health status, including review of consultants reports, laboratory and other test data, was performed as part of comprehensive evaluation and provision of chronic care management services.   SDOH:  (Social Determinants of Health) assessments and interventions performed:  SDOH Interventions    Flowsheet Row Chronic Care Management from 07/11/2022 in [facility] [place] Management from 05/16/2022 in [place] at [place] from 03/06/2022 in [place] at [place] Management from 10/30/2021 in [place] at [place] Management from 02/07/2021 in [place] at [place] Interventions       Food Insecurity Interventions -- -- Intervention Not Indicated -- --  Housing Interventions -- -- Intervention Not Indicated -- Intervention Not Indicated  Transportation Interventions -- -- -- -- Intervention Not Indicated  Financial Strain Interventions Intervention Not Indicated Intervention Not Indicated -- Intervention Not Indicated --  Physical Activity Interventions -- Intervention Not Indicated Intervention Not Indicated -- --  Stress Interventions -- -- Intervention Not Indicated -- --  Social Connections Interventions -- -- Intervention Not Indicated -- --         CCM Care Plan  Allergies  Allergen Reactions   Lisinopril Swelling    angioedema   Benicar [Olmesartan]     hair loss, dry skin.   Latex     Swelling/peeling skin   Shellfish Allergy    Penicillins Hives   Sulfa Antibiotics Hives    Medications Reviewed Today     Reviewed by Cherre Robins, RPH-CPP (Pharmacist) on 07/11/22 at 1430  Med List Status: <None>   Medication Order Taking? Sig Documenting Provider Last Dose Status Informant  0.9 %  sodium chloride infusion [id number]   Ladene Artist, MD  Active   0.9 %  sodium chloride infusion [id number]   Ladene Artist, MD  Active   acetaminophen (TYLENOL) 325 MG tablet [id number] Yes Take 650 mg by mouth every 6 (six) hours as needed for mild pain, moderate pain or headache. [provider] Taking Active Self  albuterol (VENTOLIN HFA) 108 (90 Base) MCG/ACT inhaler [id number] Yes Inhale 2 puffs into the lungs every 6 (six) hours as needed for wheezing or shortness of breath. [name], NP Taking Active   aspirin 81 MG EC tablet [id number] Yes Take 1 tablet (81 mg total) by mouth daily. Swallow whole. [name], NP Taking Active   Brinzolamide-Brimonidine [facility]) 1-0.2 % SUSP [id number] No Place 1 drop into both eyes 2 (two) times daily.  Patient not taking: Reported on 05/16/2022    Not Taking Active   ferrous sulfate 324 MG TBEC [id number] Yes Take 324 mg by mouth daily. [provider] Taking Active   Fluticasone-Umeclidin-Vilant (TRELEGY ELLIPTA) 100-62.5-25 MCG/ACT AEPB [id number] Yes Inhale 1 puff by mouth into the lungs daily [name], NP Taking Active   hydrochlorothiazide (HYDRODIURIL) 25 MG tablet [id number] Yes Take 1 tablet (25 mg total) by mouth daily. [name], NP Taking Active   latanoprost (XALATAN) 0.005 % ophthalmic solution [id number] No Instill 1 drop in both eyes at bedtime  Patient not taking: Reported on 05/16/2022    Not Taking Active   meclizine (ANTIVERT) 25 MG tablet [id number] Yes Take 1 tablet (25 mg total) by mouth 3 (three) times daily as needed for dizziness. Deno Etienne, DO Taking Active   rosuvastatin (CRESTOR) 40 MG tablet [id number] Yes Take 1 tablet (40 mg total) by mouth daily. [name], NP Taking Active   SIMBRINZA 1-0.2 % SUSP [id number] No Instill 1 drop in both eyes twice a day  Patient not taking: Reported on 05/16/2022    Not Taking Active   vitamin C (ASCORBIC ACID) 500 MG tablet [id number] Yes Take 500 mg by mouth daily. [provider] Taking Active   Vitamin D, Cholecalciferol, 25 MCG (1000 UT) CAPS [id number] Yes Take 1 capsule by mouth daily. [provider] Taking Active   vitamin E 180 MG (400 UNITS) capsule [id number] Yes Take 400 Units by mouth daily. [provider] Taking Active             Patient Active Problem List   Diagnosis Date Noted   Chronic kidney disease, stage 3b ([place]) 04/25/2022   Dizziness 04/24/2022   Glaucoma 12/01/2021   Preventative health care 06/21/2021   Diabetes mellitus type 2, controlled, without complications ([place]) 32/54/9826   Multiple lung nodules  on CT 10/27/2015  Syncope 10/18/2015   Reactive airways dysfunction syndrome, moderate persistent, uncomplicated (HCC) 26/71/2458   Mediastinal adenopathy 10/06/2015   Reactive airway disease 10/05/2015   History of CVA (cerebrovascular accident) 10/05/2015   Contact dermatitis 10/05/2015   Vision problem 05/20/2014   Carpal tunnel syndrome 01/18/2014   Weight gain 10/27/2013   Asthma, chronic 12/11/2012   Hyperlipidemia 08/25/2012   Dysphagia 05/22/2012   Hypertension 05/14/2012    Immunization History  Administered Date(s) Administered   Fluad Quad(high Dose 65+) 10/06/2019, 10/16/2020, 11/16/2020, 10/12/2021   Influenza, High Dose Seasonal PF 07/23/2016, 08/26/2017, 10/01/2018   Influenza,inj,Quad PF,6+ Mos 10/23/2013, 09/20/2014, 07/25/2015   Moderna SARS-COV2 Booster Vaccination 10/18/2021   Moderna Sars-Covid-2 Vaccination 12/16/2019, 01/18/2020   PFIZER(Purple Top)SARS-COV-2 Vaccination 09/28/2020, 05/25/2021   Pneumococcal Conjugate-13 07/25/2015   Pneumococcal Polysaccharide-23 01/23/2017   Tdap 05/03/2012   Zoster Recombinat (Shingrix) 04/13/2020, 10/14/2020    Conditions to be addressed/monitored: CAD, HTN, HLD, DMII and Pulmonary Disease SDOH Interventions    Flowsheet Row Chronic Care Management from 07/11/2022 in [facility] [place] Management from 05/16/2022 in [place] at [place] from 03/06/2022 in [place] at [place] Management from 10/30/2021 in [place] at [place] Management from 02/07/2021 in [place] at [place] Interventions       Food Insecurity Interventions -- -- Intervention Not Indicated -- --  Housing Interventions -- -- Intervention Not Indicated -- Intervention Not Indicated  Transportation Interventions -- -- -- --  Intervention Not Indicated  Financial Strain Interventions Intervention Not Indicated Intervention Not Indicated -- Intervention Not Indicated --  Physical Activity Interventions -- Intervention Not Indicated Intervention Not Indicated -- --  Stress Interventions -- -- Intervention Not Indicated -- --  Social Connections Interventions -- -- Intervention Not Indicated -- --        Care Plan : General Pharmacy (Adult)  Updates made by Cherre Robins, RPH-CPP since 07/18/2022 12:00 AM     Problem: Medication Adherence (Wellness)   Note:   Current Barriers:  Taking OTC vitamins not on current medicaiton list  Pharmacist Clinical Goal(s):  Over the next 180 days, patient will  report all medications (prescription, herbals and OTC) she is taking  through collaboration with PharmD and provider.   Interventions: Comprehensive medication review performed; medication list updated in electronic medical record Called [facility] to verify that patient did receive the full series of Shingrix. Vaccine record updated.    Health Maintenance :  Patient Goals/Self-Care Activities Over the next 180 days, patient will:  take medications as prescribed and report any new medications to clinical pharmacist or PCP  Follow Up Plan:  clinical pharmacist follow up by phone in 6 months    Goal: Medication Adherence Maintained   This Visit's Progress: On track  Note:   Evidence-based guidance:  Develop a complete and accurate medication list including those prescribed and over-the-counter, those taken only occasionally and those not taken by mouth such as injections, inhalers, ointments or creams and drops.  Review all medications to determine if patient or caregiver knows why the medications are given and if taken as prescribed.  Complete or review a medication adherence assessment including barriers to medication adherence.  Arrange and encourage counseling and medication review by pharmacist.  Assess  barriers to medication adherence.  Assess presence of side effects; provide suggestions to manage or reduce side  effects.  Consult with provider and/or pharmacist regarding substitute medication, changing dose, simplification of regimen or safe discontinuation of some medications.  Encourage the use of medication reminders such as clock or cell phone alarm, color coding, pillboxes for am/pm and days of the week, pharmacy refill reminder, auto-refill system or mail-order option.  Assist with resources when cost is a barrier; refer to prescription assistance programs; confirm that generics are prescribed whenever possible; consider 90-day prescriptions to reduce copay cost; synchronize refills.  Provide frequent follow-up providing motivation, encouragement and support when medication nonadherence is identified.   Assisted in requesting refill for Trelegy and provided updated Rx for hydrochlorothiazide 12.92m daily for 90 days (previous prescription was for only 30 days) patient to see cardiology office again in May 2023.         Medication Assistance: None required.  Patient affirms current coverage meets needs. Received LIS for 2023. Per patient medication copays are $0    Uses pill box? No  Pt endorses 100% compliance; Reviewed refill history and patient has fill all medications on time in 2023 so far.    [facility]  [facility] [place]Phone: [phone]: [address] [place]Phone: [phone]: [phone]  Follow Up:  Patient agrees to Care Plan and Follow-up.  Plan: Telephone follow up appointment with care management team member scheduled for:  2 months; PCP 07/2022  TCherre Robins PharmD Clinical Pharmacist [facility]

## 2022-07-18 ENCOUNTER — Other Ambulatory Visit (HOSPITAL_BASED_OUTPATIENT_CLINIC_OR_DEPARTMENT_OTHER): Payer: Self-pay

## 2022-07-18 NOTE — Patient Instructions (Signed)
Ms. Febo It was a pleasure speaking with you today.  Below is a summary of your health goals and summary of our recent visit. You can also view your updated Chronic Care Management Care plan through your MyChart account.     Patient Goals/Self-Care Activities Over the next 180 days, patient will:  take medications as prescribed  target a minimum of 150 minutes of moderate intensity exercise weekly Reminded to rinse mouth / gargle after each use of Trelegy to prevent thrush.  Limit intake of sugar and foods high in carbohydrates (bread, pasta, rice and potatoes) Limit intake of sodium - recommended less than '2300mg'$  per day Call Care coordination team to scheduled future transportation for medical appointments - Cameron Proud [phone] You should get a call from [facility] - Dr Manuella Ghazi about an eye appointment. If you do not hear from them by 07/27/2022 call our office - Cherre Robins [phone] or Dr Janyth Pupa office [place]  [phone]   As always if you have any questions or concerns especially regarding medications, please feel free to contact me either at the phone number below or with a MyChart message.   Keep up the good work!  Cherre Robins, PharmD Clinical Pharmacist [place] [phone] (direct line)  [phone] (main office number)   The patient verbalized understanding of instructions, educational materials, and care plan provided today and agreed to receive a mailed copy of patient instructions, educational materials, and care plan.

## 2022-07-19 ENCOUNTER — Other Ambulatory Visit (HOSPITAL_BASED_OUTPATIENT_CLINIC_OR_DEPARTMENT_OTHER): Payer: Self-pay

## 2022-07-20 ENCOUNTER — Other Ambulatory Visit (HOSPITAL_BASED_OUTPATIENT_CLINIC_OR_DEPARTMENT_OTHER): Payer: Self-pay
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
## 2022-07-24 ENCOUNTER — Ambulatory Visit: Payer: Medicare Other | Admitting: Internal Medicine

## 2022-07-25 ENCOUNTER — Encounter: Payer: Self-pay | Admitting: Gastroenterology

## 2022-07-31 ENCOUNTER — Telehealth: Payer: Self-pay

## 2022-07-31 NOTE — Telephone Encounter (Addendum)
Telephone encounter was:  Successful.  07/31/2022 Name: Suzanne Ali MRN: [id number] DOB: 09-24-1951  Suzanne Ali is a 71 y.o. year old female who is a primary care patient of Sandford Craze, NP . The community resource team was consulted for assistance with Transportation Needs   Care guide performed the following interventions:  Patient called in about transportation . Outbound call to [facility]. Patient has 48 one-way trips / 24 round-way trips with [facility] and can call [phone] to book rides. Transportation education sheet for county transportation has been sent out along with [employer] [place] and [employer] application. CG assisted with booking rides for 9/26 with [facility], patient has been advised. [id number] ride id to clinic - pick up time 7:55am [id number] ride id to home - pick up time 9:15a   Follow Up Plan:  No further follow up planned at this time. The patient has been provided with needed resources.  [facility] Guide, Embedded Care Coordination [facility]  [place], [place] [address]  Main Phone: [phone]  E-mail: [email or web address]  Website: [email or web address]

## 2022-08-07 ENCOUNTER — Ambulatory Visit (INDEPENDENT_AMBULATORY_CARE_PROVIDER_SITE_OTHER): Payer: Medicare Other | Admitting: Family

## 2022-08-07 VITALS — BP 137/77 | HR 86 | Temp 97.7°F | Resp 16 | Wt 207.0 lb

## 2022-08-07 DIAGNOSIS — Z1231 Encounter for screening mammogram for malignant neoplasm of breast: Secondary | ICD-10-CM | POA: Diagnosis not present

## 2022-08-07 DIAGNOSIS — H409 Unspecified glaucoma: Secondary | ICD-10-CM

## 2022-08-07 DIAGNOSIS — N1832 Chronic kidney disease, stage 3b: Secondary | ICD-10-CM | POA: Diagnosis not present

## 2022-08-07 DIAGNOSIS — J45909 Unspecified asthma, uncomplicated: Secondary | ICD-10-CM | POA: Diagnosis not present

## 2022-08-07 DIAGNOSIS — E119 Type 2 diabetes mellitus without complications: Secondary | ICD-10-CM

## 2022-08-07 DIAGNOSIS — Z1211 Encounter for screening for malignant neoplasm of colon: Secondary | ICD-10-CM | POA: Diagnosis not present

## 2022-08-07 DIAGNOSIS — E782 Mixed hyperlipidemia: Secondary | ICD-10-CM | POA: Diagnosis not present

## 2022-08-07 DIAGNOSIS — D509 Iron deficiency anemia, unspecified: Secondary | ICD-10-CM

## 2022-08-07 DIAGNOSIS — I10 Essential (primary) hypertension: Secondary | ICD-10-CM

## 2022-08-07 DIAGNOSIS — Z23 Encounter for immunization: Secondary | ICD-10-CM

## 2022-08-07 LAB — COMPREHENSIVE METABOLIC PANEL
ALT: 19 U/L (ref 0–35)
AST: 27 U/L (ref 0–37)
Albumin: 4.4 g/dL (ref 3.5–5.2)
Alkaline Phosphatase: 74 U/L (ref 39–117)
BUN: 38 mg/dL — ABNORMAL HIGH (ref 6–23)
CO2: 27 mEq/L (ref 19–32)
Calcium: 9.5 mg/dL (ref 8.4–10.5)
Chloride: 102 mEq/L (ref 96–112)
Creatinine, Ser: 1.58 mg/dL — ABNORMAL HIGH (ref 0.40–1.20)
GFR: 32.53 mL/min — ABNORMAL LOW (ref >60.00)
Glucose, Bld: 91 mg/dL (ref 70–99)
Potassium: 4 mEq/L (ref 3.5–5.1)
Sodium: 138 mEq/L (ref 135–145)
Total Bilirubin: 0.4 mg/dL (ref 0.2–1.2)
Total Protein: 7.5 g/dL (ref 6.0–8.3)

## 2022-08-07 LAB — LIPID PANEL
Cholesterol: 145 mg/dL (ref 0–200)
HDL: 62.8 mg/dL (ref >39.00)
LDL Cholesterol: 73 mg/dL (ref 0–99)
NonHDL: 81.88
Total CHOL/HDL Ratio: 2
Triglycerides: 43 mg/dL (ref 0.0–149.0)
VLDL: 8.6 mg/dL (ref 0.0–40.0)

## 2022-08-07 LAB — CBC WITH DIFFERENTIAL/PLATELET
Basophils Absolute: 0.1 10*3/uL (ref 0.0–0.1)
Basophils Relative: 0.8 % (ref 0.0–3.0)
Eosinophils Absolute: 0.3 10*3/uL (ref 0.0–0.7)
Eosinophils Relative: 3.7 % (ref 0.0–5.0)
HCT: 40.4 % (ref 36.0–46.0)
Hemoglobin: 13.3 g/dL (ref 12.0–15.0)
Lymphocytes Relative: 28.9 % (ref 12.0–46.0)
Lymphs Abs: 2.3 10*3/uL (ref 0.7–4.0)
MCHC: 33 g/dL (ref 30.0–36.0)
MCV: 83.3 fl (ref 78.0–100.0)
Monocytes Absolute: 0.9 10*3/uL (ref 0.1–1.0)
Monocytes Relative: 10.7 % (ref 3.0–12.0)
Neutro Abs: 4.5 10*3/uL (ref 1.4–7.7)
Neutrophils Relative %: 55.9 % (ref 43.0–77.0)
Platelets: 186 10*3/uL (ref 150.0–400.0)
RBC: 4.85 Mil/uL (ref 3.87–5.11)
RDW: 16.6 % — ABNORMAL HIGH (ref 11.5–15.5)
WBC: 8.1 10*3/uL (ref 4.0–10.5)

## 2022-08-07 LAB — HEMOGLOBIN A1C: Hgb A1c MFr Bld: 6.2 % (ref 4.6–6.5)

## 2022-08-07 NOTE — Assessment & Plan Note (Signed)
Lab Results  Component Value Date   HGBA1C 6.0 04/24/2022   HGBA1C 6.4 05/12/2021   HGBA1C 6.1 (H) 02/26/2020   Lab Results  Component Value Date   MICROALBUR <0.7 04/18/2020   LDLCALC 82 05/12/2021   CREATININE 1.67 (H) 05/07/2022   Wt Readings from Last 3 Encounters:  08/07/22 207 lb (93.9 kg)  05/07/22 199 lb 9.6 oz (90.5 kg)  04/24/22 200 lb 6.4 oz (90.9 kg)   Encouraged exercise due to weight loss.  Obtain follow up A1C.

## 2022-08-07 NOTE — Assessment & Plan Note (Signed)
Asthma is stable.  Continues trelegy and prn albuterol.

## 2022-08-07 NOTE — Assessment & Plan Note (Signed)
Lab Results  Component Value Date   CHOL 156 05/12/2021   HDL 61.40 05/12/2021   LDLCALC 82 05/12/2021   TRIG 65.0 05/12/2021   CHOLHDL 3 05/12/2021   Last ldl was at goal. On Crestor '40mg'$ . Check follow up lipid panel.
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
## 2022-08-07 NOTE — Assessment & Plan Note (Addendum)
Lab Results  Component Value Date   CREATININE 1.67 (H) 05/07/2022   Repeat Cr.  Advised pt to avoid nsaids.

## 2022-08-07 NOTE — Assessment & Plan Note (Signed)
She continues with eye specialist at [facility] eye.

## 2022-08-07 NOTE — Assessment & Plan Note (Signed)
BP Readings from Last 3 Encounters:  08/07/22 137/77  05/07/22 138/72  04/24/22 (!) 150/76   At goal on hctz.  Continue same.

## 2022-08-08 LAB — IRON,TIBC AND FERRITIN PANEL
%SAT: 14 % (calc) — ABNORMAL LOW (ref 16–45)
Ferritin: 43 ng/mL (ref 16–288)
Iron: 47 ug/dL (ref 45–160)
TIBC: 336 mcg/dL (calc) (ref 250–450)

## 2022-08-09 ENCOUNTER — Telehealth: Payer: Self-pay | Admitting: Family

## 2022-08-09 NOTE — Telephone Encounter (Signed)
Iron level is a little low- Please be sure to take one tablet of iron every other day.    Sugar remains at goal  Her kidney function is stable but still low.  I placed a referral to the kidney doctor back in June, have they reached out to her? If not, please give her number to [place] to call to schedule.

## 2022-08-10 NOTE — Telephone Encounter (Signed)
Patient advised of results and to continue to take iron every other day.  She reports she never got a call from [place], she was at work and "unable to write the number down. She will call back to get the number for [employer]"

## 2022-08-31 DIAGNOSIS — H35373 Puckering of macula, bilateral: Secondary | ICD-10-CM | POA: Diagnosis not present

## 2022-08-31 DIAGNOSIS — H2513 Age-related nuclear cataract, bilateral: Secondary | ICD-10-CM | POA: Diagnosis not present

## 2022-08-31 DIAGNOSIS — H35033 Hypertensive retinopathy, bilateral: Secondary | ICD-10-CM | POA: Diagnosis not present

## 2022-08-31 LAB — HM DIABETES EYE EXAM

## 2022-09-12 ENCOUNTER — Other Ambulatory Visit: Payer: Self-pay

## 2022-09-12 ENCOUNTER — Other Ambulatory Visit (HOSPITAL_BASED_OUTPATIENT_CLINIC_OR_DEPARTMENT_OTHER): Payer: Self-pay

## 2022-09-12 ENCOUNTER — Telehealth: Payer: Self-pay | Admitting: Family

## 2022-09-12 DIAGNOSIS — J45909 Unspecified asthma, uncomplicated: Secondary | ICD-10-CM

## 2022-09-12 MED ORDER — TRELEGY ELLIPTA 100-62.5-25 MCG/ACT IN AEPB
INHALATION_SPRAY | RESPIRATORY_TRACT | 5 refills | Status: DC
  Filled 2022-09-12 – 2022-10-11 (×2): qty 60, 30d supply, fill #0
  Filled 2023-01-14: qty 60, 30d supply, fill #1
  Filled 2023-02-13 – 2023-04-17 (×2): qty 60, 30d supply, fill #2

## 2022-09-12 NOTE — Telephone Encounter (Signed)
Medication:   Rx #: [id number]  Fluticasone-Umeclidin-Vilant (TRELEGY ELLIPTA) 100-62.5-25 MCG/ACT AEPB [[id number]]   Has the patient contacted their pharmacy? No. (If no, request that the patient contact the pharmacy for the refill.) (If yes, when and what did the pharmacy advise?)  Preferred Pharmacy (with phone number or street name):   [facility] [address] [place], [place] [address] Phone: [phone]  Fax: [phone]   Agent: Please be advised that RX refills may take up to 3 business days. We ask that you follow-up with your pharmacy.

## 2022-09-12 NOTE — Telephone Encounter (Signed)
Rx sent 

## 2022-09-14 ENCOUNTER — Other Ambulatory Visit (HOSPITAL_BASED_OUTPATIENT_CLINIC_OR_DEPARTMENT_OTHER): Payer: Self-pay

## 2022-09-24 ENCOUNTER — Other Ambulatory Visit (HOSPITAL_BASED_OUTPATIENT_CLINIC_OR_DEPARTMENT_OTHER): Payer: Self-pay

## 2022-10-11 ENCOUNTER — Ambulatory Visit: Payer: Medicare Other | Admitting: Pharmacist

## 2022-10-11 ENCOUNTER — Other Ambulatory Visit (HOSPITAL_BASED_OUTPATIENT_CLINIC_OR_DEPARTMENT_OTHER): Payer: Self-pay

## 2022-10-11 DIAGNOSIS — I10 Essential (primary) hypertension: Secondary | ICD-10-CM

## 2022-10-11 DIAGNOSIS — E782 Mixed hyperlipidemia: Secondary | ICD-10-CM

## 2022-10-11 DIAGNOSIS — J45909 Unspecified asthma, uncomplicated: Secondary | ICD-10-CM

## 2022-10-11 DIAGNOSIS — E119 Type 2 diabetes mellitus without complications: Secondary | ICD-10-CM

## 2022-10-11 NOTE — Progress Notes (Signed)
Pharmacy Note  10/11/2022 Name: Suzanne Ali MRN: [id number] DOB: 20-Jul-1951  Subjective: Suzanne Ali is a 71 y.o. year old female who is a primary care patient of [name], NP. Clinical Pharmacist Practitioner referral was placed to assist with medication management.    Engaged with patient by telephone for follow up visit today.  Since our last phone visit patient has seen ophthalmologist Dr Manuella Ghazi. Noted retinopathy thought to be related to hypertension. He did not recommend any eye drops.  Reviewed med list with patient and updated. She reports she is past due to receive Trelegy inhaler - looks like updated Rx was sent to pharmacy 09/12/2022 but has not been filled yet.  Patient also requested hydrochlorothiazide, rosuvastatin and albuterol inhaler.   She states she uses rescue inhaler about 3 times per week.   Objective: Review of patient status, including review of consultants reports, laboratory and other test data, was performed as part of comprehensive.  Lab Results  Component Value Date   CREATININE 1.58 (H) 08/07/2022   CREATININE 1.67 (H) 05/07/2022   CREATININE 1.75 (H) 04/24/2022    Lab Results  Component Value Date   HGBA1C 6.2 08/07/2022       Component Value Date/Time   CHOL 145 08/07/2022 0841   CHOL 142 01/22/2020 1037   TRIG 43.0 08/07/2022 0841   HDL 62.80 08/07/2022 0841   HDL 56 01/22/2020 1037   CHOLHDL 2 08/07/2022 0841   VLDL 8.6 08/07/2022 0841   LDLCALC 73 08/07/2022 0841   LDLCALC 72 01/22/2020 1037    BP Readings from Last 3 Encounters:  08/07/22 137/77  05/07/22 138/72  04/24/22 (!) 150/76    Allergies  Allergen Reactions   Lisinopril Swelling    angioedema   Benicar [Olmesartan]     hair loss, dry skin.   Latex     Swelling/peeling skin   Shellfish Allergy    Penicillins Hives   Sulfa Antibiotics Hives    Medications Reviewed Today     Reviewed by Cherre Robins, RPH-CPP (Pharmacist) on 10/11/22 at  63  Med List Status: <None>   Medication Order Taking? Sig Documenting Provider Last Dose Status Informant  0.9 %  sodium chloride infusion [id number]   Ladene Artist, MD  Active   0.9 %  sodium chloride infusion [id number]   Ladene Artist, MD  Active   acetaminophen (TYLENOL) 325 MG tablet [id number] Yes Take 650 mg by mouth every 6 (six) hours as needed for mild pain, moderate pain or headache. [provider] Taking Active Self  albuterol (VENTOLIN HFA) 108 (90 Base) MCG/ACT inhaler [id number] Yes Inhale 2 puffs by mouth into the lungs every 6 (six) hours as needed for wheezing or shortness of breath. [name], NP Taking Active   aspirin 81 MG EC tablet [id number] Yes Take 1 tablet (81 mg total) by mouth daily. Swallow whole. [name], NP Taking Active   ferrous sulfate 324 MG TBEC [id number] Yes Take 324 mg by mouth every other day. [provider] Taking Active   Fluticasone-Umeclidin-Vilant (TRELEGY ELLIPTA) 100-62.5-25 MCG/ACT AEPB [id number] Yes Inhale 1 puff by mouth into the lungs daily [name], NP Taking Active   hydrochlorothiazide (HYDRODIURIL) 25 MG tablet [id number] Yes Take 1 tablet (25 mg total) by mouth daily. [name], NP Taking Active   meclizine (ANTIVERT) 25 MG tablet [id number] No Take 1 tablet (25 mg total) by mouth 3 (three) times daily as needed for dizziness.  Patient not taking:  Reported on 10/11/2022   Deno Etienne, DO Not Taking Active   rosuvastatin (CRESTOR) 40 MG tablet [id number] Yes Take 1 tablet (40 mg total) by mouth daily. [name], NP Taking Active   vitamin C (ASCORBIC ACID) 500 MG tablet [id number] Yes Take 500 mg by mouth daily. [provider] Taking Active   Vitamin D, Cholecalciferol, 25 MCG (1000 UT) CAPS [id number] Yes Take 1 capsule by mouth daily. [provider] Taking Active   vitamin E 180 MG (400 UNITS) capsule [id number] Yes Take 400 Units by mouth daily.  [provider] Taking Active             Patient Active Problem List   Diagnosis Date Noted   Chronic kidney disease, stage 3b ([place]) 04/25/2022   Dizziness 04/24/2022   Glaucoma 12/01/2021   Preventative health care 06/21/2021   Diabetes mellitus type 2, controlled, without complications ([place]) 49/70/2637   Multiple lung nodules on CT 10/27/2015   Syncope 10/18/2015   Reactive airways dysfunction syndrome, moderate persistent, uncomplicated ([place]) 85/88/5027   Mediastinal adenopathy 10/06/2015   Reactive airway disease 10/05/2015   History of CVA (cerebrovascular accident) 10/05/2015   Contact dermatitis 10/05/2015   Vision problem 05/20/2014   Carpal tunnel syndrome 01/18/2014   Weight gain 10/27/2013   Asthma, chronic 12/11/2012   Hyperlipidemia 08/25/2012   Dysphagia 05/22/2012   Hypertension 05/14/2012     Medication Assistance:  None required.  Patient affirms current coverage meets needs.   Assessment / Plan: Medication management:  Reviewed and updated medication list Reviewed refill history and adherence Assisted patient with requesting refills for Trelegy, hydrochlorothiazide, rosuvastatin and albuterol.   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  Reminded patient to get updated COVID and Tdap vaccines. She plans to get at [facility] soon.   Follow Up:  Telephone follow up appointment with care management team member scheduled for:  3 to 4 months   Cherre Robins, PharmD Clinical Pharmacist [place] [phone]

## 2022-10-12 ENCOUNTER — Telehealth: Payer: Self-pay | Admitting: Family

## 2022-10-12 ENCOUNTER — Encounter: Payer: Self-pay | Admitting: Family

## 2022-10-12 DIAGNOSIS — I10 Essential (primary) hypertension: Secondary | ICD-10-CM

## 2022-10-12 NOTE — Telephone Encounter (Signed)
Medication: albuterol (VENTOLIN HFA) 108 (90 Base) MCG/ACT inhaler  hydrochlorothiazide (HYDRODIURIL) 25 MG tablet    rosuvastatin (CRESTOR) 40 MG tablet  Has the patient contacted their pharmacy? No.   Preferred Pharmacy:     [facility] [address], [place], [place] [address] Phone: [phone]  Fax: [phone]

## 2022-10-15 ENCOUNTER — Other Ambulatory Visit (HOSPITAL_BASED_OUTPATIENT_CLINIC_OR_DEPARTMENT_OTHER): Payer: Self-pay

## 2022-10-15 MED ORDER — HYDROCHLOROTHIAZIDE 25 MG PO TABS
25.0000 mg | ORAL_TABLET | Freq: Every day | ORAL | 1 refills | Status: DC
  Filled 2022-10-17: qty 90, 90d supply, fill #0
  Filled 2023-01-14: qty 90, 90d supply, fill #1

## 2022-10-15 MED ORDER — ALBUTEROL SULFATE HFA 108 (90 BASE) MCG/ACT IN AERS
2.0000 | INHALATION_SPRAY | Freq: Four times a day (QID) | RESPIRATORY_TRACT | 2 refills | Status: DC | PRN
  Filled 2022-10-17: qty 6.7, 20d supply, fill #0
  Filled 2023-04-17: qty 6.7, 20d supply, fill #1
  Filled 2023-06-04: qty 6.7, 25d supply, fill #2

## 2022-10-15 MED ORDER — ROSUVASTATIN CALCIUM 40 MG PO TABS
40.0000 mg | ORAL_TABLET | Freq: Every day | ORAL | 1 refills | Status: DC
  Filled 2022-10-17: qty 90, 90d supply, fill #0
  Filled 2023-01-14: qty 90, 90d supply, fill #1

## 2022-10-15 NOTE — Telephone Encounter (Signed)
Refills sent

## 2022-10-16 ENCOUNTER — Other Ambulatory Visit (HOSPITAL_BASED_OUTPATIENT_CLINIC_OR_DEPARTMENT_OTHER): Payer: Self-pay

## 2022-10-17 ENCOUNTER — Other Ambulatory Visit (HOSPITAL_BASED_OUTPATIENT_CLINIC_OR_DEPARTMENT_OTHER): Payer: Self-pay

## 2022-10-19 ENCOUNTER — Other Ambulatory Visit (HOSPITAL_BASED_OUTPATIENT_CLINIC_OR_DEPARTMENT_OTHER): Payer: Self-pay

## 2022-10-31 NOTE — Progress Notes (Signed)
[facility] Quality Team Note  Name: Suzanne Ali Date of Birth: 12/06/1950 MRN: [id number] Date: 10/31/2022  [facility] Quality Team has reviewed this patient's chart, please see recommendations below:  [facility] Quality Other; (KED GAP: KIDNEY HEALTH EVALUATION. PATIENT NEEDS URINE MICROALBUMIN/CREATININE RATIO TEST COMPLETED BEFORE END OF YEAR FOR GAP CLOSURE.)

## 2022-11-21 DIAGNOSIS — I129 Hypertensive chronic kidney disease with stage 1 through stage 4 chronic kidney disease, or unspecified chronic kidney disease: Secondary | ICD-10-CM | POA: Diagnosis not present

## 2022-11-21 DIAGNOSIS — E1122 Type 2 diabetes mellitus with diabetic chronic kidney disease: Secondary | ICD-10-CM | POA: Diagnosis not present

## 2022-11-21 DIAGNOSIS — N1832 Chronic kidney disease, stage 3b: Secondary | ICD-10-CM | POA: Diagnosis not present

## 2022-11-21 DIAGNOSIS — E785 Hyperlipidemia, unspecified: Secondary | ICD-10-CM | POA: Diagnosis not present

## 2022-11-22 ENCOUNTER — Other Ambulatory Visit: Payer: Self-pay | Admitting: Nephrology

## 2022-11-22 DIAGNOSIS — N1832 Chronic kidney disease, stage 3b: Secondary | ICD-10-CM

## 2022-12-19 ENCOUNTER — Other Ambulatory Visit: Payer: Medicare Other

## 2023-01-09 ENCOUNTER — Telehealth: Payer: 59

## 2023-01-09 ENCOUNTER — Telehealth: Payer: Self-pay | Admitting: Pharmacist

## 2023-01-09 NOTE — Telephone Encounter (Signed)
Attempted outreach for follow up on medication management, hyperlipidemia and hypertension.  Unable to reach patient - LM On VM with CB# [phone]

## 2023-01-14 ENCOUNTER — Other Ambulatory Visit (HOSPITAL_BASED_OUTPATIENT_CLINIC_OR_DEPARTMENT_OTHER): Payer: Self-pay

## 2023-01-14 ENCOUNTER — Telehealth: Payer: Self-pay | Admitting: Pharmacist

## 2023-01-14 ENCOUNTER — Ambulatory Visit: Payer: 59 | Admitting: Pharmacist

## 2023-01-14 DIAGNOSIS — J45909 Unspecified asthma, uncomplicated: Secondary | ICD-10-CM

## 2023-01-14 DIAGNOSIS — E782 Mixed hyperlipidemia: Secondary | ICD-10-CM

## 2023-01-14 DIAGNOSIS — I10 Essential (primary) hypertension: Secondary | ICD-10-CM

## 2023-01-14 NOTE — Telephone Encounter (Signed)
Second outreach by Clinical Pharmacist Practitioner for hypertension and medication management follow up.  Unable to reach patient. LM on VM with CB# [phone] or [phone]  I was able to reach patient's daughter to discuss meds. See telephone visit note.

## 2023-01-14 NOTE — Progress Notes (Signed)
Pharmacy Note  01/14/2023 Name: [name] MRN: [id number] DOB: [birth year]  Subjective: Suzanne Ali is a 72 y.o. year old female who is a primary care patient of [name], NP. Clinical Pharmacist Practitioner referral was placed to assist with medication management.    Engaged with patient's daughter Suzanne Ali by telephone for follow up visit today.  Hypertension:  Current therapy - hydrochlorothiazide '25mg'$  daily.  Patient blood pressure has improved with improved med adherence and assistance from her children with medication adminitration.  Checks blood pressure at home sometimes but daughter was not sure how often.   Asthma:  Current therapy - Trelegy daily and albuterol as needed.  Patient has not filled Trelelgy since December - got 30 day supply.  Has not requested albuterol recently.   Hyperlipidemia:  Current therapy - rosuvastatin '40mg'$  daily   Objective: Review of patient status, including review of consultants reports, laboratory and other test data, was performed as part of comprehensive.  Lab Results  Component Value Date   CREATININE 1.58 (H) 08/07/2022   CREATININE 1.67 (H) 05/07/2022   CREATININE 1.75 (H) 04/24/2022    Lab Results  Component Value Date   HGBA1C 6.2 08/07/2022       Component Value Date/Time   CHOL 145 08/07/2022 0841   CHOL 142 01/22/2020 1037   TRIG 43.0 08/07/2022 0841   HDL 62.80 08/07/2022 0841   HDL 56 01/22/2020 1037   CHOLHDL 2 08/07/2022 0841   VLDL 8.6 08/07/2022 0841   LDLCALC 73 08/07/2022 0841   LDLCALC 72 01/22/2020 1037    BP Readings from Last 3 Encounters:  08/07/22 137/77  05/07/22 138/72  04/24/22 (!) 150/76    Allergies  Allergen Reactions   Lisinopril Swelling    angioedema   Benicar [Olmesartan]     hair loss, dry skin.   Latex     Swelling/peeling skin   Shellfish Allergy    Penicillins Hives   Sulfa Antibiotics Hives    Medications Reviewed Today     Reviewed by  Cherre Robins, RPH-CPP (Pharmacist) on 10/11/22 at 76  Med List Status: <None>   Medication Order Taking? Sig Documenting Provider Last Dose Status Informant  0.9 %  sodium chloride infusion [id number]   Ladene Artist, MD  Active   0.9 %  sodium chloride infusion [id number]   Ladene Artist, MD  Active   acetaminophen (TYLENOL) 325 MG tablet [id number] Yes Take 650 mg by mouth every 6 (six) hours as needed for mild pain, moderate pain or headache. [provider] Taking Active Self  albuterol (VENTOLIN HFA) 108 (90 Base) MCG/ACT inhaler [id number] Yes Inhale 2 puffs by mouth into the lungs every 6 (six) hours as needed for wheezing or shortness of breath. [name], NP Taking Active   aspirin 81 MG EC tablet [id number] Yes Take 1 tablet (81 mg total) by mouth daily. Swallow whole. [name], NP Taking Active   ferrous sulfate 324 MG TBEC [id number] Yes Take 324 mg by mouth every other day. [provider] Taking Active   Fluticasone-Umeclidin-Vilant (TRELEGY ELLIPTA) 100-62.5-25 MCG/ACT AEPB [id number] Yes Inhale 1 puff by mouth into the lungs daily [name], NP Taking Active   hydrochlorothiazide (HYDRODIURIL) 25 MG tablet [id number] Yes Take 1 tablet (25 mg total) by mouth daily. [name], NP Taking Active   meclizine (ANTIVERT) 25 MG tablet [id number] No Take 1 tablet (25 mg total) by mouth 3 (three) times daily as needed for dizziness.  Patient not taking: Reported on 10/11/2022   Deno Etienne, DO Not Taking Active   rosuvastatin (CRESTOR) 40 MG tablet [id number] Yes Take 1 tablet (40 mg total) by mouth daily. [name], NP Taking Active   vitamin C (ASCORBIC ACID) 500 MG tablet [id number] Yes Take 500 mg by mouth daily. [provider] Taking Active   Vitamin D, Cholecalciferol, 25 MCG (1000 UT) CAPS [id number] Yes Take 1 capsule by mouth daily. [provider] Taking Active   vitamin E 180 MG (400 UNITS) capsule  [id number] Yes Take 400 Units by mouth daily. [provider] Taking Active             Patient Active Problem List   Diagnosis Date Noted   Chronic kidney disease, stage 3b ([place]) 04/25/2022   Dizziness 04/24/2022   Glaucoma 12/01/2021   Preventative health care 06/21/2021   Diabetes mellitus type 2, controlled, without complications ([place]) [id number]   Multiple lung nodules on CT 10/27/2015   Syncope 10/18/2015   Reactive airways dysfunction syndrome, moderate persistent, uncomplicated ([place]) [id number]   Mediastinal adenopathy 10/06/2015   Reactive airway disease 10/05/2015   History of CVA (cerebrovascular accident) 10/05/2015   Contact dermatitis 10/05/2015   Vision problem 05/20/2014   Carpal tunnel syndrome 01/18/2014   Weight gain 10/27/2013   Asthma, chronic 12/11/2012   Hyperlipidemia 08/25/2012   Dysphagia 05/22/2012   Hypertension 05/14/2012     Medication Assistance:  None required.  Patient affirms current coverage meets needs.   Assessment / Plan: Hypertension: at goal the last 2 office visits.  Continue hydrochlorothiazide daily Continue to check blood pressure 2 to 3 times per week Reviewed blood pressure goal < 140/90  Asthma:  Discussed importance of using Trelegy daily to prevent exacerbations and hospitalizations Continue to use albuterol if needed.   Hyperlipidemia:  Last LDL was < 100 Continue rosuvastatin '40mg'$  daily Due to have labs recheck in 3 to 6 months.   Medication management:  Reviewed and updated medication list Reviewed refill history and adherence (will follow adherence for Trelegy)  Coordinated with pharmacy for needed refills. Her pharmacy will put her on auto refill program going forward  Follow Up:  Telephone follow up appointment with care management team member scheduled for:   4 to 6 weeks (to check Trelegy adherence)   Cherre Robins, PharmD Clinical Pharmacist [facility] [phone]

## 2023-01-21 ENCOUNTER — Other Ambulatory Visit (HOSPITAL_BASED_OUTPATIENT_CLINIC_OR_DEPARTMENT_OTHER): Payer: Self-pay

## 2023-02-21 ENCOUNTER — Other Ambulatory Visit (HOSPITAL_BASED_OUTPATIENT_CLINIC_OR_DEPARTMENT_OTHER): Payer: Self-pay

## 2023-02-25 ENCOUNTER — Ambulatory Visit (INDEPENDENT_AMBULATORY_CARE_PROVIDER_SITE_OTHER): Payer: 59 | Admitting: Pharmacist

## 2023-02-25 DIAGNOSIS — E782 Mixed hyperlipidemia: Secondary | ICD-10-CM

## 2023-02-25 DIAGNOSIS — J45909 Unspecified asthma, uncomplicated: Secondary | ICD-10-CM

## 2023-02-25 DIAGNOSIS — I10 Essential (primary) hypertension: Secondary | ICD-10-CM

## 2023-02-25 NOTE — Progress Notes (Signed)
Pharmacy Note  02/25/2023 Name: Suzanne Ali MRN: [id number] DOB: 1951-08-31  Subjective: Suzanne Ali is a 72 y.o. year old female who is a primary care patient of Sandford Craze, NP. Clinical Pharmacist Practitioner referral was placed to assist with medication management.    Engaged with patient by telephone for follow up visit today.  Hypertension:  Current therapy - hydrochlorothiazide 25mg  daily.  Patient blood pressure has improved with improved med adherence and assistance from her children with medication adminitration.  Home blood pressure has been 120's / 80 to 90 - checking 2 or 3 times per week.  Exercise - none currently but last year was exercising daily Diet - not following any particular diet.  Asthma:  Current therapy - Trelegy daily and albuterol as needed.  Maintenance inhaler use - not using every day. Patient states she is trying to back off using Trelegy.  Rescue inhaler use - less than once per week  Hyperlipidemia:  Current therapy - rosuvastatin 40mg  daily   Objective: Review of patient status, including review of consultants reports, laboratory and other test data, was performed as part of comprehensive.  Lab Results  Component Value Date   CREATININE 1.58 (H) 08/07/2022   CREATININE 1.67 (H) 05/07/2022   CREATININE 1.75 (H) 04/24/2022    Lab Results  Component Value Date   HGBA1C 6.2 08/07/2022       Component Value Date/Time   CHOL 145 08/07/2022 0841   CHOL 142 01/22/2020 1037   TRIG 43.0 08/07/2022 0841   HDL 62.80 08/07/2022 0841   HDL 56 01/22/2020 1037   CHOLHDL 2 08/07/2022 0841   VLDL 8.6 08/07/2022 0841   LDLCALC 73 08/07/2022 0841   LDLCALC 72 01/22/2020 1037    BP Readings from Last 3 Encounters:  08/07/22 137/77  05/07/22 138/72  04/24/22 (!) 150/76    Allergies  Allergen Reactions   Lisinopril Swelling    angioedema   Benicar [Olmesartan]     hair loss, dry skin.   Latex     Swelling/peeling skin    Shellfish Allergy    Penicillins Hives   Sulfa Antibiotics Hives    Medications Reviewed Today     Reviewed by Henrene Pastor, RPH-CPP (Pharmacist) on 02/25/23 at 1528  Med List Status: <None>   Medication Order Taking? Sig Documenting Provider Last Dose Status Informant  0.9 %  sodium chloride infusion [id number]   Meryl Dare, MD  Active   0.9 %  sodium chloride infusion [id number]   Meryl Dare, MD  Active   acetaminophen (TYLENOL) 325 MG tablet [id number] Yes Take 650 mg by mouth every 6 (six) hours as needed for mild pain, moderate pain or headache. [provider] Taking Active Self  albuterol (VENTOLIN HFA) 108 (90 Base) MCG/ACT inhaler [id number] Yes Inhale 2 puffs by mouth into the lungs every 6 (six) hours as needed for wheezing or shortness of breath. Sandford Craze, NP Taking Active   aspirin 81 MG EC tablet [id number] Yes Take 1 tablet (81 mg total) by mouth daily. Swallow whole. Sandford Craze, NP Taking Active   ferrous sulfate 324 MG TBEC [id number] Yes Take 324 mg by mouth every other day. [provider] Taking Active   Fluticasone-Umeclidin-Vilant (TRELEGY ELLIPTA) 100-62.5-25 MCG/ACT AEPB [id number] Yes Inhale 1 puff by mouth into the lungs daily Sandford Craze, NP Taking Active   hydrochlorothiazide (HYDRODIURIL) 25 MG tablet [id number] Yes Take 1 tablet (25 mg total) by mouth daily. Sandford Craze, NP  Taking Active   meclizine (ANTIVERT) 25 MG tablet [id number]  Take 1 tablet (25 mg total) by mouth 3 (three) times daily as needed for dizziness. Melene Plan, DO  Active   rosuvastatin (CRESTOR) 40 MG tablet [id number] Yes Take 1 tablet (40 mg total) by mouth daily. Sandford Craze, NP Taking Active   vitamin C (ASCORBIC ACID) 500 MG tablet [id number] Yes Take 500 mg by mouth daily. [provider] Taking Active   Vitamin D, Cholecalciferol, 25 MCG (1000 UT) CAPS [id number] Yes Take 1 capsule by mouth daily. [provider] Taking Active   vitamin E 180 MG (400 UNITS) capsule [id number] Yes Take 400 Units by mouth daily. [provider] Taking Active             Patient Active Problem List   Diagnosis Date Noted   Chronic kidney disease, stage 3b 04/25/2022   Dizziness 04/24/2022   Glaucoma 12/01/2021   Preventative health care 06/21/2021   Diabetes mellitus type 2, controlled, without complications 07/23/2016   Multiple lung nodules on CT 10/27/2015   Syncope 10/18/2015   Reactive airways dysfunction syndrome, moderate persistent, uncomplicated ([facility]) 10/06/2015   Mediastinal adenopathy 10/06/2015   Reactive airway disease 10/05/2015   History of CVA (cerebrovascular accident) 10/05/2015   Contact dermatitis 10/05/2015   Vision problem 05/20/2014   Carpal tunnel syndrome 01/18/2014   Weight gain 10/27/2013   Asthma, chronic 12/11/2012   Hyperlipidemia 08/25/2012   Dysphagia 05/22/2012   Hypertension 05/14/2012     Medication Assistance:  None required.  Patient affirms current coverage meets needs.   Assessment / Plan: Hypertension: at goal < 140/90 Continue hydrochlorothiazide daily Continue to check blood pressure 2 to 3 times per week Reviewed blood pressure goal < 140/90  Asthma:  Discussed importance of using Trelegy daily to prevent exacerbations and hospitalizations.  Discussed inhaler technique and recommended she rinse mouth after each use.  Continue to use albuterol if needed. Education provided on rescue versus maintenance inhaler.   Hyperlipidemia:  Last LDL was < 100 Continue rosuvastatin  daily Due to have labs recheck in 3 to 6 months.   Medication management:  Reviewed and updated medication list Reviewed refill history and adherence (will follow adherence for Trelegy - reminded patient to use every day)   Follow Up:  Telephone follow up appointment with care management team member scheduled for:  1 to 2 months to check medication  adherence.    Henrene Pastor, PharmD Clinical Pharmacist [facility]  - [id number] [facility] [phone]

## 2023-03-29 DIAGNOSIS — I129 Hypertensive chronic kidney disease with stage 1 through stage 4 chronic kidney disease, or unspecified chronic kidney disease: Secondary | ICD-10-CM | POA: Diagnosis not present

## 2023-03-29 DIAGNOSIS — E785 Hyperlipidemia, unspecified: Secondary | ICD-10-CM | POA: Diagnosis not present

## 2023-03-29 DIAGNOSIS — E1122 Type 2 diabetes mellitus with diabetic chronic kidney disease: Secondary | ICD-10-CM | POA: Diagnosis not present

## 2023-03-29 DIAGNOSIS — N1832 Chronic kidney disease, stage 3b: Secondary | ICD-10-CM | POA: Diagnosis not present

## 2023-03-30 LAB — LAB REPORT - SCANNED: EGFR: 23

## 2023-04-02 ENCOUNTER — Other Ambulatory Visit: Payer: Self-pay | Admitting: Nephrology

## 2023-04-02 DIAGNOSIS — N1832 Chronic kidney disease, stage 3b: Secondary | ICD-10-CM

## 2023-04-10 ENCOUNTER — Ambulatory Visit
Admission: RE | Admit: 2023-04-10 | Discharge: 2023-04-10 | Disposition: A | Discharge status: Home / Self Care | Payer: 59 | Source: Ambulatory Visit | Attending: Nephrology | Admitting: Nephrology

## 2023-04-10 DIAGNOSIS — N1832 Chronic kidney disease, stage 3b: Secondary | ICD-10-CM

## 2023-04-10 DIAGNOSIS — N2889 Other specified disorders of kidney and ureter: Secondary | ICD-10-CM | POA: Diagnosis not present

## 2023-04-10 DIAGNOSIS — N189 Chronic kidney disease, unspecified: Secondary | ICD-10-CM | POA: Diagnosis not present

## 2023-04-10 DIAGNOSIS — N281 Cyst of kidney, acquired: Secondary | ICD-10-CM | POA: Diagnosis not present

## 2023-04-15 ENCOUNTER — Other Ambulatory Visit: Payer: Self-pay | Admitting: Family

## 2023-04-15 DIAGNOSIS — I10 Essential (primary) hypertension: Secondary | ICD-10-CM

## 2023-04-16 ENCOUNTER — Other Ambulatory Visit (HOSPITAL_BASED_OUTPATIENT_CLINIC_OR_DEPARTMENT_OTHER): Payer: Self-pay

## 2023-04-16 MED ORDER — HYDROCHLOROTHIAZIDE 25 MG PO TABS
25.0000 mg | ORAL_TABLET | Freq: Every day | ORAL | 0 refills | Status: DC
Start: 2023-04-16 — End: 2023-07-22
  Filled 2023-04-16: qty 90, 90d supply, fill #0

## 2023-04-16 MED ORDER — ROSUVASTATIN CALCIUM 40 MG PO TABS
40.0000 mg | ORAL_TABLET | Freq: Every day | ORAL | 0 refills | Status: DC
  Filled 2023-04-16: qty 90, 90d supply, fill #0

## 2023-04-17 ENCOUNTER — Other Ambulatory Visit (HOSPITAL_BASED_OUTPATIENT_CLINIC_OR_DEPARTMENT_OTHER): Payer: Self-pay

## 2023-04-17 ENCOUNTER — Ambulatory Visit (INDEPENDENT_AMBULATORY_CARE_PROVIDER_SITE_OTHER): Payer: Self-pay | Admitting: Pharmacist

## 2023-04-17 DIAGNOSIS — I10 Essential (primary) hypertension: Secondary | ICD-10-CM

## 2023-04-17 DIAGNOSIS — J45909 Unspecified asthma, uncomplicated: Secondary | ICD-10-CM

## 2023-04-17 DIAGNOSIS — E782 Mixed hyperlipidemia: Secondary | ICD-10-CM

## 2023-04-17 NOTE — Progress Notes (Signed)
Pharmacy Note  04/17/2023 Name: Suzanne Ali MRN: [id number] DOB: 06/01/1951  Subjective: Suzanne Ali is a 72 y.o. year old female who is a primary care patient of Sandford Craze, NP. Clinical Pharmacist Practitioner referral was placed to assist with medication management.    Engaged with patient by telephone for follow up visit today.  Hypertension:  Current therapy - hydrochlorothiazide 25mg  daily.  Patient blood pressure has improved with improved med adherence  Home blood pressure has been 120's / 80's checking 2 or 3 times per week.  Patient denies dizziness or chest pain. No blurry vision.  Exercise - none currently  Diet - not following any particular diet.  Asthma:  Current therapy - Trelegy daily and albuterol as needed.  Maintenance inhaler use - not using every day. Patient states she is trying to back off using Trelegy.  Patient mentions that sometimes she gets power in her mouth when using Trelegy.  Rescue inhaler use - less than once per week  Hyperlipidemia:  Current therapy - rosuvastatin 40mg  daily  Patient states she has been having more gas lately. BMs are not regular but denies hard stool or diarrhea. She is taking iron supplement every other day which can cause constipation and gas.    Objective: Review of patient status, including review of consultants reports, laboratory and other test data, was performed as part of comprehensive.  Lab Results  Component Value Date   CREATININE 1.58 (H) 08/07/2022   CREATININE 1.67 (H) 05/07/2022   CREATININE 1.75 (H) 04/24/2022    Lab Results  Component Value Date   HGBA1C 6.2 08/07/2022       Component Value Date/Time   CHOL 145 08/07/2022 0841   CHOL 142 01/22/2020 1037   TRIG 43.0 08/07/2022 0841   HDL 62.80 08/07/2022 0841   HDL 56 01/22/2020 1037   CHOLHDL 2 08/07/2022 0841   VLDL 8.6 08/07/2022 0841   LDLCALC 73 08/07/2022 0841   LDLCALC 72 01/22/2020 1037    BP Readings from Last  3 Encounters:  08/07/22 137/77  05/07/22 138/72  04/24/22 (!) 150/76    Allergies  Allergen Reactions   Lisinopril Swelling    angioedema   Benicar [Olmesartan]     hair loss, dry skin.   Latex     Swelling/peeling skin   Shellfish Allergy    Penicillins Hives   Sulfa Antibiotics Hives    Medications Reviewed Today     Reviewed by Juel Burrow, CMA (Certified Medical Assistant) on 03/12/23 at 1542  Med List Status: <None>   Medication Order Taking? Sig Documenting Provider Last Dose Status Informant  0.9 %  sodium chloride infusion [id number]   Meryl Dare, MD  Active   0.9 %  sodium chloride infusion [id number]   Meryl Dare, MD  Active   acetaminophen (TYLENOL) 325 MG tablet [id number] No Take 650 mg by mouth every 6 (six) hours as needed for mild pain, moderate pain or headache. [provider] Taking Active Self  albuterol (VENTOLIN HFA) 108 (90 Base) MCG/ACT inhaler [id number] No Inhale 2 puffs by mouth into the lungs every 6 (six) hours as needed for wheezing or shortness of breath. Sandford Craze, NP Taking Active   aspirin 81 MG EC tablet [id number] No Take 1 tablet (81 mg total) by mouth daily. Swallow whole. Sandford Craze, NP Taking Active   ferrous sulfate 324 MG TBEC [id number] No Take 324 mg by mouth every other day. [provider] Taking  Active   Fluticasone-Umeclidin-Vilant (TRELEGY ELLIPTA) 100-62.5-25 MCG/ACT AEPB [id number] No Inhale 1 puff by mouth into the lungs daily Sandford Craze, NP Taking Active   hydrochlorothiazide (HYDRODIURIL) 25 MG tablet [id number] No Take 1 tablet (25 mg total) by mouth daily. Sandford Craze, NP Taking Active   meclizine (ANTIVERT) 25 MG tablet [id number] No Take 1 tablet (25 mg total) by mouth 3 (three) times daily as needed for dizziness. Melene Plan, DO Taking Active   rosuvastatin (CRESTOR) 40 MG tablet [id number] No Take 1 tablet (40 mg total) by mouth daily. Sandford Craze, NP Taking  Active   vitamin C (ASCORBIC ACID) 500 MG tablet [id number] No Take 500 mg by mouth daily. [provider] Taking Active   Vitamin D, Cholecalciferol, 25 MCG (1000 UT) CAPS [id number] No Take 1 capsule by mouth daily. [provider] Taking Active   vitamin E 180 MG (400 UNITS) capsule [id number] No Take 400 Units by mouth daily. [provider] Taking Active             Patient Active Problem List   Diagnosis Date Noted   Chronic kidney disease, stage 3b ([facility]) 04/25/2022   Dizziness 04/24/2022   Glaucoma 12/01/2021   Preventative health care 06/21/2021   Diabetes mellitus type 2, controlled, without complications ([facility]) 07/23/2016   Multiple lung nodules on CT 10/27/2015   Syncope 10/18/2015   Reactive airways dysfunction syndrome, moderate persistent, uncomplicated ([facility]) 10/06/2015   Mediastinal adenopathy 10/06/2015   Reactive airway disease 10/05/2015   History of CVA (cerebrovascular accident) 10/05/2015   Contact dermatitis 10/05/2015   Vision problem 05/20/2014   Carpal tunnel syndrome 01/18/2014   Weight gain 10/27/2013   Asthma, chronic 12/11/2012   Hyperlipidemia 08/25/2012   Dysphagia 05/22/2012   Hypertension 05/14/2012     Medication Assistance:  None required.  Patient affirms current coverage meets needs.   Assessment / Plan: Hypertension: at goal < 140/90 Continue hydrochlorothiazide daily Continue to check blood pressure 2 to 3 times per week Reviewed blood pressure goal of  < 140/90 with patient   Asthma:  Discussed importance of using Trelegy daily to prevent exacerbations and hospitalizations. Reviewed inhaler technique with patient. Reminded patient to inhale quickly and deeply when she uses Trelegy. If she continues to feel that she is not getting full dose or that there is residue in her mouth after using Trelegy might consider switching to [facility] MDI. Reminded patient to rinse mouth after each use of Trelegy Continue to use  albuterol if needed. Education provided on rescue versus maintenance inhaler.   Hyperlipidemia:  Last LDL was < 100 Continue rosuvastatin 40mg  daily Due to have labs recheck   Medication management:  Reviewed and updated medication list Reviewed refill history and adherence (will follow adherence for Trelegy - reminded patient to use every day)  Assisted patient in requesting refills for albuterol and Trelegy from pharmacy.   Made follow up appointment with PCP for May 06, 2024.   Gas / Constipation:  Recommended she increase daily water intake - drink water with meals and try to sip water between meals.  Can use stool softner like Colace 100mg  daily or try Miralax - 1 scoop once a day if needed.  If not improved patient to contact office.   Follow Up:  Telephone follow up appointment with care management team member scheduled for:  1 to 2 months to check medication adherence.    Henrene Pastor, PharmD Clinical Pharmacist [facility]  - [facility]  [place] [phone]

## 2023-04-17 NOTE — Patient Instructions (Signed)
  Continue hydrochlorothiazide daily Continue to check blood pressure 2 to 3 times per week Reviewed blood pressure goal of  < 140/90  Asthma:  Important to use Trelegy daily to prevent exacerbations and hospitalizations. Remember to inhale Trelegy quickly and deeply when she uses Trelegy.  Rinse mouth after each use of Trelegy Continue to use albuterol if needed.   Cholesterol Last LDL was < 100 Continue rosuvastatin 40mg  daily Due to have labs recheck   Gas / Constipation:  Increase daily water intake - drink water with meals and try to sip water between meals.  Can use stool softner like Colace 100mg  daily or try Miralax - 1 scoop once a day if needed.  If not improved contact office.

## 2023-04-18 ENCOUNTER — Other Ambulatory Visit: Payer: Self-pay

## 2023-04-29 ENCOUNTER — Other Ambulatory Visit (HOSPITAL_BASED_OUTPATIENT_CLINIC_OR_DEPARTMENT_OTHER): Payer: Self-pay

## 2023-05-01 ENCOUNTER — Telehealth: Payer: Self-pay | Admitting: Family

## 2023-05-01 NOTE — Telephone Encounter (Signed)
Pt called stating that she was supposed to be seeing a [facility] nurse today but hadn't seen them. Pt stated that she did not have contact info for the [facility] agency. After reviewing chart, was unable to locate [facility] agency contact info and advised a message would be sent back to look into this.

## 2023-05-03 NOTE — Telephone Encounter (Signed)
Patient advised we do not have any documentation about her getting home health and provider has not seen her since last year. She reports her son told her about the visit, she will try to clarify with him

## 2023-05-08 ENCOUNTER — Ambulatory Visit: Payer: 59 | Admitting: Family

## 2023-06-04 ENCOUNTER — Other Ambulatory Visit (HOSPITAL_BASED_OUTPATIENT_CLINIC_OR_DEPARTMENT_OTHER): Payer: Self-pay

## 2023-07-05 ENCOUNTER — Telehealth: Payer: Self-pay | Admitting: Family

## 2023-07-05 NOTE — Telephone Encounter (Signed)
Copied from CRM [id number]. Topic: Medicare AWV >> Jul 05, 2023 10:40 AM Payton Doughty wrote: Reason for CRM: LM 07/05/2023 to schedule AWV   Verlee Rossetti; Care Guide Ambulatory Clinical Support [place] [facility] Direct Dial: [phone]

## 2023-07-22 ENCOUNTER — Other Ambulatory Visit (HOSPITAL_BASED_OUTPATIENT_CLINIC_OR_DEPARTMENT_OTHER): Payer: Self-pay

## 2023-07-22 ENCOUNTER — Other Ambulatory Visit: Payer: Self-pay | Admitting: Family

## 2023-07-22 DIAGNOSIS — I10 Essential (primary) hypertension: Secondary | ICD-10-CM

## 2023-07-22 MED ORDER — HYDROCHLOROTHIAZIDE 25 MG PO TABS
25.0000 mg | ORAL_TABLET | Freq: Every day | ORAL | 0 refills | Status: DC
Start: 2023-07-22 — End: 2023-08-20
  Filled 2023-07-22 – 2023-08-14 (×3): qty 90, 90d supply, fill #0

## 2023-07-22 MED ORDER — ROSUVASTATIN CALCIUM 40 MG PO TABS
40.0000 mg | ORAL_TABLET | Freq: Every day | ORAL | 0 refills | Status: DC
  Filled 2023-07-22 – 2023-08-14 (×3): qty 90, 90d supply, fill #0

## 2023-08-01 ENCOUNTER — Other Ambulatory Visit (HOSPITAL_BASED_OUTPATIENT_CLINIC_OR_DEPARTMENT_OTHER): Payer: Self-pay

## 2023-08-02 ENCOUNTER — Other Ambulatory Visit (HOSPITAL_BASED_OUTPATIENT_CLINIC_OR_DEPARTMENT_OTHER): Payer: Self-pay

## 2023-08-13 ENCOUNTER — Other Ambulatory Visit (HOSPITAL_BASED_OUTPATIENT_CLINIC_OR_DEPARTMENT_OTHER): Payer: Self-pay

## 2023-08-14 ENCOUNTER — Other Ambulatory Visit (HOSPITAL_BASED_OUTPATIENT_CLINIC_OR_DEPARTMENT_OTHER): Payer: Self-pay

## 2023-08-19 ENCOUNTER — Telehealth: Payer: Self-pay

## 2023-08-19 NOTE — Telephone Encounter (Signed)
Caller Name Lani Jami Bogdanski Phone Number [phone] Patient Name Suzanne Ali Patient DOB 03/10/1961 Call Type Message Only Information Provided Reason for Call Request to Schedule Office Appointment Initial Comment Caller states she would like an appt. Disp. Time Disposition Final User 08/18/2023 8:54:10 AM General Information Provided Yes Dennison Mascot Call Closed By: Dennison Mascot Transaction Date/Time: 08/18/2023 8:51:28 AM (ET)

## 2023-08-19 NOTE — Telephone Encounter (Signed)
Patient scheduled to come in tomorrow  

## 2023-08-20 ENCOUNTER — Telehealth: Payer: Self-pay | Admitting: Family

## 2023-08-20 ENCOUNTER — Other Ambulatory Visit: Payer: Self-pay

## 2023-08-20 ENCOUNTER — Encounter: Payer: Self-pay | Admitting: Family

## 2023-08-20 ENCOUNTER — Ambulatory Visit (INDEPENDENT_AMBULATORY_CARE_PROVIDER_SITE_OTHER): Payer: Medicare HMO | Admitting: Family

## 2023-08-20 ENCOUNTER — Other Ambulatory Visit (HOSPITAL_BASED_OUTPATIENT_CLINIC_OR_DEPARTMENT_OTHER): Payer: Self-pay

## 2023-08-20 VITALS — BP 120/74 | HR 86 | Temp 97.6°F | Resp 18 | Ht 63.0 in | Wt 202.6 lb

## 2023-08-20 DIAGNOSIS — J4541 Moderate persistent asthma with (acute) exacerbation: Secondary | ICD-10-CM

## 2023-08-20 DIAGNOSIS — N1832 Chronic kidney disease, stage 3b: Secondary | ICD-10-CM

## 2023-08-20 DIAGNOSIS — Z8673 Personal history of transient ischemic attack (TIA), and cerebral infarction without residual deficits: Secondary | ICD-10-CM

## 2023-08-20 DIAGNOSIS — I10 Essential (primary) hypertension: Secondary | ICD-10-CM

## 2023-08-20 DIAGNOSIS — D509 Iron deficiency anemia, unspecified: Secondary | ICD-10-CM

## 2023-08-20 DIAGNOSIS — E119 Type 2 diabetes mellitus without complications: Secondary | ICD-10-CM

## 2023-08-20 DIAGNOSIS — E782 Mixed hyperlipidemia: Secondary | ICD-10-CM | POA: Diagnosis not present

## 2023-08-20 DIAGNOSIS — R918 Other nonspecific abnormal finding of lung field: Secondary | ICD-10-CM

## 2023-08-20 DIAGNOSIS — Z23 Encounter for immunization: Secondary | ICD-10-CM | POA: Diagnosis not present

## 2023-08-20 DIAGNOSIS — J45909 Unspecified asthma, uncomplicated: Secondary | ICD-10-CM | POA: Diagnosis not present

## 2023-08-20 DIAGNOSIS — Z1231 Encounter for screening mammogram for malignant neoplasm of breast: Secondary | ICD-10-CM

## 2023-08-20 DIAGNOSIS — R635 Abnormal weight gain: Secondary | ICD-10-CM

## 2023-08-20 LAB — LIPID PANEL
Cholesterol: 186 mg/dL (ref 0–200)
HDL: 64.2 mg/dL (ref >39.00)
LDL Cholesterol: 106 mg/dL — ABNORMAL HIGH (ref 0–99)
NonHDL: 121.51
Total CHOL/HDL Ratio: 3
Triglycerides: 76 mg/dL (ref 0.0–149.0)
VLDL: 15.2 mg/dL (ref 0.0–40.0)

## 2023-08-20 LAB — CBC WITH DIFFERENTIAL/PLATELET
Basophils Absolute: 0 10*3/uL (ref 0.0–0.1)
Basophils Relative: 0.7 % (ref 0.0–3.0)
Eosinophils Absolute: 0.3 10*3/uL (ref 0.0–0.7)
Eosinophils Relative: 4.7 % (ref 0.0–5.0)
HCT: 42.6 % (ref 36.0–46.0)
Hemoglobin: 13.5 g/dL (ref 12.0–15.0)
Lymphocytes Relative: 25.7 % (ref 12.0–46.0)
Lymphs Abs: 1.8 10*3/uL (ref 0.7–4.0)
MCHC: 31.6 g/dL (ref 30.0–36.0)
MCV: 86.5 fL (ref 78.0–100.0)
Monocytes Absolute: 0.5 10*3/uL (ref 0.1–1.0)
Monocytes Relative: 7.8 % (ref 3.0–12.0)
Neutro Abs: 4.2 10*3/uL (ref 1.4–7.7)
Neutrophils Relative %: 61.1 % (ref 43.0–77.0)
Platelets: 171 10*3/uL (ref 150.0–400.0)
RBC: 4.92 Mil/uL (ref 3.87–5.11)
RDW: 17.1 % — ABNORMAL HIGH (ref 11.5–15.5)
WBC: 6.9 10*3/uL (ref 4.0–10.5)

## 2023-08-20 LAB — COMPREHENSIVE METABOLIC PANEL
ALT: 12 U/L (ref 0–35)
AST: 18 U/L (ref 0–37)
Albumin: 4.5 g/dL (ref 3.5–5.2)
Alkaline Phosphatase: 59 U/L (ref 39–117)
BUN: 38 mg/dL — ABNORMAL HIGH (ref 6–23)
CO2: 24 meq/L (ref 19–32)
Calcium: 9.7 mg/dL (ref 8.4–10.5)
Chloride: 105 meq/L (ref 96–112)
Creatinine, Ser: 1.43 mg/dL — ABNORMAL HIGH (ref 0.40–1.20)
GFR: 36.4 mL/min — ABNORMAL LOW (ref >60.00)
Glucose, Bld: 89 mg/dL (ref 70–99)
Potassium: 4.3 meq/L (ref 3.5–5.1)
Sodium: 139 meq/L (ref 135–145)
Total Bilirubin: 0.4 mg/dL (ref 0.2–1.2)
Total Protein: 7.2 g/dL (ref 6.0–8.3)

## 2023-08-20 LAB — HEMOGLOBIN A1C: Hgb A1c MFr Bld: 5.9 % (ref 4.6–6.5)

## 2023-08-20 MED ORDER — TRELEGY ELLIPTA 100-62.5-25 MCG/ACT IN AEPB
INHALATION_SPRAY | RESPIRATORY_TRACT | 5 refills | Status: DC
Start: 2023-08-20 — End: 2023-08-20
  Filled 2023-08-20: qty 60, fill #0

## 2023-08-20 MED ORDER — HYDROCHLOROTHIAZIDE 25 MG PO TABS
25.0000 mg | ORAL_TABLET | Freq: Every day | ORAL | 1 refills | Status: DC
Start: 2023-08-20 — End: 2023-08-20

## 2023-08-20 MED ORDER — TRELEGY ELLIPTA 100-62.5-25 MCG/ACT IN AEPB: INHALATION_SPRAY | RESPIRATORY_TRACT | 5 refills | Status: DC

## 2023-08-20 MED ORDER — ROSUVASTATIN CALCIUM 40 MG PO TABS: 40.0000 mg | ORAL_TABLET | Freq: Every day | ORAL | 1 refills | Status: DC

## 2023-08-20 MED ORDER — ALBUTEROL SULFATE HFA 108 (90 BASE) MCG/ACT IN AERS: 2.0000 | INHALATION_SPRAY | Freq: Four times a day (QID) | RESPIRATORY_TRACT | 5 refills | Status: AC | PRN

## 2023-08-20 MED ORDER — ROSUVASTATIN CALCIUM 40 MG PO TABS
40.0000 mg | ORAL_TABLET | Freq: Every day | ORAL | 1 refills | Status: AC
  Filled 2023-08-20: qty 90, 90d supply, fill #0

## 2023-08-20 MED ORDER — TRELEGY ELLIPTA 100-62.5-25 MCG/ACT IN AEPB
INHALATION_SPRAY | RESPIRATORY_TRACT | 5 refills | Status: AC
  Filled 2023-08-20: qty 60, 30d supply, fill #0

## 2023-08-20 MED ORDER — HYDROCHLOROTHIAZIDE 25 MG PO TABS
25.0000 mg | ORAL_TABLET | Freq: Every day | ORAL | 1 refills | Status: AC
  Filled 2023-08-20 (×3): qty 90, 90d supply, fill #0

## 2023-08-20 NOTE — Assessment & Plan Note (Signed)
At baseline, continue aspirin, statin, bp and sugar management for secondary prevention.  Lab Results  Component Value Date   HGBA1C 6.2 08/07/2022

## 2023-08-20 NOTE — Patient Instructions (Addendum)
VISIT SUMMARY:  During your visit today, we discussed your asthma, hypertension, diabetes, iron deficiency, and chronic kidney disease. You reported no recent asthma attacks and your blood pressure is well controlled. You also mentioned a new symptom of waking up with a tingling sensation in your right hand. We will continue with your current medications and have planned some tests for today to check your diabetes and iron levels. As you are moving, we will refer you to a kidney specialist closer to your new home.  YOUR PLAN:  -ASTHMA: Asthma is a condition that causes your airways to become inflamed and narrow, making it hard to breathe. We will refill your Trelegy inhaler prescription and refer you to a pharmacist who can help with the cost.  -HYPERTENSION: Hypertension, or high blood pressure, is a condition where the force of blood against your artery walls is too high. Your blood pressure is well controlled, so continue taking Hydrochlorothiazide.  -DIABETES: Diabetes is a disease that affects how your body uses blood sugar. We will continue with your current management and recheck your A1C levels today.  -IRON DEFICIENCY: Iron deficiency is a condition where your body lacks enough iron, which can lead to anemia. Continue taking your iron supplements and we will recheck your iron levels today.  -CHRONIC KIDNEY DISEASE: Chronic kidney disease is a condition where your kidneys are damaged and can't filter blood as well as they should. We will refer you to a kidney specialist in the [place] area, closer to your new home.  -GENERAL HEALTH MAINTENANCE: It's important to keep up with routine health checks. Please schedule an eye exam and a mammogram, as they are currently due. We will also administer a flu shot today.  INSTRUCTIONS:  Please remember to schedule your eye exam and mammogram. We will provide you with the contact information for the eye doctor, mammogram facility, and kidney  specialist on your checkout sheet. Continue taking your current medications as prescribed. If you have any questions or concerns, don't hesitate to contact us.

## 2023-08-20 NOTE — Assessment & Plan Note (Signed)
Stable, but felt really good on Trelegy. She is interested in patient assistance. Will see if our pharmacist can help with that.

## 2023-08-20 NOTE — Assessment & Plan Note (Signed)
BP at goal on hydrochlorothiazide.  Continue same. Update labs.

## 2023-08-20 NOTE — Assessment & Plan Note (Signed)
Followed by Dr. Kathrene Bongo.  Update renal function

## 2023-08-20 NOTE — Telephone Encounter (Signed)
Appointment made to discuss med assistance with patient 08/21/2023

## 2023-08-20 NOTE — Telephone Encounter (Signed)
Patient needs help with patient assistance for Trellegy please.

## 2023-08-20 NOTE — Assessment & Plan Note (Signed)
Lab Results  Component Value Date   CHOL 145 08/07/2022   HDL 62.80 08/07/2022   LDLCALC 73 08/07/2022   TRIG 43.0 08/07/2022   CHOLHDL 2 08/07/2022   At goal last year on crestor 40mg .  Update lipids.

## 2023-08-20 NOTE — Assessment & Plan Note (Signed)
Repeat in 2020 showed resolution of nodules.

## 2023-08-20 NOTE — Assessment & Plan Note (Signed)
Update A1C today. Continue DM diet, update DM eye exam.
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
## 2023-08-20 NOTE — Assessment & Plan Note (Signed)
Wt Readings from Last 3 Encounters:  08/20/23 202 lb 9.6 oz (91.9 kg)  08/07/22 207 lb (93.9 kg)  05/07/22 199 lb 9.6 oz (90.5 kg)   Weight is down a few pounds.

## 2023-08-21 ENCOUNTER — Telehealth: Payer: Medicare HMO

## 2023-08-21 ENCOUNTER — Other Ambulatory Visit (HOSPITAL_BASED_OUTPATIENT_CLINIC_OR_DEPARTMENT_OTHER): Payer: Self-pay

## 2023-08-21 ENCOUNTER — Encounter: Payer: Self-pay | Admitting: Family

## 2023-08-21 LAB — IRON,TIBC AND FERRITIN PANEL
%SAT: 15 % — ABNORMAL LOW (ref 16–45)
Ferritin: 65 ng/mL (ref 16–288)
Iron: 44 ug/dL — ABNORMAL LOW (ref 45–160)
TIBC: 284 ug/dL (ref 250–450)

## 2023-08-21 NOTE — Telephone Encounter (Signed)
Tried to call patient today. Unable to reach her. I did check with her pharmacy and cost of Trelegy yesterday was $11.20.  I appears that Suzanne Ali has Medicare extra help which lowers branded medication copays from $47 to $11.20 per month. Because she has extra help she would not be eligible for medication assistance programs.   LM on VM with my CB# [phone] or [phone]

## 2023-08-21 NOTE — Progress Notes (Signed)
Mailed out to patient 

## 2023-08-22 ENCOUNTER — Other Ambulatory Visit: Payer: Self-pay | Admitting: Family

## 2023-08-22 DIAGNOSIS — Z1231 Encounter for screening mammogram for malignant neoplasm of breast: Secondary | ICD-10-CM

## 2023-08-22 NOTE — Telephone Encounter (Signed)
Second unsuccessful outreach to patient to review medication costs. LM on VM with CB# [phone]

## 2023-08-23 NOTE — Addendum Note (Signed)
Addended by: Mervin Kung A on: 08/23/2023 10:19 AM   Modules accepted: Orders

## 2023-09-04 ENCOUNTER — Other Ambulatory Visit (HOSPITAL_BASED_OUTPATIENT_CLINIC_OR_DEPARTMENT_OTHER): Payer: Self-pay

## 2023-11-01 ENCOUNTER — Other Ambulatory Visit (HOSPITAL_BASED_OUTPATIENT_CLINIC_OR_DEPARTMENT_OTHER): Payer: Self-pay

## 2023-12-11 LAB — HM DIABETES EYE EXAM

## 2024-01-07 LAB — HM DIABETES EYE EXAM

## 2024-02-18 ENCOUNTER — Ambulatory Visit: Payer: Medicare HMO | Admitting: Family

## 2024-05-06 ENCOUNTER — Telehealth: Payer: Self-pay | Admitting: Family

## 2024-05-06 NOTE — Telephone Encounter (Signed)
 Copied from CRM [id number]. Topic: Medicare AWV >> May 06, 2024  3:23 PM Nathanel DEL wrote: Reason for CRM: LVM 05/06/2024 to schedule AWV. Please schedule Virtual or Telehealth visits ONLY.   Nathanel Paschal; Care Guide Ambulatory Clinical Support [place] [facility], [facility] Direct Dial: [phone]
